# Patient Record
Sex: Female | Born: 1935 | Race: White | Hispanic: No | Smoking: Current every day smoker
Health system: Southern US, Community
[De-identification: ages and names within clinical notes are randomized; demographics above are authoritative.]

## PROBLEM LIST (undated history)

## (undated) DIAGNOSIS — C50919 Malignant neoplasm of unspecified site of unspecified female breast: Secondary | ICD-10-CM

## (undated) DIAGNOSIS — I714 Abdominal aortic aneurysm, without rupture, unspecified: Secondary | ICD-10-CM

## (undated) DIAGNOSIS — G2581 Restless legs syndrome: Secondary | ICD-10-CM

## (undated) DIAGNOSIS — I509 Heart failure, unspecified: Secondary | ICD-10-CM

## (undated) DIAGNOSIS — E119 Type 2 diabetes mellitus without complications: Secondary | ICD-10-CM

## (undated) DIAGNOSIS — G8929 Other chronic pain: Secondary | ICD-10-CM

## (undated) DIAGNOSIS — R7303 Prediabetes: Secondary | ICD-10-CM

## (undated) DIAGNOSIS — I739 Peripheral vascular disease, unspecified: Secondary | ICD-10-CM

## (undated) DIAGNOSIS — D649 Anemia, unspecified: Secondary | ICD-10-CM

## (undated) DIAGNOSIS — J449 Chronic obstructive pulmonary disease, unspecified: Secondary | ICD-10-CM

## (undated) DIAGNOSIS — M549 Dorsalgia, unspecified: Secondary | ICD-10-CM

## (undated) DIAGNOSIS — M1990 Unspecified osteoarthritis, unspecified site: Secondary | ICD-10-CM

## (undated) DIAGNOSIS — I10 Essential (primary) hypertension: Secondary | ICD-10-CM

## (undated) HISTORY — DX: Peripheral vascular disease, unspecified: I73.9

## (undated) HISTORY — DX: Prediabetes: R73.03

## (undated) HISTORY — DX: Other chronic pain: G89.29

## (undated) HISTORY — DX: Restless legs syndrome: G25.81

## (undated) HISTORY — DX: Anemia, unspecified: D64.9

## (undated) HISTORY — PX: TONSILLECTOMY: SUR1361

## (undated) HISTORY — DX: Abdominal aortic aneurysm, without rupture, unspecified: I71.40

## (undated) HISTORY — PX: COLONOSCOPY: SHX174

## (undated) HISTORY — DX: Chronic obstructive pulmonary disease, unspecified: J44.9

## (undated) HISTORY — PX: BREAST SURGERY: SHX581

## (undated) HISTORY — DX: Dorsalgia, unspecified: M54.9

## (undated) HISTORY — PX: MASTECTOMY: SHX3

## (undated) HISTORY — DX: Malignant neoplasm of unspecified site of unspecified female breast: C50.919

## (undated) HISTORY — DX: Unspecified osteoarthritis, unspecified site: M19.90

---
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
## 1998-01-25 ENCOUNTER — Ambulatory Visit (HOSPITAL_COMMUNITY): Admission: RE | Admit: 1998-01-25 | Discharge: 1998-01-25 | Payer: Self-pay | Admitting: Gastroenterology

## 1999-07-10 ENCOUNTER — Encounter: Payer: Self-pay | Admitting: Family Medicine

## 1999-07-10 ENCOUNTER — Encounter: Admission: RE | Admit: 1999-07-10 | Discharge: 1999-07-10 | Payer: Self-pay | Admitting: Family Medicine

## 2000-10-08 ENCOUNTER — Encounter: Admission: RE | Admit: 2000-10-08 | Discharge: 2000-10-08 | Payer: Self-pay | Admitting: Family Medicine

## 2000-10-08 ENCOUNTER — Encounter: Payer: Self-pay | Admitting: Family Medicine

## 2001-10-22 ENCOUNTER — Encounter: Admission: RE | Admit: 2001-10-22 | Discharge: 2001-10-22 | Payer: Self-pay | Admitting: Family Medicine

## 2001-10-22 ENCOUNTER — Encounter: Payer: Self-pay | Admitting: Family Medicine

## 2001-10-29 ENCOUNTER — Encounter: Payer: Self-pay | Admitting: Family Medicine

## 2001-10-29 ENCOUNTER — Encounter: Admission: RE | Admit: 2001-10-29 | Discharge: 2001-10-29 | Payer: Self-pay | Admitting: Family Medicine

## 2002-05-26 ENCOUNTER — Encounter: Admission: RE | Admit: 2002-05-26 | Discharge: 2002-05-26 | Payer: Self-pay | Admitting: Family Medicine

## 2002-05-26 ENCOUNTER — Encounter: Payer: Self-pay | Admitting: Family Medicine

## 2003-01-25 ENCOUNTER — Encounter: Admission: RE | Admit: 2003-01-25 | Discharge: 2003-01-25 | Payer: Self-pay | Admitting: Family Medicine

## 2004-06-19 ENCOUNTER — Encounter: Admission: RE | Admit: 2004-06-19 | Discharge: 2004-06-19 | Payer: Self-pay | Admitting: Family Medicine

## 2005-02-26 HISTORY — PX: MASTECTOMY: SHX3

## 2005-09-04 ENCOUNTER — Encounter: Admission: RE | Admit: 2005-09-04 | Discharge: 2005-09-04 | Payer: Self-pay | Admitting: Family Medicine

## 2005-09-13 ENCOUNTER — Encounter: Admission: RE | Admit: 2005-09-13 | Discharge: 2005-09-13 | Payer: Self-pay | Admitting: Family Medicine

## 2005-09-24 ENCOUNTER — Encounter (INDEPENDENT_AMBULATORY_CARE_PROVIDER_SITE_OTHER): Payer: Self-pay | Admitting: *Deleted

## 2005-09-24 ENCOUNTER — Encounter: Admission: RE | Admit: 2005-09-24 | Discharge: 2005-09-24 | Payer: Self-pay | Admitting: Family Medicine

## 2005-09-24 ENCOUNTER — Encounter (INDEPENDENT_AMBULATORY_CARE_PROVIDER_SITE_OTHER): Payer: Self-pay | Admitting: Diagnostic Radiology

## 2005-10-02 ENCOUNTER — Encounter: Admission: RE | Admit: 2005-10-02 | Discharge: 2005-10-02 | Payer: Self-pay | Admitting: Family Medicine

## 2005-10-30 ENCOUNTER — Encounter (INDEPENDENT_AMBULATORY_CARE_PROVIDER_SITE_OTHER): Payer: Self-pay | Admitting: Specialist

## 2005-10-30 ENCOUNTER — Ambulatory Visit (HOSPITAL_COMMUNITY): Admission: RE | Admit: 2005-10-30 | Discharge: 2005-10-31 | Payer: Self-pay | Admitting: General Surgery

## 2005-11-07 ENCOUNTER — Ambulatory Visit: Payer: Self-pay | Admitting: Hematology & Oncology

## 2006-01-13 ENCOUNTER — Ambulatory Visit: Payer: Self-pay | Admitting: Hematology & Oncology

## 2006-01-16 LAB — CBC WITH DIFFERENTIAL/PLATELET
BASO%: 0.4 % (ref 0.0–2.0)
EOS%: 0.9 % (ref 0.0–7.0)
HCT: 41.9 % (ref 34.8–46.6)
LYMPH%: 30.8 % (ref 14.0–48.0)
MCH: 32.7 pg (ref 26.0–34.0)
MCHC: 33.1 g/dL (ref 32.0–36.0)
NEUT%: 59.1 % (ref 39.6–76.8)
Platelets: 329 10*3/uL (ref 145–400)

## 2006-01-16 LAB — COMPREHENSIVE METABOLIC PANEL
ALT: 14 U/L (ref 0–35)
CO2: 32 mEq/L (ref 19–32)
Creatinine, Ser: 1.61 mg/dL — ABNORMAL HIGH (ref 0.40–1.20)
Total Bilirubin: 0.3 mg/dL (ref 0.3–1.2)

## 2006-01-23 ENCOUNTER — Encounter: Admission: RE | Admit: 2006-01-23 | Discharge: 2006-01-23 | Payer: Self-pay | Admitting: Family Medicine

## 2006-06-14 ENCOUNTER — Ambulatory Visit: Payer: Self-pay | Admitting: Hematology & Oncology

## 2006-06-19 LAB — CBC WITH DIFFERENTIAL/PLATELET
BASO%: 0.4 % (ref 0.0–2.0)
Basophils Absolute: 0 10*3/uL (ref 0.0–0.1)
Eosinophils Absolute: 0.1 10*3/uL (ref 0.0–0.5)
HCT: 41.2 % (ref 34.8–46.6)
HGB: 14.7 g/dL (ref 11.6–15.9)
LYMPH%: 25.7 % (ref 14.0–48.0)
MCHC: 35.6 g/dL (ref 32.0–36.0)
MONO#: 0.6 10*3/uL (ref 0.1–0.9)
NEUT%: 64.8 % (ref 39.6–76.8)
Platelets: 301 10*3/uL (ref 145–400)
WBC: 8.3 10*3/uL (ref 3.9–10.0)

## 2006-06-19 LAB — COMPREHENSIVE METABOLIC PANEL
BUN: 20 mg/dL (ref 6–23)
CO2: 27 mEq/L (ref 19–32)
Calcium: 9.9 mg/dL (ref 8.4–10.5)
Creatinine, Ser: 0.89 mg/dL (ref 0.40–1.20)
Glucose, Bld: 110 mg/dL — ABNORMAL HIGH (ref 70–99)
Total Bilirubin: 0.4 mg/dL (ref 0.3–1.2)

## 2006-08-13 ENCOUNTER — Ambulatory Visit: Payer: Self-pay | Admitting: Hematology & Oncology

## 2006-09-23 ENCOUNTER — Encounter: Admission: RE | Admit: 2006-09-23 | Discharge: 2006-09-23 | Payer: Self-pay | Admitting: Family Medicine

## 2006-11-13 ENCOUNTER — Ambulatory Visit: Payer: Self-pay | Admitting: Hematology & Oncology

## 2006-11-15 LAB — CBC WITH DIFFERENTIAL/PLATELET
Eosinophils Absolute: 0.1 10*3/uL (ref 0.0–0.5)
HCT: 41.2 % (ref 34.8–46.6)
HGB: 14.7 g/dL (ref 11.6–15.9)
LYMPH%: 26.8 % (ref 14.0–48.0)
MONO#: 0.6 10*3/uL (ref 0.1–0.9)
NEUT#: 5.5 10*3/uL (ref 1.5–6.5)
NEUT%: 65.1 % (ref 39.6–76.8)
Platelets: 233 10*3/uL (ref 145–400)
WBC: 8.5 10*3/uL (ref 3.9–10.0)
lymph#: 2.3 10*3/uL (ref 0.9–3.3)

## 2006-11-15 LAB — COMPREHENSIVE METABOLIC PANEL
CO2: 28 mEq/L (ref 19–32)
Calcium: 9.8 mg/dL (ref 8.4–10.5)
Chloride: 105 mEq/L (ref 96–112)
Creatinine, Ser: 0.99 mg/dL (ref 0.40–1.20)
Glucose, Bld: 169 mg/dL — ABNORMAL HIGH (ref 70–99)
Total Bilirubin: 0.5 mg/dL (ref 0.3–1.2)
Total Protein: 6.7 g/dL (ref 6.0–8.3)

## 2007-02-12 ENCOUNTER — Ambulatory Visit: Payer: Self-pay | Admitting: Hematology & Oncology

## 2007-02-14 LAB — CBC WITH DIFFERENTIAL/PLATELET
BASO%: 0.3 % (ref 0.0–2.0)
Eosinophils Absolute: 0.1 10*3/uL (ref 0.0–0.5)
LYMPH%: 26 % (ref 14.0–48.0)
MCHC: 35.5 g/dL (ref 32.0–36.0)
MONO#: 0.7 10*3/uL (ref 0.1–0.9)
NEUT#: 4.7 10*3/uL (ref 1.5–6.5)
Platelets: 271 10*3/uL (ref 145–400)
RBC: 4.15 10*6/uL (ref 3.70–5.32)
WBC: 7.5 10*3/uL (ref 3.9–10.0)
lymph#: 1.9 10*3/uL (ref 0.9–3.3)

## 2007-02-14 LAB — COMPREHENSIVE METABOLIC PANEL
ALT: 13 U/L (ref 0–35)
Albumin: 4.3 g/dL (ref 3.5–5.2)
CO2: 24 mEq/L (ref 19–32)
Calcium: 9.8 mg/dL (ref 8.4–10.5)
Chloride: 106 mEq/L (ref 96–112)
Glucose, Bld: 112 mg/dL — ABNORMAL HIGH (ref 70–99)
Potassium: 4.9 mEq/L (ref 3.5–5.3)
Sodium: 141 mEq/L (ref 135–145)
Total Bilirubin: 0.6 mg/dL (ref 0.3–1.2)
Total Protein: 6.8 g/dL (ref 6.0–8.3)

## 2007-02-27 HISTORY — PX: BACK SURGERY: SHX140

## 2007-06-12 ENCOUNTER — Encounter: Admission: RE | Admit: 2007-06-12 | Discharge: 2007-06-12 | Payer: Self-pay | Admitting: Family Medicine

## 2007-06-18 ENCOUNTER — Ambulatory Visit: Payer: Self-pay | Admitting: Hematology & Oncology

## 2007-06-20 LAB — CBC WITH DIFFERENTIAL/PLATELET
BASO%: 0.6 % (ref 0.0–2.0)
EOS%: 1 % (ref 0.0–7.0)
HCT: 41.5 % (ref 34.8–46.6)
LYMPH%: 27.6 % (ref 14.0–48.0)
MCH: 34.6 pg — ABNORMAL HIGH (ref 26.0–34.0)
MCHC: 35.7 g/dL (ref 32.0–36.0)
NEUT%: 63.7 % (ref 39.6–76.8)
Platelets: 284 10*3/uL (ref 145–400)
lymph#: 2.2 10*3/uL (ref 0.9–3.3)

## 2007-06-20 LAB — COMPREHENSIVE METABOLIC PANEL
ALT: 13 U/L (ref 0–35)
AST: 13 U/L (ref 0–37)
Creatinine, Ser: 0.87 mg/dL (ref 0.40–1.20)
Total Bilirubin: 0.5 mg/dL (ref 0.3–1.2)

## 2007-07-10 ENCOUNTER — Encounter: Admission: RE | Admit: 2007-07-10 | Discharge: 2007-07-10 | Payer: Self-pay | Admitting: Family Medicine

## 2007-08-15 ENCOUNTER — Encounter (INDEPENDENT_AMBULATORY_CARE_PROVIDER_SITE_OTHER): Payer: Self-pay | Admitting: Neurosurgery

## 2007-08-15 ENCOUNTER — Ambulatory Visit (HOSPITAL_COMMUNITY): Admission: RE | Admit: 2007-08-15 | Discharge: 2007-08-16 | Payer: Self-pay | Admitting: Neurosurgery

## 2007-09-25 ENCOUNTER — Encounter: Admission: RE | Admit: 2007-09-25 | Discharge: 2007-09-25 | Payer: Self-pay | Admitting: Surgery

## 2007-10-03 ENCOUNTER — Encounter: Admission: RE | Admit: 2007-10-03 | Discharge: 2007-10-03 | Payer: Self-pay | Admitting: Surgery

## 2007-10-16 ENCOUNTER — Ambulatory Visit: Payer: Self-pay | Admitting: Hematology & Oncology

## 2007-10-20 LAB — CBC WITH DIFFERENTIAL (CANCER CENTER ONLY)
BASO#: 0.1 10*3/uL (ref 0.0–0.2)
BASO%: 1 % (ref 0.0–2.0)
EOS%: 1.1 % (ref 0.0–7.0)
Eosinophils Absolute: 0.1 10*3/uL (ref 0.0–0.5)
HCT: 42 % (ref 34.8–46.6)
HGB: 14.4 g/dL (ref 11.6–15.9)
LYMPH#: 3.1 10*3/uL (ref 0.9–3.3)
LYMPH%: 31.8 % (ref 14.0–48.0)
MCH: 34.8 pg — ABNORMAL HIGH (ref 26.0–34.0)
MCHC: 34.3 g/dL (ref 32.0–36.0)
MCV: 101 fL (ref 81–101)
MONO#: 0.9 10*3/uL (ref 0.1–0.9)
MONO%: 8.7 % (ref 0.0–13.0)
NEUT#: 5.6 10*3/uL (ref 1.5–6.5)
NEUT%: 57.4 % (ref 39.6–80.0)
Platelets: 304 10*3/uL (ref 145–400)
RBC: 4.14 10*6/uL (ref 3.70–5.32)
RDW: 10.5 % (ref 10.5–14.6)
WBC: 9.8 10*3/uL (ref 3.9–10.0)

## 2007-10-20 LAB — COMPREHENSIVE METABOLIC PANEL
ALT: 12 U/L (ref 0–35)
AST: 10 U/L (ref 0–37)
Calcium: 9.9 mg/dL (ref 8.4–10.5)
Chloride: 104 mEq/L (ref 96–112)
Creatinine, Ser: 0.82 mg/dL (ref 0.40–1.20)
Sodium: 140 mEq/L (ref 135–145)
Total Protein: 6.5 g/dL (ref 6.0–8.3)

## 2008-01-20 ENCOUNTER — Encounter: Admission: RE | Admit: 2008-01-20 | Discharge: 2008-01-20 | Payer: Self-pay | Admitting: Family Medicine

## 2008-02-06 ENCOUNTER — Ambulatory Visit: Payer: Self-pay | Admitting: Hematology & Oncology

## 2008-02-09 LAB — CBC WITH DIFFERENTIAL (CANCER CENTER ONLY)
BASO#: 0.1 10*3/uL (ref 0.0–0.2)
HCT: 43.9 % (ref 34.8–46.6)
HGB: 14.9 g/dL (ref 11.6–15.9)
LYMPH#: 2.1 10*3/uL (ref 0.9–3.3)
LYMPH%: 23.9 % (ref 14.0–48.0)
MCHC: 33.9 g/dL (ref 32.0–36.0)
MCV: 101 fL (ref 81–101)
MONO#: 0.6 10*3/uL (ref 0.1–0.9)
NEUT%: 66.7 % (ref 39.6–80.0)
WBC: 8.8 10*3/uL (ref 3.9–10.0)

## 2008-02-09 LAB — COMPREHENSIVE METABOLIC PANEL
AST: 12 U/L (ref 0–37)
Albumin: 4.2 g/dL (ref 3.5–5.2)
BUN: 16 mg/dL (ref 6–23)
CO2: 25 mEq/L (ref 19–32)
Calcium: 9.7 mg/dL (ref 8.4–10.5)
Chloride: 105 mEq/L (ref 96–112)
Glucose, Bld: 123 mg/dL — ABNORMAL HIGH (ref 70–99)
Potassium: 4.9 mEq/L (ref 3.5–5.3)

## 2008-07-30 ENCOUNTER — Ambulatory Visit: Payer: Self-pay | Admitting: Hematology & Oncology

## 2008-08-02 LAB — COMPREHENSIVE METABOLIC PANEL
AST: 16 U/L (ref 0–37)
Albumin: 4.5 g/dL (ref 3.5–5.2)
Alkaline Phosphatase: 49 U/L (ref 39–117)
BUN: 17 mg/dL (ref 6–23)
Creatinine, Ser: 0.95 mg/dL (ref 0.40–1.20)
Glucose, Bld: 221 mg/dL — ABNORMAL HIGH (ref 70–99)
Total Bilirubin: 0.4 mg/dL (ref 0.3–1.2)

## 2008-08-02 LAB — CBC WITH DIFFERENTIAL (CANCER CENTER ONLY)
BASO%: 0.5 % (ref 0.0–2.0)
Eosinophils Absolute: 0.1 10*3/uL (ref 0.0–0.5)
HCT: 44.4 % (ref 34.8–46.6)
LYMPH#: 2.3 10*3/uL (ref 0.9–3.3)
MCV: 102 fL — ABNORMAL HIGH (ref 81–101)
MONO#: 0.5 10*3/uL (ref 0.1–0.9)
NEUT%: 57.7 % (ref 39.6–80.0)
RBC: 4.35 10*6/uL (ref 3.70–5.32)
RDW: 10.4 % — ABNORMAL LOW (ref 10.5–14.6)
WBC: 6.9 10*3/uL (ref 3.9–10.0)

## 2008-10-19 ENCOUNTER — Encounter: Admission: RE | Admit: 2008-10-19 | Discharge: 2008-10-19 | Payer: Self-pay | Admitting: Surgery

## 2008-12-02 ENCOUNTER — Encounter: Admission: RE | Admit: 2008-12-02 | Discharge: 2008-12-02 | Payer: Self-pay | Admitting: Family Medicine

## 2008-12-07 ENCOUNTER — Encounter: Admission: RE | Admit: 2008-12-07 | Discharge: 2008-12-07 | Payer: Self-pay | Admitting: Family Medicine

## 2009-02-04 ENCOUNTER — Ambulatory Visit: Payer: Self-pay | Admitting: Hematology & Oncology

## 2009-02-07 LAB — CBC WITH DIFFERENTIAL (CANCER CENTER ONLY)
BASO%: 0.6 % (ref 0.0–2.0)
Eosinophils Absolute: 0.1 10*3/uL (ref 0.0–0.5)
LYMPH#: 2 10*3/uL (ref 0.9–3.3)
LYMPH%: 23.3 % (ref 14.0–48.0)
MCV: 100 fL (ref 81–101)
MONO#: 0.5 10*3/uL (ref 0.1–0.9)
NEUT#: 5.8 10*3/uL (ref 1.5–6.5)
Platelets: 287 10*3/uL (ref 145–400)
RBC: 4.28 10*6/uL (ref 3.70–5.32)
RDW: 10.3 % — ABNORMAL LOW (ref 10.5–14.6)
WBC: 8.4 10*3/uL (ref 3.9–10.0)

## 2009-02-08 ENCOUNTER — Inpatient Hospital Stay (HOSPITAL_BASED_OUTPATIENT_CLINIC_OR_DEPARTMENT_OTHER): Admission: RE | Admit: 2009-02-08 | Discharge: 2009-02-08 | Payer: Self-pay | Admitting: Interventional Cardiology

## 2009-02-08 LAB — COMPREHENSIVE METABOLIC PANEL
AST: 12 U/L (ref 0–37)
Albumin: 4.5 g/dL (ref 3.5–5.2)
Alkaline Phosphatase: 53 U/L (ref 39–117)
Calcium: 10 mg/dL (ref 8.4–10.5)
Chloride: 105 mEq/L (ref 96–112)
Potassium: 4.5 mEq/L (ref 3.5–5.3)
Sodium: 140 mEq/L (ref 135–145)
Total Protein: 6.9 g/dL (ref 6.0–8.3)

## 2009-08-10 ENCOUNTER — Ambulatory Visit: Payer: Self-pay | Admitting: Hematology & Oncology

## 2009-08-11 LAB — CBC WITH DIFFERENTIAL (CANCER CENTER ONLY)
EOS%: 2.1 % (ref 0.0–7.0)
HGB: 14.5 g/dL (ref 11.6–15.9)
LYMPH#: 2.8 10*3/uL (ref 0.9–3.3)
MCH: 34.7 pg — ABNORMAL HIGH (ref 26.0–34.0)
MCHC: 34.5 g/dL (ref 32.0–36.0)
MCV: 101 fL (ref 81–101)
WBC: 7.7 10*3/uL (ref 3.9–10.0)

## 2009-08-12 LAB — VITAMIN D 25 HYDROXY (VIT D DEFICIENCY, FRACTURES): Vit D, 25-Hydroxy: 30 ng/mL (ref 30–89)

## 2009-08-12 LAB — COMPREHENSIVE METABOLIC PANEL
AST: 16 U/L (ref 0–37)
CO2: 27 mEq/L (ref 19–32)
Chloride: 107 mEq/L (ref 96–112)
Sodium: 143 mEq/L (ref 135–145)
Total Protein: 6.5 g/dL (ref 6.0–8.3)

## 2009-10-21 ENCOUNTER — Encounter: Admission: RE | Admit: 2009-10-21 | Discharge: 2009-10-21 | Payer: Self-pay | Admitting: Surgery

## 2009-11-07 ENCOUNTER — Encounter: Admission: RE | Admit: 2009-11-07 | Discharge: 2009-11-07 | Payer: Self-pay | Admitting: Surgery

## 2009-12-29 ENCOUNTER — Ambulatory Visit (HOSPITAL_COMMUNITY): Admission: RE | Admit: 2009-12-29 | Discharge: 2009-12-29 | Payer: Self-pay | Admitting: Gastroenterology

## 2010-01-25 ENCOUNTER — Ambulatory Visit: Payer: Self-pay | Admitting: Hematology & Oncology

## 2010-01-26 ENCOUNTER — Ambulatory Visit (HOSPITAL_BASED_OUTPATIENT_CLINIC_OR_DEPARTMENT_OTHER)
Admission: RE | Admit: 2010-01-26 | Discharge: 2010-01-26 | Payer: Self-pay | Source: Home / Self Care | Admitting: Hematology & Oncology

## 2010-03-19 ENCOUNTER — Encounter: Payer: Self-pay | Admitting: Family Medicine

## 2010-03-20 ENCOUNTER — Encounter: Payer: Self-pay | Admitting: Surgery

## 2010-05-09 LAB — GLUCOSE, CAPILLARY: Glucose-Capillary: 110 mg/dL — ABNORMAL HIGH (ref 70–99)

## 2010-06-27 ENCOUNTER — Encounter (INDEPENDENT_AMBULATORY_CARE_PROVIDER_SITE_OTHER): Payer: Self-pay | Admitting: Surgery

## 2010-07-11 NOTE — Op Note (Signed)
Linda Ray, Linda Ray                  ACCOUNT NO.:  [id number]   MEDICAL RECORD NO.:  [id number]          PATIENT TYPE:  INP   LOCATION:  3537                         FACILITY:  [facility]   PHYSICIAN:  Danae Orleans. Venetia Maxon, M.D.  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   POSTOPERATIVE DIAGNOSES:  Left L4-5 synovial cyst with spondylolisthesis  with stenosis and radiculopathy.   PROCEDURE:  Left L4-5 laminotomy with resection of synovial cyst and  microdissection.   SURGEON:  Danae Orleans. Venetia Maxon, MD   ASSISTANT:  Georgiann Cocker, RN   ANESTHESIA:  General endotracheal anesthesia.   ESTIMATED BLOOD LOSS:  Minimal.   COMPLICATIONS:  None.   DISPOSITION:  Recovery.   INDICATIONS:  Linda Ray is a 76 year old woman with left L4-5 synovial  cyst with significant radiculopathy.  It was elected to take her to  surgery for resection of this synovial cyst.   PROCEDURE IN DETAIL:  Ms. Qazi was brought to the operating room.  Following satisfactory and uncomplicated induction of general  endotracheal anesthesia and placement of intravenous lines, the patient  was placed in a prone position on the Wilson frame.  Her low back was  prepped and draped in usual sterile fashion.  Area of planned incision  was infiltrated with 0.25% Marcaine and 0.5% lidocaine with 1:200,000  epinephrine.  Incision was made in the midline, carried through the  lumbodorsal fascia and was incised sharply on the left side of midline.  Subperiosteal dissection was performed.  Marker probes were placed over  what were felt to be L3-4 and L4-5 levels, and this was confirmed on  intraoperative x-ray.  Subsequently, a hemilaminectomy of L4 was  performed with high-speed drill and completed with Kerrison rongeurs,  and there was what  appeared to be sebaceous-like material which was  expressed from the cystic degeneration of the ligamentum flavum and also  synovial cyst fluid.  The ligamentum flavum was detached and removed in  piecemeal fashion.  A generous laminotomy was performed and foraminotomy  was performed overlying the left L5 nerve root and lateral thecal sac.  Microscope was brought into field using microdissection technique.  The  lateral recess was decompressed.  Thecal sac was decompressed as well as  the lateral aspect of the nerve root.  There was a fair amount of  densely adherent cyst lining which was along the L5 nerve root, but it  was felt that the nerve root was well decompressed.  It was not  necessary to remove this in its entirety and that the nerve root was  adequately decompressed.  There was a variety of up-angled curettes were  used to remove additional degenerated ligamentous material.  The wound  was then irrigated and bathed in Depo-Medrol and fentanyl.  The  lumbodorsal fascia was closed with 0 Vicryl sutures, subcutaneous  tissues were approximated with 2-0 Vicryl interrupted inverted sutures,  and skin edges  were approximated  interrupted 3-0 Vicryl subcuticular stitch.  The wound was dressed with  Dermabond.  The patient was extubated in the operating room and taken to  recovery room in stable satisfactory condition having tolerated the  operation well.  Counts were correct at the end of the case.      Danae Orleans. Venetia Maxon, M.D.  Electronically Signed     JDS/MEDQ  D:  08/15/2007  T:  08/16/2007  Job:  [id number]

## 2010-07-14 NOTE — Op Note (Signed)
NAMEMARKISHA, MEDING                  ACCOUNT NO.:  [id number]   MEDICAL RECORD NO.:  [id number]          PATIENT TYPE:  OIB   LOCATION:  2550                         FACILITY:  [facility]   PHYSICIAN:  Rose Phi. Maple Hudson, M.D.   DATE OF BIRTH:  1934/11/26   DATE OF PROCEDURE:  10/30/2005  DATE OF DISCHARGE:                                 OPERATIVE REPORT   PREOPERATIVE DIAGNOSIS:  Stage-I carcinoma of the left breast.   POSTOPERATIVE DIAGNOSIS:  Stage-I carcinoma of the left breast.   OPERATION:  1. Blue dye injection.  2. Left axillary sentinel lymph node biopsy.  3. Left total mastectomy.   SURGEON:  Rose Phi. Young, MD.   ANESTHESIA:  General.   OPERATIVE PROCEDURE:  Prior to coming to the operating room, 1 mCi of  technetium sulfur colloid was injected intradermally.   After suitable general anesthesia was induced, the patient was placed in the  supine position with arms extended on the arm board.  Then 5 cc of a mixture  of 2 cc of methylene blue and 3 cc of injectable saline were injected in the  subareolar tissue of the left breast and the breast gently massaged for 3  minutes.  We then prepped and draped the breast and axilla.   A short transverse axillary incision was made with dissection through the  subcutaneous tissue to the clavipectoral fascia.  Deep to the fascia was 1  blue and hot lymph node.  That was submitted as a sentinel node for the  evaluation plus the OSNA study.  There were no other blue, hot or palpable  nodes.   While that was being evaluated, transverse elliptical incisions were then  outlined on the __________  breast, incorporating the nipple-areolar  complex.  Incisions were made, and with the cautery, the flaps were  dissected in the standard fashion going superiorly to near the clavicle and  medially to the medial border of the sternum and inferiorly to the  inframammary fold at the level of the rectus fascia and laterally to the  latissimus dorsi  muscle.   We then removed the breast by dissecting it from off of the pectoralis major  muscle, incorporating the pectoralis fascia.  As we got to the lateral  margin of the breast, the report from the pathologist confirmed that the  sentinel node was negative for metastatic disease.  We then terminated the  mastectomy at that level.  No axillary dissection was done.   We obtained good hemostasis using the cautery.  We thoroughly irrigated the  field with saline.  One 19-French Harrison Mons drain was inserted and brought out  through a separate stab wound.   Both incisions were then closed with staples and dressings applied, and the  patient was transferred to the recovery room in satisfactory condition  having tolerated the procedure well.      Rose Phi. Maple Hudson, M.D.  Electronically Signed     PRY/MEDQ  D:  10/30/2005  T:  10/30/2005  Job:  [id number]

## 2010-08-07 ENCOUNTER — Encounter (HOSPITAL_BASED_OUTPATIENT_CLINIC_OR_DEPARTMENT_OTHER): Payer: Medicare Other | Admitting: Hematology & Oncology

## 2010-08-07 ENCOUNTER — Other Ambulatory Visit: Payer: Self-pay | Admitting: Hematology & Oncology

## 2010-08-07 DIAGNOSIS — J410 Simple chronic bronchitis: Secondary | ICD-10-CM

## 2010-08-07 DIAGNOSIS — Z170 Estrogen receptor positive status [ER+]: Secondary | ICD-10-CM

## 2010-08-07 DIAGNOSIS — C50419 Malignant neoplasm of upper-outer quadrant of unspecified female breast: Secondary | ICD-10-CM

## 2010-08-07 DIAGNOSIS — F17200 Nicotine dependence, unspecified, uncomplicated: Secondary | ICD-10-CM

## 2010-08-07 LAB — CBC WITH DIFFERENTIAL (CANCER CENTER ONLY)
BASO#: 0 10*3/uL (ref 0.0–0.2)
Eosinophils Absolute: 0.2 10*3/uL (ref 0.0–0.5)
HGB: 14.5 g/dL (ref 11.6–15.9)
LYMPH%: 24.7 % (ref 14.0–48.0)
MCH: 33.5 pg (ref 26.0–34.0)
MCV: 97 fL (ref 81–101)
MONO#: 0.7 10*3/uL (ref 0.1–0.9)
MONO%: 9.6 % (ref 0.0–13.0)
Platelets: 251 10*3/uL (ref 145–400)
RBC: 4.33 10*6/uL (ref 3.70–5.32)
WBC: 7 10*3/uL (ref 3.9–10.0)

## 2010-08-07 LAB — COMPREHENSIVE METABOLIC PANEL
Albumin: 4.5 g/dL (ref 3.5–5.2)
Alkaline Phosphatase: 47 U/L (ref 39–117)
BUN: 19 mg/dL (ref 6–23)
CO2: 26 mEq/L (ref 19–32)
Glucose, Bld: 121 mg/dL — ABNORMAL HIGH (ref 70–99)
Potassium: 4.7 mEq/L (ref 3.5–5.3)
Sodium: 140 mEq/L (ref 135–145)
Total Bilirubin: 0.4 mg/dL (ref 0.3–1.2)
Total Protein: 6.6 g/dL (ref 6.0–8.3)

## 2010-10-02 ENCOUNTER — Other Ambulatory Visit (INDEPENDENT_AMBULATORY_CARE_PROVIDER_SITE_OTHER): Payer: Self-pay | Admitting: Surgery

## 2010-10-02 DIAGNOSIS — Z1231 Encounter for screening mammogram for malignant neoplasm of breast: Secondary | ICD-10-CM

## 2010-10-24 ENCOUNTER — Ambulatory Visit
Admission: RE | Admit: 2010-10-24 | Discharge: 2010-10-24 | Disposition: A | Discharge status: Home / Self Care | Payer: Medicare Other | Source: Ambulatory Visit | Attending: Surgery | Admitting: Surgery

## 2010-10-24 DIAGNOSIS — Z1231 Encounter for screening mammogram for malignant neoplasm of breast: Secondary | ICD-10-CM

## 2010-11-08 ENCOUNTER — Encounter (INDEPENDENT_AMBULATORY_CARE_PROVIDER_SITE_OTHER): Payer: Self-pay | Admitting: Surgery

## 2010-11-23 LAB — BASIC METABOLIC PANEL
Calcium: 10
Creatinine, Ser: 0.81
GFR calc Af Amer: >60

## 2010-11-23 LAB — CBC
RBC: 4.3
WBC: 7.3

## 2010-11-29 ENCOUNTER — Ambulatory Visit (INDEPENDENT_AMBULATORY_CARE_PROVIDER_SITE_OTHER): Payer: Self-pay | Admitting: Surgery

## 2010-12-01 ENCOUNTER — Encounter (INDEPENDENT_AMBULATORY_CARE_PROVIDER_SITE_OTHER): Payer: Self-pay | Admitting: Surgery

## 2010-12-05 ENCOUNTER — Ambulatory Visit (INDEPENDENT_AMBULATORY_CARE_PROVIDER_SITE_OTHER): Payer: Medicare Other | Admitting: Surgery

## 2010-12-05 ENCOUNTER — Encounter (INDEPENDENT_AMBULATORY_CARE_PROVIDER_SITE_OTHER): Payer: Self-pay | Admitting: Surgery

## 2010-12-05 VITALS — BP 138/80 | HR 76 | Temp 98.2°F | Resp 12 | Ht 62.0 in | Wt 132.6 lb

## 2010-12-05 DIAGNOSIS — C50919 Malignant neoplasm of unspecified site of unspecified female breast: Secondary | ICD-10-CM

## 2010-12-05 DIAGNOSIS — C50912 Malignant neoplasm of unspecified site of left female breast: Secondary | ICD-10-CM | POA: Insufficient documentation

## 2010-12-05 NOTE — Progress Notes (Signed)
Long-term followup of a T2 N0 lobular carcinoma of the left breast status post left mastectomy. She had a mammogram performed 10/26/10 which was normal. Last year she had a MRI of this breast due to some possible nipple retraction which was unremarkable. Her physical examination today shows no nodules along the left mastectomy site. The right breast shows no palpable masses. No axillary lymphadenopathy. No further nipple retraction. No palpable masses or nipple discharge.  Patient plans to followup annually with Dr. Myna Hidalgo. We will be glad to see her back on a p.r.n. basis.

## 2011-04-25 ENCOUNTER — Other Ambulatory Visit: Payer: Self-pay | Admitting: Endocrinology

## 2011-04-25 DIAGNOSIS — E041 Nontoxic single thyroid nodule: Secondary | ICD-10-CM

## 2011-05-03 ENCOUNTER — Other Ambulatory Visit: Payer: Medicare Other

## 2011-05-08 ENCOUNTER — Inpatient Hospital Stay
Admission: RE | Admit: 2011-05-08 | Discharge: 2011-05-08 | Payer: Medicare Other | Source: Ambulatory Visit | Attending: Endocrinology | Admitting: Endocrinology

## 2011-05-16 ENCOUNTER — Other Ambulatory Visit (HOSPITAL_COMMUNITY)
Admission: RE | Admit: 2011-05-16 | Discharge: 2011-05-16 | Disposition: A | Discharge status: Home / Self Care | Payer: Medicare Other | Source: Ambulatory Visit | Attending: Interventional Radiology | Admitting: Interventional Radiology

## 2011-05-16 ENCOUNTER — Ambulatory Visit
Admission: RE | Admit: 2011-05-16 | Discharge: 2011-05-16 | Disposition: A | Discharge status: Home / Self Care | Payer: Medicare Other | Source: Ambulatory Visit | Attending: Endocrinology | Admitting: Endocrinology

## 2011-05-16 DIAGNOSIS — E049 Nontoxic goiter, unspecified: Secondary | ICD-10-CM | POA: Insufficient documentation

## 2011-05-16 DIAGNOSIS — E041 Nontoxic single thyroid nodule: Secondary | ICD-10-CM

## 2011-08-07 ENCOUNTER — Telehealth: Payer: Self-pay | Admitting: Hematology & Oncology

## 2011-08-07 NOTE — Telephone Encounter (Signed)
Pt called and cx 08/08/11 apt and stated she will call back to resch

## 2011-08-08 ENCOUNTER — Ambulatory Visit: Payer: Medicare Other | Admitting: Hematology & Oncology

## 2011-12-27 ENCOUNTER — Other Ambulatory Visit (INDEPENDENT_AMBULATORY_CARE_PROVIDER_SITE_OTHER): Payer: Self-pay | Admitting: Surgery

## 2011-12-27 DIAGNOSIS — Z1231 Encounter for screening mammogram for malignant neoplasm of breast: Secondary | ICD-10-CM

## 2011-12-27 DIAGNOSIS — Z9012 Acquired absence of left breast and nipple: Secondary | ICD-10-CM

## 2011-12-27 DIAGNOSIS — Z853 Personal history of malignant neoplasm of breast: Secondary | ICD-10-CM

## 2012-02-04 ENCOUNTER — Ambulatory Visit
Admission: RE | Admit: 2012-02-04 | Discharge: 2012-02-04 | Disposition: A | Discharge status: Home / Self Care | Payer: Medicare Other | Source: Ambulatory Visit | Attending: Surgery | Admitting: Surgery

## 2012-02-04 DIAGNOSIS — Z853 Personal history of malignant neoplasm of breast: Secondary | ICD-10-CM

## 2012-02-04 DIAGNOSIS — Z9012 Acquired absence of left breast and nipple: Secondary | ICD-10-CM

## 2012-02-04 DIAGNOSIS — Z1231 Encounter for screening mammogram for malignant neoplasm of breast: Secondary | ICD-10-CM

## 2012-04-23 ENCOUNTER — Other Ambulatory Visit: Payer: Self-pay | Admitting: Endocrinology

## 2012-04-23 DIAGNOSIS — E049 Nontoxic goiter, unspecified: Secondary | ICD-10-CM

## 2012-04-28 ENCOUNTER — Other Ambulatory Visit: Payer: Medicare Other

## 2012-04-30 ENCOUNTER — Ambulatory Visit
Admission: RE | Admit: 2012-04-30 | Discharge: 2012-04-30 | Disposition: A | Discharge status: Home / Self Care | Payer: Medicare Other | Source: Ambulatory Visit | Attending: Endocrinology | Admitting: Endocrinology

## 2012-04-30 DIAGNOSIS — E049 Nontoxic goiter, unspecified: Secondary | ICD-10-CM

## 2013-01-08 ENCOUNTER — Other Ambulatory Visit: Payer: Self-pay

## 2013-01-08 DIAGNOSIS — Z1231 Encounter for screening mammogram for malignant neoplasm of breast: Secondary | ICD-10-CM

## 2013-01-08 DIAGNOSIS — Z9012 Acquired absence of left breast and nipple: Secondary | ICD-10-CM

## 2013-01-08 DIAGNOSIS — Z853 Personal history of malignant neoplasm of breast: Secondary | ICD-10-CM

## 2013-02-12 ENCOUNTER — Ambulatory Visit
Admission: RE | Admit: 2013-02-12 | Discharge: 2013-02-12 | Disposition: A | Discharge status: Home / Self Care | Payer: Medicare Other | Source: Ambulatory Visit

## 2013-02-12 DIAGNOSIS — Z853 Personal history of malignant neoplasm of breast: Secondary | ICD-10-CM

## 2013-02-12 DIAGNOSIS — Z1231 Encounter for screening mammogram for malignant neoplasm of breast: Secondary | ICD-10-CM

## 2013-02-12 DIAGNOSIS — Z9012 Acquired absence of left breast and nipple: Secondary | ICD-10-CM

## 2013-02-20 ENCOUNTER — Other Ambulatory Visit: Payer: Self-pay | Admitting: Nurse Practitioner

## 2013-02-20 DIAGNOSIS — R928 Other abnormal and inconclusive findings on diagnostic imaging of breast: Secondary | ICD-10-CM

## 2013-02-24 ENCOUNTER — Ambulatory Visit
Admission: RE | Admit: 2013-02-24 | Discharge: 2013-02-24 | Disposition: A | Discharge status: Home / Self Care | Payer: Medicare Other | Source: Ambulatory Visit | Attending: Nurse Practitioner | Admitting: Nurse Practitioner

## 2013-02-24 DIAGNOSIS — R928 Other abnormal and inconclusive findings on diagnostic imaging of breast: Secondary | ICD-10-CM

## 2013-04-28 ENCOUNTER — Other Ambulatory Visit: Payer: Self-pay | Admitting: Endocrinology

## 2013-04-28 DIAGNOSIS — E049 Nontoxic goiter, unspecified: Secondary | ICD-10-CM

## 2013-05-01 ENCOUNTER — Ambulatory Visit
Admission: RE | Admit: 2013-05-01 | Discharge: 2013-05-01 | Disposition: A | Discharge status: Home / Self Care | Payer: Medicare Other | Source: Ambulatory Visit | Attending: Endocrinology | Admitting: Endocrinology

## 2013-05-01 DIAGNOSIS — E049 Nontoxic goiter, unspecified: Secondary | ICD-10-CM

## 2013-05-04 ENCOUNTER — Other Ambulatory Visit: Payer: Self-pay | Admitting: Endocrinology

## 2013-05-04 DIAGNOSIS — E049 Nontoxic goiter, unspecified: Secondary | ICD-10-CM

## 2013-07-01 ENCOUNTER — Other Ambulatory Visit: Payer: Self-pay | Admitting: Endocrinology

## 2013-07-01 DIAGNOSIS — E041 Nontoxic single thyroid nodule: Secondary | ICD-10-CM

## 2013-07-08 ENCOUNTER — Other Ambulatory Visit (HOSPITAL_COMMUNITY)
Admission: RE | Admit: 2013-07-08 | Discharge: 2013-07-08 | Disposition: A | Discharge status: Home / Self Care | Payer: Medicare Other | Source: Ambulatory Visit | Attending: Interventional Radiology | Admitting: Interventional Radiology

## 2013-07-08 ENCOUNTER — Ambulatory Visit
Admission: RE | Admit: 2013-07-08 | Discharge: 2013-07-08 | Disposition: A | Discharge status: Home / Self Care | Payer: Medicare Other | Source: Ambulatory Visit | Attending: Endocrinology | Admitting: Endocrinology

## 2013-07-08 DIAGNOSIS — E041 Nontoxic single thyroid nodule: Secondary | ICD-10-CM

## 2013-07-25 LAB — PULMONARY FUNCTION TEST
DL/VA % pred: 114 %
DL/VA: 5.2 ml/min/mmHg/L
DLCO COR: 14.67 ml/min/mmHg
DLCO UNC % PRED: 66 %
DLCO UNC: 14.35 ml/min/mmHg
DLCO cor % pred: 68 %
FEF 25-75 Post: 1.05 L/sec
FEF 25-75 Pre: 0.87 L/sec
FEF2575-%Change-Post: 20 %
FEF2575-%PRED-POST: 78 %
FEF2575-%PRED-PRE: 65 %
FEV1-%CHANGE-POST: 4 %
FEV1-%PRED-PRE: 67 %
FEV1-%Pred-Post: 69 %
FEV1-POST: 1.24 L
FEV1-PRE: 1.19 L
FEV1FVC-%CHANGE-POST: 3 %
FEV1FVC-%PRED-PRE: 100 %
FEV6-%Change-Post: 1 %
FEV6-%PRED-PRE: 70 %
FEV6-%Pred-Post: 71 %
FEV6-POST: 1.62 L
FEV6-Pre: 1.59 L
FEV6FVC-%Change-Post: 1 %
FEV6FVC-%PRED-POST: 106 %
FEV6FVC-%Pred-Pre: 105 %
FVC-%Change-Post: 0 %
FVC-%PRED-PRE: 67 %
FVC-%Pred-Post: 67 %
FVC-PRE: 1.61 L
FVC-Post: 1.62 L
POST FEV6/FVC RATIO: 100 %
PRE FEV6/FVC RATIO: 99 %
Post FEV1/FVC ratio: 77 %
Pre FEV1/FVC ratio: 74 %
RV % PRED: 86 %
RV: 1.98 L
TLC % pred: 81 %
TLC: 3.86 L

## 2013-09-26 ENCOUNTER — Encounter: Payer: Self-pay | Admitting: *Deleted

## 2013-11-03 ENCOUNTER — Other Ambulatory Visit: Payer: Medicare Other

## 2014-02-15 ENCOUNTER — Other Ambulatory Visit: Payer: Self-pay

## 2014-02-15 DIAGNOSIS — Z1231 Encounter for screening mammogram for malignant neoplasm of breast: Secondary | ICD-10-CM

## 2014-02-15 DIAGNOSIS — Z9012 Acquired absence of left breast and nipple: Secondary | ICD-10-CM

## 2014-03-10 ENCOUNTER — Ambulatory Visit
Admission: RE | Admit: 2014-03-10 | Discharge: 2014-03-10 | Disposition: A | Discharge status: Home / Self Care | Payer: Medicare Other | Source: Ambulatory Visit

## 2014-03-10 DIAGNOSIS — Z1231 Encounter for screening mammogram for malignant neoplasm of breast: Secondary | ICD-10-CM

## 2014-03-10 DIAGNOSIS — Z9012 Acquired absence of left breast and nipple: Secondary | ICD-10-CM

## 2014-04-29 ENCOUNTER — Other Ambulatory Visit: Payer: Self-pay | Admitting: Endocrinology

## 2014-04-29 DIAGNOSIS — E049 Nontoxic goiter, unspecified: Secondary | ICD-10-CM

## 2014-07-12 ENCOUNTER — Ambulatory Visit
Admission: RE | Admit: 2014-07-12 | Discharge: 2014-07-12 | Disposition: A | Discharge status: Home / Self Care | Payer: Medicare Other | Source: Ambulatory Visit | Attending: Endocrinology | Admitting: Endocrinology

## 2014-07-12 DIAGNOSIS — E049 Nontoxic goiter, unspecified: Secondary | ICD-10-CM

## 2015-03-01 ENCOUNTER — Other Ambulatory Visit: Payer: Self-pay

## 2015-03-01 DIAGNOSIS — Z1231 Encounter for screening mammogram for malignant neoplasm of breast: Secondary | ICD-10-CM

## 2015-03-22 ENCOUNTER — Ambulatory Visit
Admission: RE | Admit: 2015-03-22 | Discharge: 2015-03-22 | Disposition: A | Discharge status: Home / Self Care | Payer: Medicare Other | Source: Ambulatory Visit

## 2015-03-22 DIAGNOSIS — Z1231 Encounter for screening mammogram for malignant neoplasm of breast: Secondary | ICD-10-CM

## 2015-03-23 ENCOUNTER — Other Ambulatory Visit: Payer: Self-pay | Admitting: Nurse Practitioner

## 2015-03-23 DIAGNOSIS — Z9012 Acquired absence of left breast and nipple: Secondary | ICD-10-CM

## 2015-03-23 DIAGNOSIS — N6459 Other signs and symptoms in breast: Secondary | ICD-10-CM

## 2015-03-29 ENCOUNTER — Ambulatory Visit
Admission: RE | Admit: 2015-03-29 | Discharge: 2015-03-29 | Disposition: A | Discharge status: Home / Self Care | Payer: Medicare Other | Source: Ambulatory Visit | Attending: Nurse Practitioner | Admitting: Nurse Practitioner

## 2015-03-29 DIAGNOSIS — N6459 Other signs and symptoms in breast: Secondary | ICD-10-CM

## 2015-03-29 DIAGNOSIS — Z9012 Acquired absence of left breast and nipple: Secondary | ICD-10-CM

## 2015-05-25 ENCOUNTER — Institutional Professional Consult (permissible substitution): Payer: Medicare Other | Admitting: Internal Medicine

## 2015-05-31 ENCOUNTER — Encounter: Payer: Self-pay | Admitting: Internal Medicine

## 2015-05-31 ENCOUNTER — Ambulatory Visit (INDEPENDENT_AMBULATORY_CARE_PROVIDER_SITE_OTHER): Payer: Medicare Other | Admitting: Internal Medicine

## 2015-05-31 VITALS — BP 130/72 | HR 94 | Ht 61.0 in | Wt 127.2 lb

## 2015-05-31 DIAGNOSIS — R911 Solitary pulmonary nodule: Secondary | ICD-10-CM | POA: Diagnosis not present

## 2015-05-31 DIAGNOSIS — Z720 Tobacco use: Secondary | ICD-10-CM | POA: Diagnosis not present

## 2015-05-31 DIAGNOSIS — Z87891 Personal history of nicotine dependence: Secondary | ICD-10-CM

## 2015-05-31 DIAGNOSIS — R5383 Other fatigue: Secondary | ICD-10-CM | POA: Diagnosis not present

## 2015-05-31 NOTE — Progress Notes (Signed)
Subjective:    Patient ID: Linda Ray, female    DOB: 1934/07/20, 81 y.o.   MRN: [id number] PCP Vicenta Aly, FNP  HPI IOV 05/31/2015  Chief Complaint  Patient presents with  . Pulmonary Consult    Pt referred by Dr. Melford Aase for COPD. Pt states she has intermittent DOE, prod cough with clear mucus. Pt denies CP/tightness.   hx from patient and outside chart  81 year old female lives in [place]. Takes care of her 20 year old mother. She visits with Vicenta Aly of the primary care clinic there. She generally tries to avoid scheduled maintenance visits with primary care. She only sees them acutely. As best as I can gather patient has some nonspecific fatigue. Unclear if it is even worse with exertion or relieved by rest. She does not admit any shortness of breath or cough. She is a heavy smoker. However she does admit to symptoms are consistent with a COPD exacerbation/acute bronchitis. She takes antibodies and prednisone for this at least once a year. This is being her life so for the last few years. She is able to do all her activities of daily living. Somewhere along the way she had a CT scan of the chest. I was able to visualize the CT chest from 04/01/2015 done at [facility]. The official report is that is a 6 mm right lower lobe nodule that has enlarged but there are other smaller nodules are stable. Patient has been referred hereto address his smoking, lung nodule and a fatigue. She says that she is to be taken care of her 8 year old mom. She does not have a tire make frequent trips here. She likes to keep her visits here at a minimum. She is willing to have pulmonary function test and follow-up but only when clubbed with her CT chest in 3 months  Though she denies symptoms when I asked her to do cAT score it looks like she has cough, and fatigue   CAT COPD Symptom & Quality of Life Score ([place] trademark) 0 is no burden. 5 is highest burden 05/31/2015   Never Cough ->  Cough all the time 4  No phlegm in chest -> Chest is full of phlegm 2  No chest tightness -> Chest feels very tight 3  No dyspnea for 1 flight stairs/hill -> Very dyspneic for 1 flight of stairs 3  No limitations for ADL at home -> Very limited with ADL at home 1  Confident leaving home -> Not at all confident leaving home 0  Sleep soundly -> Do not sleep soundly because of lung condition 1  Lots of Energy -> No energy at all 4  TOTAL Score (max 40)  18       has a past medical history of Arthritis; Chronic back pain; RLS (restless legs syndrome); Breast cancer ([place]); and Borderline diabetes.   reports that she has been smoking Cigarettes.  She has a 81.25 pack-year smoking history. She has never used smokeless tobacco.  Past Surgical History  Procedure Laterality Date  . Tonsillectomy    . Abdominal surgery  1954  . Mastectomy  2007  . Back surgery  2009    Allergies  Allergen Reactions  . Codeine   . Diphth-Acell Pertussis-Tetanus Other (See Comments)    Significant local reaction  . Sulfa Drugs Cross Reactors     Immunization History  Administered Date(s) Administered  . Influenza,inj,Quad PF,36+ Mos 11/27/2014  . Pneumococcal Polysaccharide-23 02/26/1998    Family History  Problem Relation  Age of Onset  . Cancer Mother     colon  . Stroke Father   . Cancer Sister     breast, bladder, kidney     Current outpatient prescriptions:  .  albuterol (PROVENTIL HFA;VENTOLIN HFA) 108 (90 Base) MCG/ACT inhaler, Inhale 1-2 puffs into the lungs every 6 (six) hours as needed for wheezing or shortness of breath., Disp: , Rfl:  .  aspirin 81 MG tablet, Take 81 mg by mouth daily.  , Disp: , Rfl:  .  B Complex-C (B-COMPLEX WITH VITAMIN C) tablet, Take 1 tablet by mouth daily., Disp: , Rfl:  .  Cholecalciferol (VITAMIN D) 2000 UNITS tablet, Take 2,000 Units by mouth daily.  , Disp: , Rfl:      Review of Systems  Constitutional: Negative for fever and unexpected weight  change.  HENT: Negative for congestion, dental problem, ear pain, nosebleeds, postnasal drip, rhinorrhea, sinus pressure, sneezing, sore throat and trouble swallowing.   Eyes: Negative for redness and itching.  Respiratory: Positive for cough and shortness of breath. Negative for chest tightness and wheezing.   Cardiovascular: Negative for palpitations and leg swelling.  Gastrointestinal: Negative for nausea and vomiting.  Genitourinary: Negative for dysuria.  Musculoskeletal: Negative for joint swelling.  Skin: Negative for rash.  Neurological: Negative for headaches.  Hematological: Does not bruise/bleed easily.  Psychiatric/Behavioral: Negative for dysphoric mood. The patient is not nervous/anxious.        Objective:   Physical Exam  Constitutional: She is oriented to person, place, and time. She appears well-developed and well-nourished. No distress.  HENT:  Head: Normocephalic and atraumatic.  Right Ear: External ear normal.  Left Ear: External ear normal.  Mouth/Throat: Oropharynx is clear and moist. No oropharyngeal exudate.  Eyes: Conjunctivae and EOM are normal. Pupils are equal, round, and reactive to light. Right eye exhibits no discharge. Left eye exhibits no discharge. No scleral icterus.  Neck: Normal range of motion. Neck supple. No JVD present. No tracheal deviation present. No thyromegaly present.  Cardiovascular: Normal rate, regular rhythm, normal heart sounds and intact distal pulses.  Exam reveals no gallop and no friction rub.   No murmur heard. Pulmonary/Chest: Effort normal and breath sounds normal. No respiratory distress. She has no wheezes. She has no rales. She exhibits no tenderness.  Abdominal: Soft. Bowel sounds are normal. She exhibits no distension and no mass. There is no tenderness. There is no rebound and no guarding.  Musculoskeletal: Normal range of motion. She exhibits no edema or tenderness.  Lymphadenopathy:    She has no cervical adenopathy.    Neurological: She is alert and oriented to person, place, and time. She has normal reflexes. No cranial nerve deficit. She exhibits normal muscle tone. Coordination normal.  Skin: Skin is warm and dry. No rash noted. She is not diaphoretic. No erythema. No pallor.  Psychiatric: She has a normal mood and affect. Her behavior is normal. Judgment and thought content normal.  Vitals reviewed.   Filed Vitals:   05/31/15 1438  BP: 130/72  Pulse: 94  Height: 5\' 1"  (1.549 m)  Weight: 127 lb 3.2 oz (57.698 kg)  SpO2: 97%         Assessment & Plan:     ICD-9-CM ICD-10-CM   1. Smoking history V15.82 Z72.0   2. Incidental lung nodule, > 75mm and < 18mm 793.11 R91.1   3. Other fatigue 780.79 R53.83      #smoking  -  we will work on quitting smoking -  do full PFT may 2017  #lung nodule - 69mm Feb 2017 right lower lobe and grown from prior  - do ct chest - early to mid may 2017  #fatigue - could be related to copd - do  PFT May 2017 - do CAT score 05/31/2015  #Followup  - after PFT and CT May 2017   Dr. Brand Males, M.D., F.C.C.P Pulmonary and Critical Care Medicine Staff Physician [facility] Pager: [phone], If no answer or between  15:00h - 7:00h: call [phone]  05/31/2015 3:05 PM

## 2015-05-31 NOTE — Patient Instructions (Signed)
ICD-9-CM ICD-10-CM   1. Smoking history V15.82 Z72.0   2. Incidental lung nodule, > 57mm and < 70mm 793.11 R91.1   3. Other fatigue 780.79 R53.83    #smoking  = we will work on quitting smoking - do full PFT may 2017  #lung nodule - 44mm Feb 2017 right lower lobe and grown from prior  - do ct chest - early to mid may 2017  #fatigue - could be related to copd - do  PFT May 2017 - do CAT score 05/31/2015  #Followup  - after PFT and CT May 2017

## 2015-07-13 ENCOUNTER — Ambulatory Visit (INDEPENDENT_AMBULATORY_CARE_PROVIDER_SITE_OTHER)
Admission: RE | Admit: 2015-07-13 | Discharge: 2015-07-13 | Disposition: A | Discharge status: Home / Self Care | Payer: Medicare Other | Source: Ambulatory Visit | Attending: Internal Medicine | Admitting: Internal Medicine

## 2015-07-13 DIAGNOSIS — R911 Solitary pulmonary nodule: Secondary | ICD-10-CM

## 2015-07-13 DIAGNOSIS — Z87891 Personal history of nicotine dependence: Secondary | ICD-10-CM

## 2015-07-22 ENCOUNTER — Telehealth: Payer: Self-pay | Admitting: Internal Medicine

## 2015-07-22 NOTE — Telephone Encounter (Signed)
Seeing her 07/27/15 - nodule stable. Will discuss 53/1/17 visit. You can call iher if you have time interim  IMPRESSION: 1. Stable 6 by 4 mm pleural-based nodule in the right lower lobe, image 75/3. We have now documented 3 months of stability. No follow-up needed if patient is low-risk. Non-contrast chest CT can be considered in 9 months if patient is high-risk. This recommendation follows the consensus statement: Guidelines for Management of Incidental Pulmonary Nodules Detected on CT Images:From the Fleischner Society 2017; published online before print (10.1148/radiol.IJ:2314499). 2. Paraseptal emphysema. Faint chronic ground-glass opacities in the right lower lobe. 3. Thoracic spondylosis. 4. Possible mild osseous foraminal stenosis on the left at T1-2 due to spurring. 5. Coronary, aortic arch, and branch vessel atherosclerotic vascular disease. 6. There is some mild soft tissue density surrounding the trachea, possibly from remote inflammation, chronic. 7. Left mastectomy   Electronically Signed By: Van Clines M.D. On: 07/13/2015 13:39

## 2015-07-26 ENCOUNTER — Ambulatory Visit (INDEPENDENT_AMBULATORY_CARE_PROVIDER_SITE_OTHER): Payer: Medicare Other | Admitting: Internal Medicine

## 2015-07-26 DIAGNOSIS — Z87891 Personal history of nicotine dependence: Secondary | ICD-10-CM

## 2015-07-26 DIAGNOSIS — Z720 Tobacco use: Secondary | ICD-10-CM

## 2015-07-26 DIAGNOSIS — R911 Solitary pulmonary nodule: Secondary | ICD-10-CM

## 2015-07-26 NOTE — Progress Notes (Signed)
PFT done today. 

## 2015-07-27 ENCOUNTER — Encounter: Payer: Self-pay | Admitting: Internal Medicine

## 2015-07-27 ENCOUNTER — Ambulatory Visit (INDEPENDENT_AMBULATORY_CARE_PROVIDER_SITE_OTHER): Payer: Medicare Other | Admitting: Internal Medicine

## 2015-07-27 VITALS — BP 124/62 | HR 103 | Ht 61.0 in | Wt 126.0 lb

## 2015-07-27 DIAGNOSIS — Z87891 Personal history of nicotine dependence: Secondary | ICD-10-CM

## 2015-07-27 DIAGNOSIS — R911 Solitary pulmonary nodule: Secondary | ICD-10-CM

## 2015-07-27 DIAGNOSIS — J449 Chronic obstructive pulmonary disease, unspecified: Secondary | ICD-10-CM | POA: Insufficient documentation

## 2015-07-27 DIAGNOSIS — Z720 Tobacco use: Secondary | ICD-10-CM

## 2015-07-27 MED ORDER — TIOTROPIUM BROMIDE MONOHYDRATE 1.25 MCG/ACT IN AERS: 1.0000 | INHALATION_SPRAY | Freq: Every day | RESPIRATORY_TRACT | Status: DC

## 2015-07-27 NOTE — Progress Notes (Signed)
Subjective:     Patient ID: Linda Ray, female   DOB: 1934-10-30, 81 y.o.   MRN: [id number]  HPI IOV 05/31/2015  Chief Complaint  Patient presents with  . Pulmonary Consult    Pt referred by Dr. Melford Aase for COPD. Pt states she has intermittent DOE, prod cough with clear mucus. Pt denies CP/tightness.   hx from patient and outside chart  81 year old female lives in [place]. Takes care of her 5 year old mother. She visits with Vicenta Aly of the primary care clinic there. She generally tries to avoid scheduled maintenance visits with primary care. She only sees them acutely. As best as I can gather patient has some nonspecific fatigue. Unclear if it is even worse with exertion or relieved by rest. She does not admit any shortness of breath or cough. She is a heavy smoker. However she does admit to symptoms are consistent with a COPD exacerbation/acute bronchitis. She takes antibioptics and prednisone for this at least once a year. This is being her life so for the last few years. She is able to do all her activities of daily living. Somewhere along the way she had a CT scan of the chest. I was able to visualize the CT chest from 04/01/2015 done at [facility]. The official report is that is a 6 mm right lower lobe nodule that has enlarged but there are other smaller nodules are stable. Patient has been referred hereto address his smoking, lung nodule and a fatigue. She says that she is to be taken care of her 58 year old mom. She does not have a time make frequent trips here. She likes to keep her visits here at a minimum. She is willing to have pulmonary function test and follow-up but only when clubbed with her CT chest in 3 months  Though she denies symptoms when I asked her to do cAT score it looks like she has cough, and fatigue   OV 07/27/2015  Chief Complaint  Patient presents with  . Follow-up    PFT done. Pt c/o cough with clear mucus, some wheeze and SOB with exertion. Pt  denies CP/tightness.    Here to erviewe rresults. No new issues. Wants to limit visits here due to her need to take care of her 23 year old mom  Pulmonary function test 07/26/2015 shows FVC 1.6 L/67%, FEV1 1.2 L/67% and a ratio of 74. Postbronchodilator FEV1 is 1.24 L/64%. Total lung capacity is 3.86/81%. DLCO is 14.67/68%. Although the spirometry suggests restriction with a low diffusion capacity the clinical finding on the CT scan is paraseptal emphysema   Still smokes  CAT COPD Symptom & Quality of Life Score ([place]) 0 is no burden. 5 is highest burden 05/31/2015   Never Cough -> Cough all the time 4  No phlegm in chest -> Chest is full of phlegm 2  No chest tightness -> Chest feels very tight 3  No dyspnea for 1 flight stairs/hill -> Very dyspneic for 1 flight of stairs 3  No limitations for ADL at home -> Very limited with ADL at home 1  Confident leaving home -> Not at all confident leaving home 0  Sleep soundly -> Do not sleep soundly because of lung condition 1  Lots of Energy -> No energy at all 4  TOTAL Score (max 40)  18     IMPRESSION: 1. Stable 6 by 4 mm pleural-based nodule in the right lower lobe, image 75/3. We have now documented 3 months of  stability. No follow-up needed if patient is low-risk. Non-contrast chest CT can be considered in 9 months if patient is high-risk. This recommendation follows the consensus statement: Guidelines for Management of Incidental Pulmonary Nodules Detected on CT Images:From the Fleischner Society 2017; published online before print (10.1148/radiol.IJ:2314499). 2. Paraseptal emphysema. Faint chronic ground-glass opacities in the right lower lobe. 3. Thoracic spondylosis. 4. Possible mild osseous foraminal stenosis on the left at T1-2 due to spurring. 5. Coronary, aortic arch, and branch vessel atherosclerotic vascular disease. 6. There is some mild soft tissue density surrounding the trachea, possibly from remote  inflammation, chronic. 7. Left mastectomy   Electronically Signed By: Van Clines M.D. On: 07/13/2015 13:39                   has a past medical history of Arthritis; Chronic back pain; RLS (restless legs syndrome); Breast cancer ([place]); and Borderline diabetes.   reports that she has been smoking Cigarettes.  She has a 81.25 pack-year smoking history. She has never used smokeless tobacco.  Past Surgical History  Procedure Laterality Date  . Tonsillectomy    . Abdominal surgery  1954  . Mastectomy  2007  . Back surgery  2009    Allergies  Allergen Reactions  . Codeine   . Diphth-Acell Pertussis-Tetanus Other (See Comments)    Significant local reaction  . Sulfa Drugs Cross Reactors     Immunization History  Administered Date(s) Administered  . Influenza,inj,Quad PF,36+ Mos 11/27/2014  . Pneumococcal Polysaccharide-23 02/26/1998    Family History  Problem Relation Age of Onset  . Cancer Mother     colon  . Stroke Father   . Cancer Sister     breast, bladder, kidney     Current outpatient prescriptions:  .  albuterol (PROVENTIL HFA;VENTOLIN HFA) 108 (90 Base) MCG/ACT inhaler, Inhale 1-2 puffs into the lungs every 6 (six) hours as needed for wheezing or shortness of breath., Disp: , Rfl:  .  aspirin 81 MG tablet, Take 81 mg by mouth daily.  , Disp: , Rfl:  .  B Complex-C (B-COMPLEX WITH VITAMIN C) tablet, Take 1 tablet by mouth daily., Disp: , Rfl:  .  Cholecalciferol (VITAMIN D) 2000 UNITS tablet, Take 2,000 Units by mouth daily.  , Disp: , Rfl:     Review of Systems     Objective:   Physical Exam  Constitutional: She is oriented to person, place, and time. She appears well-developed and well-nourished. No distress.  HENT:  Head: Normocephalic and atraumatic.  Right Ear: External ear normal.  Left Ear: External ear normal.  Mouth/Throat: Oropharynx is clear and moist. No oropharyngeal exudate.  Eyes: Conjunctivae and EOM are normal.  Pupils are equal, round, and reactive to light. Right eye exhibits no discharge. Left eye exhibits no discharge. No scleral icterus.  Neck: Normal range of motion. Neck supple. No JVD present. No tracheal deviation present. No thyromegaly present.  Cardiovascular: Normal rate, regular rhythm, normal heart sounds and intact distal pulses.  Exam reveals no gallop and no friction rub.   No murmur heard. Pulmonary/Chest: Effort normal and breath sounds normal. No respiratory distress. She has no wheezes. She has no rales. She exhibits no tenderness.  Abdominal: Soft. Bowel sounds are normal. She exhibits no distension and no mass. There is no tenderness. There is no rebound and no guarding.  Musculoskeletal: Normal range of motion. She exhibits no edema or tenderness.  Lymphadenopathy:    She has no cervical adenopathy.  Neurological: She  is alert and oriented to person, place, and time. She has normal reflexes. No cranial nerve deficit. She exhibits normal muscle tone. Coordination normal.  Skin: Skin is warm and dry. No rash noted. She is not diaphoretic. No erythema. No pallor.  Psychiatric: She has a normal mood and affect. Her behavior is normal. Judgment and thought content normal.  Vitals reviewed.   Filed Vitals:   07/27/15 1434  BP: 124/62  Pulse: 103  Height: 5\' 1"  (1.549 m)  Weight: 126 lb (57.153 kg)  SpO2: 94%        Assessment:       ICD-9-CM ICD-10-CM   1. Incidental lung nodule, > 85mm and < 40mm 793.11 R91.1 CT Chest Wo Contrast  2. Smoking history V15.82 Z72.0   3. COPD, moderate (HCC) 496 J44.9 Pulmonary Function Test       Plan:      #lung nodule - stable feb to may 2017  - rep[eat ct chest wo contrast in 1 year  @smoking   - please quit  #moderate copd  = start spiriva respimat daily  - use albuterol as needed - flu shot in fall   #Followup 12 months with repeat PFT Call sooner if any side effects with spiriva   > 50% of this > 25 min visit spent  in face to face counseling or coordination of care    Dr. Brand Males, M.D., [facility].C.P Pulmonary and Critical Care Medicine Staff Physician [facility] Pager: [phone], If no answer or between  15:00h - 7:00h: call [phone]  07/27/2015 3:11 PM

## 2015-07-27 NOTE — Patient Instructions (Signed)
ICD-9-CM ICD-10-CM   1. Incidental lung nodule, > 65mm and < 51mm 793.11 R91.1   2. Smoking history V15.82 Z72.0   3. COPD, moderate (HCC) 496 J44.9     #lung nodule - stable feb to may 2017  - rep[eat ct chest wo contrast in 1 year  @smoking   - please quit  #moderate copd  = start spiriva respimat daily  - use albuterol as needed - flu shot in fall   #Followup 12 months with repeat PFT Call sooner if any side effects with spiriva

## 2015-07-27 NOTE — Progress Notes (Signed)
Patient ID: Linda Ray, female   DOB: [birth year], 81 y.o.   MRN: [id number] Patient seen in the office today and instructed on use of Spiriva Respimat.  Patient expressed understanding and demonstrated technique.

## 2015-07-27 NOTE — Telephone Encounter (Signed)
This was discussed in office visit.  Nothing further needed.

## 2016-02-23 ENCOUNTER — Other Ambulatory Visit: Payer: Self-pay | Admitting: Endocrinology

## 2016-02-23 DIAGNOSIS — E049 Nontoxic goiter, unspecified: Secondary | ICD-10-CM

## 2016-03-21 ENCOUNTER — Other Ambulatory Visit: Payer: Self-pay | Admitting: Nurse Practitioner

## 2016-03-21 DIAGNOSIS — Z1231 Encounter for screening mammogram for malignant neoplasm of breast: Secondary | ICD-10-CM

## 2016-04-16 ENCOUNTER — Ambulatory Visit
Admission: RE | Admit: 2016-04-16 | Discharge: 2016-04-16 | Disposition: A | Discharge status: Home / Self Care | Payer: Medicare Other | Source: Ambulatory Visit | Attending: Nurse Practitioner | Admitting: Nurse Practitioner

## 2016-04-16 DIAGNOSIS — Z1231 Encounter for screening mammogram for malignant neoplasm of breast: Secondary | ICD-10-CM

## 2016-04-19 ENCOUNTER — Ambulatory Visit
Admission: RE | Admit: 2016-04-19 | Discharge: 2016-04-19 | Disposition: A | Discharge status: Home / Self Care | Payer: Medicare Other | Source: Ambulatory Visit | Attending: Endocrinology | Admitting: Endocrinology

## 2016-04-19 DIAGNOSIS — E049 Nontoxic goiter, unspecified: Secondary | ICD-10-CM

## 2016-07-26 ENCOUNTER — Ambulatory Visit (INDEPENDENT_AMBULATORY_CARE_PROVIDER_SITE_OTHER)
Admission: RE | Admit: 2016-07-26 | Discharge: 2016-07-26 | Disposition: A | Discharge status: Home / Self Care | Payer: Medicare Other | Source: Ambulatory Visit | Attending: Internal Medicine | Admitting: Internal Medicine

## 2016-07-26 DIAGNOSIS — R911 Solitary pulmonary nodule: Secondary | ICD-10-CM

## 2016-07-30 ENCOUNTER — Ambulatory Visit (INDEPENDENT_AMBULATORY_CARE_PROVIDER_SITE_OTHER): Payer: Medicare Other | Admitting: Internal Medicine

## 2016-07-30 ENCOUNTER — Encounter: Payer: Self-pay | Admitting: Internal Medicine

## 2016-07-30 DIAGNOSIS — J449 Chronic obstructive pulmonary disease, unspecified: Secondary | ICD-10-CM | POA: Diagnosis not present

## 2016-07-30 DIAGNOSIS — R911 Solitary pulmonary nodule: Secondary | ICD-10-CM | POA: Diagnosis not present

## 2016-07-30 DIAGNOSIS — Z87891 Personal history of nicotine dependence: Secondary | ICD-10-CM

## 2016-07-30 NOTE — Assessment & Plan Note (Signed)
Stable without flare up but higher level of symptoms due to not doing spiriva on regular basisi   Plan Reconsider taking spiriva daily with albuterol as needed Please talk to PCP Linda Ray, Linda Ray -  and ensure you get  shingarix vaccine Flu shot in fall AT followup will discuss alpha 1 check  Followup 1 year

## 2016-07-30 NOTE — Assessment & Plan Note (Signed)
Try to quit but if you are enjoying life and like smoking and do not care of consequences ok to smoke

## 2016-07-30 NOTE — Assessment & Plan Note (Signed)
Stable nodule Right lower lobe feb 2017 -> may 2018  Plan No more ct Follow clinically

## 2016-07-30 NOTE — Patient Instructions (Signed)
Incidental lung nodule, > 91mm and < 31mm Stable nodule Right lower lobe feb 2017 -> may 2018  Plan No more ct Follow clinically  Smoking history Try to quit but if you are enjoying life and like smoking and do not care of consequences ok to smoke  COPD, moderate ([place]) Stable without flare up but higher level of symptoms due to not doing spiriva on regular basisi   Plan Reconsider taking spiriva daily with albuterol as needed Please talk to PCP Linda Ray, [place] -  and ensure you get  shingarix vaccine Flu shot in fall AT followup will discuss alpha 1 check  Followup 1 year

## 2016-07-30 NOTE — Progress Notes (Signed)
Subjective:     Patient ID: Linda Ray, female   DOB: [birth year], 82 y.o.   MRN: [id number]  PCP Vicenta Aly, FNP  HPI  IOV 05/31/2015  Chief Complaint  Patient presents with  . Pulmonary Consult    Pt referred by Dr. Melford Aase for COPD. Pt states she has intermittent DOE, prod cough with clear mucus. Pt denies CP/tightness.   hx from patient and outside chart  82 year old female lives in [place]. Takes care of her 56 year old mother. She visits with Vicenta Aly of the primary care clinic there. She generally tries to avoid scheduled maintenance visits with primary care. She only sees them acutely. As best as I can gather patient has some nonspecific fatigue. Unclear if it is even worse with exertion or relieved by rest. She does not admit any shortness of breath or cough. She is a heavy smoker. However she does admit to symptoms are consistent with a COPD exacerbation/acute bronchitis. She takes antibioptics and prednisone for this at least once a year. This is being her life so for the last few years. She is able to do all her activities of daily living. Somewhere along the way she had a CT scan of the chest. I was able to visualize the CT chest from 04/01/2015 done at [facility]. The official report is that is a 6 mm right lower lobe nodule that has enlarged but there are other smaller nodules are stable. Patient has been referred hereto address his smoking, lung nodule and a fatigue. She says that she is to be taken care of her 45 year old mom. She does not have a time make frequent trips here. She likes to keep her visits here at a minimum. She is willing to have pulmonary function test and follow-up but only when clubbed with her CT chest in 3 months  Though she denies symptoms when I asked her to do cAT score it looks like she has cough, and fatigue   OV 07/27/2015  Chief Complaint  Patient presents with  . Follow-up    PFT done. Pt c/o cough with clear mucus, some  wheeze and SOB with exertion. Pt denies CP/tightness.    Here to erviewe rresults. No new issues. Wants to limit visits here due to her need to take care of her 96 year old mom  Pulmonary function test 07/26/2015 shows FVC 1.6 L/67%, FEV1 1.2 L/67% and a ratio of 74. Postbronchodilator FEV1 is 1.24 L/64%. Total lung capacity is 3.86/81%. DLCO is 14.67/68%. Although the spirometry suggests restriction with a low diffusion capacity the clinical finding on the CT scan is paraseptal emphysema   Still smokes   OV 07/30/2016  Chief Complaint  Patient presents with  . Follow-up    Pt here for 12 month f/u, pt refused PFT. Pt states her breathing has slightly worsened since last OV, pt c/o increase in prod cough with clear to brown mucus. Pt states only when she coughs her mucus is brown.   Follow-up  Smoking: She still continues to smoke. She refuses to quit. She feels she's lived her life well and there is no need for her to quit smoking because she enjoys it.  Follow right lower lobe lung nodule: The suspicion for this subpleural lymph node. She has a previous history of breast cancer. She had CT chest May 2018 and the nodule is stable compared to February 2017 and radiology feels this is benign without need for further follow-up. Patient does not want repeat CT  scan that she is against testing in general and wasting resources  Follow-up COPD: She has moderate COPD. She says that overall she's stable but she is having good days and bad days. Some days her dyspnea is worse. Last visit I gave her Spiriva a year ago but she is only using it regularly and this is definitely contributing to her not better. The suspicion of possible ILD on CT chest but she does not want repeat CT chest.  Social history: Her mother died in Dec 08, 2015 11 days short of 89 birthday    CAT COPD Symptom & Quality of Life Score ([employer]) 0 is no burden. 5 is highest burden 05/31/2015   Never Cough -> Cough all  the time 4  No phlegm in chest -> Chest is full of phlegm 2  No chest tightness -> Chest feels very tight 3  No dyspnea for 1 flight stairs/hill -> Very dyspneic for 1 flight of stairs 3  No limitations for ADL at home -> Very limited with ADL at home 1  Confident leaving home -> Not at all confident leaving home 0  Sleep soundly -> Do not sleep soundly because of lung condition 1  Lots of Energy -> No energy at all 4  TOTAL Score (max 40)  18     IMPRESSION: 1. Tiny subpleural nodule in the periphery of the right lower lobe is stable dating back to 04/01/2015, and at this time is considered definitively benign (presumably a subpleural lymph node) requiring no future imaging followup. 2. Mild diffuse bronchial wall thickening with mild centrilobular and paraseptal emphysema; imaging findings compatible with reported clinical history of COPD. 3. In addition, there is very subtle ground-glass attenuation and septal thickening in the lung bases, most evident in the right lower lobe. This has been stable compared to the prior examinations, and is favored to reflect mild nonspecific interstitial pneumonia (NSIP). 4. Aortic atherosclerosis, in addition to left main and 3 vessel coronary artery disease. Assessment for potential risk factor modification, dietary therapy or pharmacologic therapy may be warranted, if clinically indicated. 5. Additional incidental findings, as above.   Electronically Signed   By: Vinnie Langton M.D.   On: 07/26/2016 15:17    has a past medical history of Arthritis; Borderline diabetes; Breast cancer ([place]); Chronic back pain; and RLS (restless legs syndrome).   reports that she has been smoking Cigarettes.  She has a 81.25 pack-year smoking history. She has never used smokeless tobacco.  Past Surgical History:  Procedure Laterality Date  . ABDOMINAL SURGERY  1954  . BACK SURGERY  2009  . MASTECTOMY Left 2007  . TONSILLECTOMY      Allergies   Allergen Reactions  . Codeine   . Diphth-Acell Pertussis-Tetanus Other (See Comments)    Significant local reaction  . Sulfa Drugs Cross Reactors     Immunization History  Administered Date(s) Administered  . Influenza, High Dose Seasonal PF 11/27/2015  . Influenza,inj,Quad PF,36+ Mos 11/27/2014  . Pneumococcal Polysaccharide-23 02/26/1998, 11/26/2004  . Zoster 08/27/2013    Family History  Problem Relation Age of Onset  . Cancer Mother        colon  . Stroke Father   . Cancer Sister        breast, bladder, kidney     Current Outpatient Prescriptions:  .  albuterol (PROVENTIL HFA;VENTOLIN HFA) 108 (90 Base) MCG/ACT inhaler, Inhale 1-2 puffs into the lungs every 6 (six) hours as needed for wheezing or shortness of breath.,  Disp: , Rfl:  .  aspirin 81 MG tablet, Take 81 mg by mouth daily.  , Disp: , Rfl:  .  B Complex-C (B-COMPLEX WITH VITAMIN C) tablet, Take 1 tablet by mouth daily., Disp: , Rfl:  .  Cholecalciferol (VITAMIN D) 2000 UNITS tablet, Take 2,000 Units by mouth daily.  , Disp: , Rfl:  .  Tiotropium Bromide Monohydrate (SPIRIVA RESPIMAT) 1.25 MCG/ACT AERS, Inhale 1 puff into the lungs daily., Disp: 1 Inhaler, Rfl: 5   Review of Systems     Objective:   Physical Exam  Constitutional: She is oriented to person, place, and time. She appears well-developed and well-nourished. No distress.  HENT:  Head: Normocephalic and atraumatic.  Right Ear: External ear normal.  Left Ear: External ear normal.  Mouth/Throat: Oropharynx is clear and moist. No oropharyngeal exudate.  Eyes: Conjunctivae and EOM are normal. Pupils are equal, round, and reactive to light. Right eye exhibits no discharge. Left eye exhibits no discharge. No scleral icterus.  Neck: Normal range of motion. Neck supple. No JVD present. No tracheal deviation present. No thyromegaly present.  Cardiovascular: Normal rate, regular rhythm, normal heart sounds and intact distal pulses.  Exam reveals no gallop  and no friction rub.   No murmur heard. Pulmonary/Chest: Effort normal and breath sounds normal. No respiratory distress. She has no wheezes. She has no rales. She exhibits no tenderness.  Abdominal: Soft. Bowel sounds are normal. She exhibits no distension and no mass. There is no tenderness. There is no rebound and no guarding.  Musculoskeletal: Normal range of motion. She exhibits no edema or tenderness.  Lymphadenopathy:    She has no cervical adenopathy.  Neurological: She is alert and oriented to person, place, and time. She has normal reflexes. No cranial nerve deficit. She exhibits normal muscle tone. Coordination normal.  Skin: Skin is warm and dry. No rash noted. She is not diaphoretic. No erythema. No pallor.  Psychiatric: She has a normal mood and affect. Her behavior is normal. Judgment and thought content normal.  Vitals reviewed.   Vitals:   07/30/16 1027  BP: 120/80  Pulse: 88  SpO2: 96%  Weight: 133 lb 9.6 oz (60.6 kg)  Height: 5\' 1"  (1.549 m)   Body mass index is 25.24 kg/m.      Assessment:       ICD-9-CM ICD-10-CM   1. Incidental lung nodule, > 86mm and < 72mm 793.11 R91.1   2. Smoking history V15.82 Z87.891   3. COPD, moderate ([place]) 496 J44.9         Plan:     Incidental lung nodule, > 62mm and < 69mm Stable nodule Right lower lobe feb 2017 -> may 2018  Plan No more ct Follow clinically  Smoking history Try to quit but if you are enjoying life and like smoking and do not care of consequences ok to smoke  COPD, moderate (HCC) Stable without flare up but higher level of symptoms due to not doing spiriva on regular basisi   Plan Reconsider taking spiriva daily with albuterol as needed Please talk to PCP Vicenta Aly, [place] -  and ensure you get  shingarix vaccine Flu shot in fall AT followup will discuss alpha 1 check  Followup 1 year    Dr. Brand Males, M.D., [facility].C.P Pulmonary and Critical Care Medicine Staff Physician [place] [facility] Pager: [phone], If no answer or between  15:00h - 7:00h: call [phone]  [phone]  07/30/2016 10:48 AM

## 2016-08-06 ENCOUNTER — Telehealth: Payer: Self-pay | Admitting: Internal Medicine

## 2016-08-06 NOTE — Progress Notes (Signed)
LMTCB

## 2016-08-06 NOTE — Telephone Encounter (Signed)
    Stable CT sinc may 2017 though in report now they are questioning ILD. Will discuss June 2018 visit. If she missses appt we have to call her with results and make new appt    Called and spoke with pt and she stated that she was seen with MR in early June 2018---she will not come back until 2019 for an appt.  MR pt stated that you have already gone over the results of the CT with her so she is confused as to why we are calling her with the same results. Please advise. Thanks

## 2016-08-08 NOTE — Telephone Encounter (Signed)
Pt states that she does not need anything further at this time.  Apologized for the duplicate calls. Nothing further needed.  MR disregard this message. Thanks.

## 2016-08-08 NOTE — Telephone Encounter (Signed)
Pt returned phone call..ert ° ° °

## 2016-08-08 NOTE — Progress Notes (Signed)
Results were delivered to patient at 07/29/2016 appointment. Will close note.

## 2016-09-17 ENCOUNTER — Ambulatory Visit: Payer: Medicare Other | Admitting: Emergency Medicine

## 2017-03-11 ENCOUNTER — Other Ambulatory Visit: Payer: Self-pay | Admitting: Nurse Practitioner

## 2017-03-11 DIAGNOSIS — Z1231 Encounter for screening mammogram for malignant neoplasm of breast: Secondary | ICD-10-CM

## 2017-04-17 ENCOUNTER — Ambulatory Visit: Payer: Medicare Other

## 2017-05-02 ENCOUNTER — Other Ambulatory Visit: Payer: Self-pay

## 2017-05-15 ENCOUNTER — Ambulatory Visit
Admission: RE | Admit: 2017-05-15 | Discharge: 2017-05-15 | Disposition: A | Discharge status: Home / Self Care | Payer: Medicare Other | Source: Ambulatory Visit | Attending: Nurse Practitioner | Admitting: Nurse Practitioner

## 2017-05-15 DIAGNOSIS — Z1231 Encounter for screening mammogram for malignant neoplasm of breast: Secondary | ICD-10-CM

## 2018-04-10 ENCOUNTER — Other Ambulatory Visit: Payer: Self-pay | Admitting: Nurse Practitioner

## 2018-04-10 DIAGNOSIS — Z1231 Encounter for screening mammogram for malignant neoplasm of breast: Secondary | ICD-10-CM

## 2018-05-07 ENCOUNTER — Other Ambulatory Visit: Payer: Self-pay | Admitting: Endocrinology

## 2018-05-07 DIAGNOSIS — E049 Nontoxic goiter, unspecified: Secondary | ICD-10-CM

## 2018-05-12 ENCOUNTER — Other Ambulatory Visit: Payer: Medicare Other

## 2018-05-19 ENCOUNTER — Ambulatory Visit: Payer: Medicare Other

## 2018-06-10 ENCOUNTER — Encounter (HOSPITAL_BASED_OUTPATIENT_CLINIC_OR_DEPARTMENT_OTHER): Payer: Self-pay | Admitting: *Deleted

## 2018-06-10 ENCOUNTER — Other Ambulatory Visit: Payer: Self-pay

## 2018-06-10 ENCOUNTER — Inpatient Hospital Stay (HOSPITAL_BASED_OUTPATIENT_CLINIC_OR_DEPARTMENT_OTHER)
Admission: EM | Admit: 2018-06-10 | Discharge: 2018-06-12 | DRG: 640 | Disposition: A | Discharge status: Home / Self Care | Payer: Medicare Other | Attending: Internal Medicine | Admitting: Internal Medicine

## 2018-06-10 ENCOUNTER — Telehealth (HOSPITAL_BASED_OUTPATIENT_CLINIC_OR_DEPARTMENT_OTHER): Payer: Self-pay | Admitting: Emergency Medicine

## 2018-06-10 ENCOUNTER — Emergency Department (HOSPITAL_BASED_OUTPATIENT_CLINIC_OR_DEPARTMENT_OTHER): Payer: Medicare Other

## 2018-06-10 DIAGNOSIS — G8929 Other chronic pain: Secondary | ICD-10-CM | POA: Diagnosis not present

## 2018-06-10 DIAGNOSIS — M549 Dorsalgia, unspecified: Secondary | ICD-10-CM | POA: Diagnosis present

## 2018-06-10 DIAGNOSIS — Z7982 Long term (current) use of aspirin: Secondary | ICD-10-CM | POA: Diagnosis not present

## 2018-06-10 DIAGNOSIS — M1990 Unspecified osteoarthritis, unspecified site: Secondary | ICD-10-CM | POA: Diagnosis present

## 2018-06-10 DIAGNOSIS — Z79899 Other long term (current) drug therapy: Secondary | ICD-10-CM

## 2018-06-10 DIAGNOSIS — Z853 Personal history of malignant neoplasm of breast: Secondary | ICD-10-CM | POA: Diagnosis not present

## 2018-06-10 DIAGNOSIS — D638 Anemia in other chronic diseases classified elsewhere: Secondary | ICD-10-CM | POA: Diagnosis present

## 2018-06-10 DIAGNOSIS — N3000 Acute cystitis without hematuria: Secondary | ICD-10-CM | POA: Diagnosis present

## 2018-06-10 DIAGNOSIS — Z8051 Family history of malignant neoplasm of kidney: Secondary | ICD-10-CM

## 2018-06-10 DIAGNOSIS — M26603 Bilateral temporomandibular joint disorder, unspecified: Secondary | ICD-10-CM | POA: Diagnosis present

## 2018-06-10 DIAGNOSIS — R3915 Urgency of urination: Secondary | ICD-10-CM | POA: Diagnosis not present

## 2018-06-10 DIAGNOSIS — J440 Chronic obstructive pulmonary disease with acute lower respiratory infection: Secondary | ICD-10-CM | POA: Diagnosis present

## 2018-06-10 DIAGNOSIS — R7989 Other specified abnormal findings of blood chemistry: Secondary | ICD-10-CM | POA: Diagnosis not present

## 2018-06-10 DIAGNOSIS — G2581 Restless legs syndrome: Secondary | ICD-10-CM | POA: Diagnosis present

## 2018-06-10 DIAGNOSIS — E871 Hypo-osmolality and hyponatremia: Secondary | ICD-10-CM | POA: Diagnosis not present

## 2018-06-10 DIAGNOSIS — Z885 Allergy status to narcotic agent status: Secondary | ICD-10-CM | POA: Diagnosis not present

## 2018-06-10 DIAGNOSIS — Z887 Allergy status to serum and vaccine status: Secondary | ICD-10-CM | POA: Diagnosis not present

## 2018-06-10 DIAGNOSIS — Z803 Family history of malignant neoplasm of breast: Secondary | ICD-10-CM

## 2018-06-10 DIAGNOSIS — R21 Rash and other nonspecific skin eruption: Secondary | ICD-10-CM | POA: Diagnosis not present

## 2018-06-10 DIAGNOSIS — F17210 Nicotine dependence, cigarettes, uncomplicated: Secondary | ICD-10-CM | POA: Diagnosis present

## 2018-06-10 DIAGNOSIS — M791 Myalgia, unspecified site: Secondary | ICD-10-CM | POA: Diagnosis present

## 2018-06-10 DIAGNOSIS — Z9012 Acquired absence of left breast and nipple: Secondary | ICD-10-CM | POA: Diagnosis not present

## 2018-06-10 DIAGNOSIS — Z882 Allergy status to sulfonamides status: Secondary | ICD-10-CM | POA: Diagnosis not present

## 2018-06-10 DIAGNOSIS — Z66 Do not resuscitate: Secondary | ICD-10-CM | POA: Diagnosis present

## 2018-06-10 DIAGNOSIS — E119 Type 2 diabetes mellitus without complications: Secondary | ICD-10-CM | POA: Diagnosis not present

## 2018-06-10 DIAGNOSIS — N390 Urinary tract infection, site not specified: Secondary | ICD-10-CM | POA: Diagnosis present

## 2018-06-10 DIAGNOSIS — J189 Pneumonia, unspecified organism: Secondary | ICD-10-CM | POA: Diagnosis present

## 2018-06-10 DIAGNOSIS — Z8052 Family history of malignant neoplasm of bladder: Secondary | ICD-10-CM

## 2018-06-10 DIAGNOSIS — R000 Tachycardia, unspecified: Secondary | ICD-10-CM | POA: Diagnosis present

## 2018-06-10 DIAGNOSIS — R0989 Other specified symptoms and signs involving the circulatory and respiratory systems: Secondary | ICD-10-CM

## 2018-06-10 DIAGNOSIS — Z800 Family history of malignant neoplasm of digestive organs: Secondary | ICD-10-CM

## 2018-06-10 LAB — CBC WITH DIFFERENTIAL/PLATELET
Abs Immature Granulocytes: 0.13 10*3/uL — ABNORMAL HIGH (ref 0.00–0.07)
Basophils Absolute: 0.1 10*3/uL (ref 0.0–0.1)
Basophils Relative: 0 %
Eosinophils Absolute: 0.2 10*3/uL (ref 0.0–0.5)
Eosinophils Relative: 1 %
HCT: 34.2 % — ABNORMAL LOW (ref 36.0–46.0)
Hemoglobin: 11.2 g/dL — ABNORMAL LOW (ref 12.0–15.0)
Immature Granulocytes: 1 %
Lymphocytes Relative: 9 %
Lymphs Abs: 1.6 10*3/uL (ref 0.7–4.0)
MCH: 30.3 pg (ref 26.0–34.0)
MCHC: 32.7 g/dL (ref 30.0–36.0)
MCV: 92.4 fL (ref 80.0–100.0)
Monocytes Absolute: 1.6 10*3/uL — ABNORMAL HIGH (ref 0.1–1.0)
Monocytes Relative: 9 %
Neutro Abs: 14.1 10*3/uL — ABNORMAL HIGH (ref 1.7–7.7)
Neutrophils Relative %: 80 %
Platelets: 584 10*3/uL — ABNORMAL HIGH (ref 150–400)
RBC: 3.7 MIL/uL — ABNORMAL LOW (ref 3.87–5.11)
RDW: 13.2 % (ref 11.5–15.5)
WBC: 17.5 10*3/uL — ABNORMAL HIGH (ref 4.0–10.5)
nRBC: 0 % (ref 0.0–0.2)

## 2018-06-10 LAB — COMPREHENSIVE METABOLIC PANEL
ALT: 33 U/L (ref 0–44)
AST: 33 U/L (ref 15–41)
Albumin: 2.7 g/dL — ABNORMAL LOW (ref 3.5–5.0)
Alkaline Phosphatase: 72 U/L (ref 38–126)
Anion gap: 10 (ref 5–15)
BUN: 15 mg/dL (ref 8–23)
CO2: 25 mmol/L (ref 22–32)
Calcium: 8.5 mg/dL — ABNORMAL LOW (ref 8.9–10.3)
Chloride: 91 mmol/L — ABNORMAL LOW (ref 98–111)
Creatinine, Ser: 0.76 mg/dL (ref 0.44–1.00)
GFR calc Af Amer: >60 mL/min (ref >60)
GFR calc non Af Amer: >60 mL/min (ref >60)
Glucose, Bld: 159 mg/dL — ABNORMAL HIGH (ref 70–99)
Potassium: 4.6 mmol/L (ref 3.5–5.1)
Sodium: 126 mmol/L — ABNORMAL LOW (ref 135–145)
Total Bilirubin: 0.3 mg/dL (ref 0.3–1.2)
Total Protein: 6.6 g/dL (ref 6.5–8.1)

## 2018-06-10 LAB — BASIC METABOLIC PANEL
Anion gap: 10 (ref 5–15)
Anion gap: 9 (ref 5–15)
BUN: 14 mg/dL (ref 8–23)
BUN: 14 mg/dL (ref 8–23)
CO2: 24 mmol/L (ref 22–32)
CO2: 24 mmol/L (ref 22–32)
Calcium: 8.3 mg/dL — ABNORMAL LOW (ref 8.9–10.3)
Calcium: 8.1 mg/dL — ABNORMAL LOW (ref 8.9–10.3)
Chloride: 93 mmol/L — ABNORMAL LOW (ref 98–111)
Chloride: 94 mmol/L — ABNORMAL LOW (ref 98–111)
Creatinine, Ser: 0.68 mg/dL (ref 0.44–1.00)
Creatinine, Ser: 0.74 mg/dL (ref 0.44–1.00)
GFR calc Af Amer: >60 mL/min (ref >60)
GFR calc Af Amer: >60 mL/min (ref >60)
GFR calc non Af Amer: >60 mL/min (ref >60)
GFR calc non Af Amer: >60 mL/min (ref >60)
Glucose, Bld: 170 mg/dL — ABNORMAL HIGH (ref 70–99)
Glucose, Bld: 159 mg/dL — ABNORMAL HIGH (ref 70–99)
Potassium: 4.8 mmol/L (ref 3.5–5.1)
Potassium: 4.5 mmol/L (ref 3.5–5.1)
Sodium: 127 mmol/L — ABNORMAL LOW (ref 135–145)
Sodium: 127 mmol/L — ABNORMAL LOW (ref 135–145)

## 2018-06-10 LAB — URINALYSIS, ROUTINE W REFLEX MICROSCOPIC
Bilirubin Urine: NEGATIVE
Glucose, UA: NEGATIVE mg/dL
Hgb urine dipstick: NEGATIVE
Ketones, ur: NEGATIVE mg/dL
Nitrite: NEGATIVE
Protein, ur: NEGATIVE mg/dL
Specific Gravity, Urine: 1.01 (ref 1.005–1.030)
pH: 6 (ref 5.0–8.0)

## 2018-06-10 LAB — CK: Total CK: 52 U/L (ref 38–234)

## 2018-06-10 LAB — URINALYSIS, MICROSCOPIC (REFLEX): RBC / HPF: NONE SEEN RBC/hpf (ref 0–5)

## 2018-06-10 LAB — LACTIC ACID, PLASMA
Lactic Acid, Venous: 1.5 mmol/L (ref 0.5–1.9)
Lactic Acid, Venous: 1.4 mmol/L (ref 0.5–1.9)

## 2018-06-10 LAB — GLUCOSE, CAPILLARY: Glucose-Capillary: 179 mg/dL — ABNORMAL HIGH (ref 70–99)

## 2018-06-10 LAB — SODIUM, URINE, RANDOM: Sodium, Ur: 55 mmol/L

## 2018-06-10 MED ORDER — SODIUM CHLORIDE 0.9 % IV SOLN
1.0000 g | INTRAVENOUS | Status: DC
  Administered 2018-06-11: 1 g via INTRAVENOUS
  Filled 2018-06-10: qty 1
  Filled 2018-06-10: qty 10

## 2018-06-10 MED ORDER — THIAMINE HCL 100 MG/ML IJ SOLN
100.0000 mg | Freq: Every day | INTRAMUSCULAR | Status: DC
  Administered 2018-06-10: 100 mg via INTRAVENOUS
  Filled 2018-06-10: qty 2

## 2018-06-10 MED ORDER — NICOTINE 21 MG/24HR TD PT24
21.0000 mg | MEDICATED_PATCH | Freq: Every day | TRANSDERMAL | Status: DC
  Administered 2018-06-10 – 2018-06-11 (×2): 21 mg via TRANSDERMAL
  Filled 2018-06-10 (×2): qty 1

## 2018-06-10 MED ORDER — TIOTROPIUM BROMIDE MONOHYDRATE 1.25 MCG/ACT IN AERS: 1.0000 | INHALATION_SPRAY | Freq: Every day | RESPIRATORY_TRACT | Status: DC

## 2018-06-10 MED ORDER — LORAZEPAM 2 MG/ML IJ SOLN: 1.0000 mg | Freq: Four times a day (QID) | INTRAMUSCULAR | Status: DC | PRN

## 2018-06-10 MED ORDER — SODIUM CHLORIDE 0.9 % IV SOLN: INTRAVENOUS | Status: DC | PRN

## 2018-06-10 MED ORDER — ENOXAPARIN SODIUM 40 MG/0.4ML ~~LOC~~ SOLN
40.0000 mg | SUBCUTANEOUS | Status: DC
  Administered 2018-06-10 – 2018-06-11 (×2): 40 mg via SUBCUTANEOUS
  Filled 2018-06-10 (×2): qty 0.4

## 2018-06-10 MED ORDER — ALBUTEROL SULFATE (2.5 MG/3ML) 0.083% IN NEBU
2.5000 mg | INHALATION_SOLUTION | Freq: Four times a day (QID) | RESPIRATORY_TRACT | Status: DC | PRN
  Administered 2018-06-12: 2.5 mg via RESPIRATORY_TRACT
  Filled 2018-06-10 (×3): qty 3

## 2018-06-10 MED ORDER — ASPIRIN 81 MG PO CHEW
81.0000 mg | CHEWABLE_TABLET | Freq: Every day | ORAL | Status: DC
  Administered 2018-06-11 – 2018-06-12 (×2): 81 mg via ORAL
  Filled 2018-06-10 (×2): qty 1

## 2018-06-10 MED ORDER — UMECLIDINIUM BROMIDE 62.5 MCG/INH IN AEPB
1.0000 | INHALATION_SPRAY | Freq: Every day | RESPIRATORY_TRACT | Status: DC
  Filled 2018-06-10: qty 7

## 2018-06-10 MED ORDER — VITAMIN B-1 100 MG PO TABS
100.0000 mg | ORAL_TABLET | Freq: Every day | ORAL | Status: DC
  Administered 2018-06-11 – 2018-06-12 (×2): 100 mg via ORAL
  Filled 2018-06-10 (×3): qty 1

## 2018-06-10 MED ORDER — B COMPLEX-C PO TABS: 1.0000 | ORAL_TABLET | Freq: Every day | ORAL | Status: DC

## 2018-06-10 MED ORDER — ADULT MULTIVITAMIN W/MINERALS CH
1.0000 | ORAL_TABLET | Freq: Every day | ORAL | Status: DC
  Administered 2018-06-10 – 2018-06-12 (×3): 1 via ORAL
  Filled 2018-06-10 (×3): qty 1

## 2018-06-10 MED ORDER — ACETAMINOPHEN 650 MG RE SUPP: 650.0000 mg | Freq: Four times a day (QID) | RECTAL | Status: DC | PRN

## 2018-06-10 MED ORDER — ACETAMINOPHEN 325 MG PO TABS: 650.0000 mg | ORAL_TABLET | Freq: Four times a day (QID) | ORAL | Status: DC | PRN

## 2018-06-10 MED ORDER — SODIUM CHLORIDE 0.9 % IV SOLN
1.0000 g | Freq: Once | INTRAVENOUS | Status: AC
  Administered 2018-06-10: 1 g via INTRAVENOUS
  Filled 2018-06-10: qty 10

## 2018-06-10 MED ORDER — LORAZEPAM 1 MG PO TABS: 1.0000 mg | ORAL_TABLET | Freq: Four times a day (QID) | ORAL | Status: DC | PRN

## 2018-06-10 MED ORDER — SODIUM CHLORIDE 0.9 % IV SOLN
INTRAVENOUS | Status: AC
  Administered 2018-06-10: 21:00:00 via INTRAVENOUS

## 2018-06-10 MED ORDER — FOLIC ACID 1 MG PO TABS
1.0000 mg | ORAL_TABLET | Freq: Every day | ORAL | Status: DC
  Administered 2018-06-10 – 2018-06-12 (×3): 1 mg via ORAL
  Filled 2018-06-10 (×3): qty 1

## 2018-06-10 MED ORDER — INSULIN ASPART 100 UNIT/ML ~~LOC~~ SOLN: 0.0000 [IU] | Freq: Three times a day (TID) | SUBCUTANEOUS | Status: DC

## 2018-06-10 MED ORDER — LACTATED RINGERS IV BOLUS
1000.0000 mL | Freq: Once | INTRAVENOUS | Status: AC
  Administered 2018-06-10: 1000 mL via INTRAVENOUS

## 2018-06-10 NOTE — Telephone Encounter (Signed)
Encounter opened in error

## 2018-06-10 NOTE — ED Provider Notes (Signed)
[facility] EMERGENCY DEPARTMENT Provider Note   CSN: [id number] Arrival date & time: 06/10/18  1312    History   Chief Complaint Chief Complaint  Patient presents with  . Multiple  complaints    HPI Linda Ray is a 84 y.o. female.     HPI  84 year old female presents with a chief complaint of rash but also complaining of 1 month of leg pain.  She states the rash was first noticed on her legs 3 or 4 days ago.  It is not itchy or painful.  This morning she noticed it in her arms.  Has not seemed to notice it on her trunk or face.  Again there is no discomfort associated with these so she is not entirely sure how long they have been there.  She also endorses "TMJ" for at least a month and was recently put on some sort of anti-inflammatory and muscle relaxer 1 week ago.  The TMJ is bilateral and occasionally gives her a headache but no specific headache now.  In fact the jaws not hurting at all right now.  She also has had bilateral leg pain in her calves and in her right thigh for about a month.  It is worse when she gets up to walk or stand.  Right now she feels fine.  This is slowly getting worse.  No fever, headache currently, chest pain, shortness of breath, cough, vomiting or diarrhea.  Past Medical History:  Diagnosis Date  . Arthritis   . Borderline diabetes   . Breast cancer ([place])   . Chronic back pain   . RLS (restless legs syndrome)     Patient Active Problem List   Diagnosis Date Noted  . COPD, moderate ([place]) 07/27/2015  . Smoking history 05/31/2015  . Incidental lung nodule, > 57mm and < 48mm 05/31/2015  . Other fatigue 05/31/2015  . Breast cancer, left breast - lobular T2N0 12/05/2010    Past Surgical History:  Procedure Laterality Date  . ABDOMINAL SURGERY  1954  . BACK SURGERY  2009  . BREAST SURGERY    . MASTECTOMY Left 2007  . MASTECTOMY    . TONSILLECTOMY       OB History   No obstetric history on file.      Home Medications     Prior to Admission medications   Medication Sig Start Date End Date Taking? Authorizing Provider  albuterol (PROVENTIL HFA;VENTOLIN HFA) 108 (90 Base) MCG/ACT inhaler Inhale 1-2 puffs into the lungs every 6 (six) hours as needed for wheezing or shortness of breath.    [provider]  aspirin 81 MG tablet Take 81 mg by mouth daily.      [provider]  B Complex-C (B-COMPLEX WITH VITAMIN C) tablet Take 1 tablet by mouth daily.    [provider]  Cholecalciferol (VITAMIN D) 2000 UNITS tablet Take 2,000 Units by mouth daily.      [provider]  Tiotropium Bromide Monohydrate (SPIRIVA RESPIMAT) 1.25 MCG/ACT AERS Inhale 1 puff into the lungs daily. 07/27/15   Brand Males, MD    Family History Family History  Problem Relation Age of Onset  . Cancer Mother        colon  . Stroke Father   . Cancer Sister        breast, bladder, kidney  . Breast cancer Sister     Social History Social History   Tobacco Use  . Smoking status: Current Every Day Smoker  Packs/day: 1.25    Years: 65.00    Pack years: 81.25    Types: Cigarettes  . Smokeless tobacco: Never Used  . Tobacco comment: currently smoking 1ppd 07/30/2016  Substance Use Topics  . Alcohol use: Yes    Alcohol/week: 2.0 standard drinks    Types: 2 Standard drinks or equivalent per week    Comment: 2 coctails per day  . Drug use: No     Allergies   Codeine; Sulfa drugs cross reactors; and Tetanus-diphth-acell pertussis   Review of Systems Review of Systems  Constitutional: Negative for fever.  HENT:       Jaw pain  Respiratory: Negative for shortness of breath.   Cardiovascular: Negative for chest pain.  Gastrointestinal: Negative for abdominal pain, diarrhea and vomiting.  Musculoskeletal: Positive for myalgias.  Skin: Positive for rash.  Neurological: Negative for weakness and numbness.  All other systems reviewed and are negative.    Physical Exam Updated Vital Signs  BP (!) 138/57   Pulse 100   Temp 98.3 F (36.8 C) (Oral)   Resp (!) 28   Ht 5\' 1"  (1.549 m)   Wt 62.1 kg   SpO2 100%   BMI 25.89 kg/m   Physical Exam Vitals signs and nursing note reviewed.  Constitutional:      Appearance: She is well-developed.  HENT:     Head: Normocephalic and atraumatic.     Right Ear: External ear normal.     Left Ear: External ear normal.     Nose: Nose normal.  Eyes:     General:        Right eye: No discharge.        Left eye: No discharge.  Cardiovascular:     Rate and Rhythm: Regular rhythm. Tachycardia present.     Heart sounds: Normal heart sounds.     Comments: HR low 100s on exam Pulmonary:     Effort: Pulmonary effort is normal.     Breath sounds: Normal breath sounds.  Abdominal:     General: There is no distension.     Palpations: Abdomen is soft.     Tenderness: There is no abdominal tenderness.  Skin:    General: Skin is warm and dry.     Findings: Rash present.     Comments: See picture. There is a faint pink rash to proximal thighs and volar forearms/proximal arms. No tenderness. They are flat.   Neurological:     Mental Status: She is alert.  Psychiatric:        Mood and Affect: Mood is not anxious.          ED Treatments / Results  Labs (all labs ordered are listed, but only abnormal results are displayed) Labs Reviewed  COMPREHENSIVE METABOLIC PANEL - Abnormal; Notable for the following components:      Result Value   Sodium 126 (*)    Chloride 91 (*)    Glucose, Bld 159 (*)    Calcium 8.5 (*)    Albumin 2.7 (*)    All other components within normal limits  CBC WITH DIFFERENTIAL/PLATELET - Abnormal; Notable for the following components:   WBC 17.5 (*)    RBC 3.70 (*)    Hemoglobin 11.2 (*)    HCT 34.2 (*)    Platelets 584 (*)    Neutro Abs 14.1 (*)    Monocytes Absolute 1.6 (*)    Abs Immature Granulocytes 0.13 (*)    All other components within normal limits  URINALYSIS, ROUTINE W REFLEX MICROSCOPIC -  Abnormal; Notable for the following components:   Leukocytes,Ua SMALL (*)    All other components within normal limits  URINALYSIS, MICROSCOPIC (REFLEX) - Abnormal; Notable for the following components:   Bacteria, UA RARE (*)    All other components within normal limits  CK  SODIUM, URINE, RANDOM  LACTIC ACID, PLASMA  LACTIC ACID, PLASMA    EKG EKG Interpretation  Date/Time:  Tuesday June 10 2018 13:50:29 EDT Ventricular Rate:  103 PR Interval:    QRS Duration: 84 QT Interval:  330 QTC Calculation: 432 R Axis:   73 Text Interpretation:  Sinus tachycardia Anterior infarct, old Baseline wander in lead(s) V6 rate is faster, otherwise similar to 2009 Confirmed by Sherwood Gambler [phone]) on 06/10/2018 2:09:47 PM   Radiology Dg Chest 2 View  Result Date: 06/10/2018 CLINICAL DATA:  Bilateral leg pain.  Cough. EXAM: CHEST - 2 VIEW COMPARISON:  January 26, 2010 FINDINGS: The cardiomediastinal silhouette is stable with mild stable cardiomegaly. The hila and mediastinum are normal. No nodules or masses. No focal infiltrates. No acute abnormalities. IMPRESSION: No active cardiopulmonary disease. Electronically Signed   By: Dorise Bullion III M.D   On: 06/10/2018 15:19    Procedures Procedures (including critical care time)  Medications Ordered in ED Medications  lactated ringers bolus 1,000 mL (0 mLs Intravenous Stopped 06/10/18 1523)     Initial Impression / Assessment and Plan / ED Course  I have reviewed the triage vital signs and the nursing notes.  Pertinent labs & imaging results that were available during my care of the patient were reviewed by me and considered in my medical decision making (see chart for details).        Unclear source of her rash.  However it does not appear emergent based on no fever, pain, etc.  Perhaps did signals underlying vasculitis given the acute inflammation noted with elevated WBC.  She is also noted to have moderate hyponatremia.  She has  some longstanding generalized weakness.  No focal weakness, altered mental status or seizures.  However with her age and this finding with no clear source, I think she should be admitted.  She is also noted to be tachycardic and was given a 1 L IV fluid bolus.  However despite this she remains tachycardic in the low 100s.  Urinalysis with 6-10 WBCs.  No urinary tract infection symptoms. However, given all the abnormalities with elevated WBC, will give Rocephin for possible UTI. Lactate added on given the persistent tachycardia. Discussed with Dr. Karleen Hampshire for admission.  Final Clinical Impressions(s) / ED Diagnoses   Final diagnoses:  Hyponatremia    ED Discharge Orders    None       Sherwood Gambler, MD 06/10/18 1605

## 2018-06-10 NOTE — ED Notes (Signed)
Report given to floor, pt transferred via [facility] to [id number]

## 2018-06-10 NOTE — ED Notes (Signed)
Per Lab, Urine sample provided insufficient for a urine sodium level. Will recollect a new sample  when pt is able to void again

## 2018-06-10 NOTE — H&P (Addendum)
History and Physical    Linda Ray VPX:[id number] DOB: 19-Dec-1934 DOA: 06/10/2018  PCP: Vicenta Aly, [place] Patient coming from: [place] [facility]  Chief Complaint: Bilateral leg soreness and rash  HPI: Linda Ray is a 84 y.o. female with medical history significant of arthritis, type 2 diabetes, breast cancer, chronic back pain, RLS presenting as a transfer from [place] ED for evaluation of bilateral leg soreness and rash.  Patient states the muscles in both of her legs (upper and lower) have been sore for the past 1 month which is making it difficult for her to get up and walk.  Denies any focal weakness.  States she has TMJ for which she was recently treated with an anti-inflammatory medication and a muscle relaxer by her dentist.  She finished her medication last week and a day later noticed a new rash on both of her legs.  This morning she noticed that the rash has now spread to both of her arms.  She has not noticed any rash on her chest, abdomen, or face.  The rash is nonpruritic.  Denies any fevers, chills, or new joint pains.  States she has chronic intermittent cough and shortness of breath secondary to COPD; no recent change and at baseline per patient.  Denies any chest pain, nausea, vomiting, or diarrhea.  Denies any dysuria.  Reports chronic urinary urgency.  Denies any history of blood clots.  Review of Systems: As per HPI otherwise 10 point review of systems negative.  Past Medical History:  Diagnosis Date  . Arthritis   . Borderline diabetes   . Breast cancer ([place])   . Chronic back pain   . RLS (restless legs syndrome)     Past Surgical History:  Procedure Laterality Date  . ABDOMINAL SURGERY  1954  . BACK SURGERY  2009  . BREAST SURGERY    . MASTECTOMY Left 2007  . MASTECTOMY    . TONSILLECTOMY       reports that she has been smoking cigarettes. She has a 81.25 pack-year smoking history. She has never used smokeless tobacco. She reports  current alcohol use of about 2.0 standard drinks of alcohol per week. She reports that she does not use drugs.  Allergies  Allergen Reactions  . Codeine   . Sulfa Drugs Cross Reactors   . Tetanus-Diphth-Acell Pertussis Other (See Comments)    Significant local reaction    Family History  Problem Relation Age of Onset  . Cancer Mother        colon  . Stroke Father   . Cancer Sister        breast, bladder, kidney  . Breast cancer Sister     Prior to Admission medications   Medication Sig Start Date End Date Taking? Authorizing Provider  aspirin 81 MG tablet Take 81 mg by mouth daily.     Yes [provider]  B Complex-C (B-COMPLEX WITH VITAMIN C) tablet Take 1 tablet by mouth daily.   Yes [provider]  albuterol (PROVENTIL HFA;VENTOLIN HFA) 108 (90 Base) MCG/ACT inhaler Inhale 1-2 puffs into the lungs every 6 (six) hours as needed for wheezing or shortness of breath.    [provider]  Tiotropium Bromide Monohydrate (SPIRIVA RESPIMAT) 1.25 MCG/ACT AERS Inhale 1 puff into the lungs daily. Patient not taking: Reported on 06/10/2018 07/27/15   Brand Males, MD    Physical Exam: Vitals:   06/10/18 1515 06/10/18 1600 06/10/18 1700 06/10/18 1811  BP: (!) 138/57 (!) 151/81 132/60 (!) 146/67  Pulse:  (!) 105 (!) 109 (!) 109  Resp: (!) 28 (!) _0 Temp:  98.4 F (36.9 C)  98.3 F (36.8 C)  TempSrc:  Oral  Oral  SpO2:  99% 98% 97%  Weight:      Height:        Physical Exam  Constitutional: She is oriented to person, place, and time. She appears well-developed and well-nourished. No distress.  HENT:  Head: Normocephalic.  Eyes: Right eye exhibits no discharge. Left eye exhibits no discharge.  Neck: Neck supple.  Cardiovascular: Regular rhythm and intact distal pulses.  Slightly tachycardic  Pulmonary/Chest: Effort normal and breath sounds normal. No respiratory distress. She has no wheezes. She has no rales.  Abdominal: Soft. Bowel sounds  are normal. She exhibits no distension. There is no abdominal tenderness. There is no guarding.  Musculoskeletal:        General: No edema.     Comments: Bilateral lower extremities symmetric in size.  No erythema, increased warmth, or edema. Muscles in bilateral lower extremities are nontender to palpation.  Neurological: She is alert and oriented to person, place, and time.  Skin: Skin is warm and dry. She is not diaphoretic.  Rash noted on bilateral lower and upper extremities.  Appears somewhat reticulated.  No rash noted on trunk or face.         Labs on Admission: I have personally reviewed following labs and imaging studies  CBC: Recent Labs  Lab 06/10/18 1348  WBC 17.5*  NEUTROABS 14.1*  HGB 11.2*  HCT 34.2*  MCV 92.4  PLT 297*   Basic Metabolic Panel: Recent Labs  Lab 06/10/18 1348 06/10/18 1922  NA 126* 127*  K 4.6 4.8  CL 91* 93*  CO2 25 24  GLUCOSE 159* 170*  BUN 15 14  CREATININE 0.76 0.68  CALCIUM 8.5* 8.3*   GFR: Estimated Creatinine Clearance: 45.8 mL/min (by C-G formula based on SCr of 0.68 mg/dL). Liver Function Tests: Recent Labs  Lab 06/10/18 1348  AST 33  ALT 33  ALKPHOS 72  BILITOT 0.3  PROT 6.6  ALBUMIN 2.7*   No results for input(s): LIPASE, AMYLASE in the last 168 hours. No results for input(s): AMMONIA in the last 168 hours. Coagulation Profile: No results for input(s): INR, PROTIME in the last 168 hours. Cardiac Enzymes: Recent Labs  Lab 06/10/18 1348  CKTOTAL 52   BNP (last 3 results) No results for input(s): PROBNP in the last 8760 hours. HbA1C: No results for input(s): HGBA1C in the last 72 hours. CBG: No results for input(s): GLUCAP in the last 168 hours. Lipid Profile: No results for input(s): CHOL, HDL, LDLCALC, TRIG, CHOLHDL, LDLDIRECT in the last 72 hours. Thyroid Function Tests: No results for input(s): TSH, T4TOTAL, FREET4, T3FREE, THYROIDAB in the last 72 hours. Anemia Panel: No results for input(s):  VITAMINB12, FOLATE, FERRITIN, TIBC, IRON, RETICCTPCT in the last 72 hours. Urine analysis:    Component Value Date/Time   COLORURINE YELLOW 06/10/2018 1450   APPEARANCEUR CLEAR 06/10/2018 1450   LABSPEC 1.010 06/10/2018 1450   PHURINE 6.0 06/10/2018 1450   GLUCOSEU NEGATIVE 06/10/2018 1450   HGBUR NEGATIVE 06/10/2018 1450   BILIRUBINUR NEGATIVE 06/10/2018 1450   KETONESUR NEGATIVE 06/10/2018 1450   PROTEINUR NEGATIVE 06/10/2018 1450   NITRITE NEGATIVE 06/10/2018 1450   LEUKOCYTESUR SMALL (A) 06/10/2018 1450    Radiological Exams on Admission: Dg Chest 2 View  Result Date: 06/10/2018  CLINICAL DATA:  Bilateral leg pain.  Cough. EXAM: CHEST - 2 VIEW COMPARISON:  January 26, 2010 FINDINGS: The cardiomediastinal silhouette is stable with mild stable cardiomegaly. The hila and mediastinum are normal. No nodules or masses. No focal infiltrates. No acute abnormalities. IMPRESSION: No active cardiopulmonary disease. Electronically Signed   By: Dorise Bullion III M.D   On: 06/10/2018 15:19    EKG: Independently reviewed.  Sinus tachycardia (heart rate 103).  Assessment/Plan Principal Problem:   Rash Active Problems:   Hyponatremia   Myalgia   Sinus tachycardia   UTI (urinary tract infection)   Rash Noted on bilateral lower and upper extremities.  Appears somewhat reticulated.  Nonpruritic.  Question whether related to recent medication use.  Patient was recently treated with an anti-inflammatory drug and a muscle relaxer for TMJ and her rash started after finishing the medication course. There is also suspicion for possible underlying vasculitis. -Check ESR and CRP levels -Patient will need a skin biopsy if no improvement.  Myalgia Patient reports soreness of all the muscle groups of her legs for the past 1 month.  No focal weakness.  Muscles nontender to palpation on exam.  Not on a statin.  CK normal.  No clinical signs of DVT on exam.  Corrected calcium within normal range. -Check  ESR, CRP, TSH, free T4, phosphorus, vitamin D, and a.m. cortisol level -PT evaluation -Tylenol PRN  Hyponatremia Corrected sodium 127.  Urine sodium 55.  Possibly due to decreased p.o. intake from recent TMJ pain.  Not on a thiazide diuretic. -IV fluid hydration -BMP every 4 hours.  Avoid rapid correction.  Goal correction 4-6 mEq in 24 hours. -Check serum osmolarity -Check urine osmolarity  Mild tachycardia Patient continues to be mildly tachycardic with heart rate in the low 100s despite receiving 1 L fluid bolus in the ED. EKG with sinus rhythm. -Cardiac monitoring -Check TSH and free T4 levels -Continue IV fluid hydration  Possible UTI -Afebrile.  White count 17.5 with left shift.  Lactic acid normal.  UA with small amount of leukocytes, 6-10 WBCs, rare bacteria, and negative nitrite.  Patient denies dysuria.  Reports chronic urinary urgency. -Received ceftriaxone in the ED.  Continue at this time. -Urine culture  Normocytic anemia Hemoglobin 11.2 and MCV 92, no recent baseline. -Anemia panel  Thrombocytosis Platelet count 584.  Possibly related to cigarette smoking. -Continue to monitor  Tobacco use -NicoDerm patch  Alcohol use Slightly tachycardic but not displaying any other signs of withdrawal at this time. -CIWA monitoring; Ativan PRN -Thiamine, folate, multivitamin  Diet controlled type 2 diabetes vs prediabetes  Chart mentions "borderline diabetes."  Random blood glucose 159.  Check A1c.  CBG checks.  COPD -Stable.  No wheezing or shortness of breath.  Continue home inhalers.  DVT prophylaxis: Lovenox Code Status: Patient wishes to be DNR. Family Communication: No family available. Disposition Plan: Anticipate discharge after clinical improvement. Consults called: None Admission status: Observation, telemetry  This chart was dictated using voice recognition software.  Despite best efforts to proofread, errors can occur which can change the documentation  meaning.  Shela Leff MD [facility] Pager [phone]  If 7PM-7AM, please contact night-coverage [email or web address] Password [facility]  06/10/2018, 7:47 PM

## 2018-06-10 NOTE — ED Notes (Signed)
ED TO INPATIENT HANDOFF REPORT  ED Nurse Name and Phone #: Judson Roch [phone]  S Name/Age/Gender Linda Ray 84 y.o. female Room/Bed: MH06/MH06  Code Status   Code Status: Not on file  Home/SNF/Other Home Patient oriented to: self, place, time and situation Is this baseline? Yes   Triage Complete: Triage complete  Chief Complaint legs cramping rash  Triage Note Pt c/o bil leg and thigh pain also c/o right "TMJ" pain and c/o leg and arm discoloration   Allergies Allergies  Allergen Reactions  . Codeine   . Sulfa Drugs Cross Reactors   . Tetanus-Diphth-Acell Pertussis Other (See Comments)    Significant local reaction    Level of Care/Admitting Diagnosis ED Disposition    ED Disposition Condition Comment   Admit  Hospital Area: [facility] [[id number]]  Level of Care: Med-Surg [16]  Diagnosis: Hyponatremia [536644]  Admitting Physician: Hosie Poisson [[id number]]  Attending Physician: Hosie Poisson [[id number]]  Possible Covid Disease Patient Isolation: N/A  PT Class (Do Not Modify): Observation [104]  PT Acc Code (Do Not Modify): Observation [10022]       B Medical/Surgery History Past Medical History:  Diagnosis Date  . Arthritis   . Borderline diabetes   . Breast cancer ([place])   . Chronic back pain   . RLS (restless legs syndrome)    Past Surgical History:  Procedure Laterality Date  . ABDOMINAL SURGERY  1954  . BACK SURGERY  2009  . BREAST SURGERY    . MASTECTOMY Left 2007  . MASTECTOMY    . TONSILLECTOMY       A IV Location/Drains/Wounds Patient Lines/Drains/Airways Status   Active Line/Drains/Airways    Name:   Placement date:   Placement time:   Site:   Days:   Peripheral IV 06/10/18 Right Antecubital   06/10/18    1351    Antecubital   less than 1          Intake/Output Last 24 hours  Intake/Output Summary (Last 24 hours) at 06/10/2018 1636 Last data filed at 06/10/2018 1523 Gross per 24 hour  Intake 1000 ml  Output -  Net  1000 ml    Labs/Imaging Results for orders placed or performed during the hospital encounter of 06/10/18 (from the past 48 hour(s))  Comprehensive metabolic panel     Status: Abnormal   Collection Time: 06/10/18  1:48 PM  Result Value Ref Range   Sodium 126 (L) 135 - 145 mmol/L   Potassium 4.6 3.5 - 5.1 mmol/L   Chloride 91 (L) 98 - 111 mmol/L   CO2 25 22 - 32 mmol/L   Glucose, Bld 159 (H) 70 - 99 mg/dL   BUN 15 8 - 23 mg/dL   Creatinine, Ser 0.76 0.44 - 1.00 mg/dL   Calcium 8.5 (L) 8.9 - 10.3 mg/dL   Total Protein 6.6 6.5 - 8.1 g/dL   Albumin 2.7 (L) 3.5 - 5.0 g/dL   AST 33 15 - 41 U/L   ALT 33 0 - 44 U/L   Alkaline Phosphatase 72 38 - 126 U/L   Total Bilirubin 0.3 0.3 - 1.2 mg/dL   GFR calc non Af Amer >60 >60 mL/min   GFR calc Af Amer >60 >60 mL/min   Anion gap 10 5 - 15    Comment: Performed at [facility], [address] [place], [place] [address]  CBC with Differential     Status: Abnormal   Collection Time: 06/10/18  1:48 PM  Result Value Ref Range   WBC 17.5 (H) 4.0 - 10.5 K/uL   RBC 3.70 (L) 3.87 - 5.11 MIL/uL   Hemoglobin 11.2 (L) 12.0 - 15.0 g/dL   HCT 34.2 (L) 36.0 - 46.0 %   MCV 92.4 80.0 - 100.0 fL   MCH 30.3 26.0 - 34.0 pg   MCHC 32.7 30.0 - 36.0 g/dL   RDW 13.2 11.5 - 15.5 %   Platelets 584 (H) 150 - 400 K/uL   nRBC 0.0 0.0 - 0.2 %   Neutrophils Relative % 80 %   Neutro Abs 14.1 (H) 1.7 - 7.7 K/uL   Lymphocytes Relative 9 %   Lymphs Abs 1.6 0.7 - 4.0 K/uL   Monocytes Relative 9 %   Monocytes Absolute 1.6 (H) 0.1 - 1.0 K/uL   Eosinophils Relative 1 %   Eosinophils Absolute 0.2 0.0 - 0.5 K/uL   Basophils Relative 0 %   Basophils Absolute 0.1 0.0 - 0.1 K/uL   Immature Granulocytes 1 %   Abs Immature Granulocytes 0.13 (H) 0.00 - 0.07 K/uL    Comment: Performed at [facility], [address] [place], [place] [address]  CK     Status: None   Collection Time: 06/10/18  1:48 PM  Result Value Ref Range   Total CK 52 38 - 234 U/L     Comment: Performed at [facility], [address] [place], [place] [address]  Urinalysis, Routine w reflex microscopic     Status: Abnormal   Collection Time: 06/10/18  2:50 PM  Result Value Ref Range   Color, Urine YELLOW YELLOW   APPearance CLEAR CLEAR   Specific Gravity, Urine 1.010 1.005 - 1.030   pH 6.0 5.0 - 8.0   Glucose, UA NEGATIVE NEGATIVE mg/dL   Hgb urine dipstick NEGATIVE NEGATIVE   Bilirubin Urine NEGATIVE NEGATIVE   Ketones, ur NEGATIVE NEGATIVE mg/dL   Protein, ur NEGATIVE NEGATIVE mg/dL   Nitrite NEGATIVE NEGATIVE   Leukocytes,Ua SMALL (A) NEGATIVE    Comment: Performed at [facility], [address] [place], [place] [address]  Urinalysis, Microscopic (reflex)     Status: Abnormal   Collection Time: 06/10/18  2:50 PM  Result Value Ref Range   RBC / HPF NONE SEEN 0 - 5 RBC/hpf   WBC, UA 6-10 0 - 5 WBC/hpf   Bacteria, UA RARE (A) NONE SEEN   Squamous Epithelial / LPF 0-5 0 - 5    Comment: Performed at [facility], [address] [place], [place] [address]  Lactic acid, plasma     Status: None   Collection Time: 06/10/18  3:46 PM  Result Value Ref Range   Lactic Acid, Venous 1.5 0.5 - 1.9 mmol/L    Comment: Performed at [facility], [address] [place], [place] [address]   Dg Chest 2 View  Result Date: 06/10/2018 CLINICAL DATA:  Bilateral leg pain.  Cough. EXAM: CHEST - 2 VIEW COMPARISON:  January 26, 2010 FINDINGS: The cardiomediastinal silhouette is stable with mild stable cardiomegaly. The hila and mediastinum are normal. No nodules or masses. No focal infiltrates. No acute abnormalities. IMPRESSION: No active cardiopulmonary disease. Electronically Signed   By: Dorise Bullion III M.D   On: 06/10/2018 15:19    Pending Labs Unresulted Labs (From admission, onward)    Start     Ordered   06/10/18 1605  Urine culture  ONCE - STAT,  STAT     06/10/18 1604   06/10/18 1528  Lactic acid, plasma  Now then  every 2 hours,   STAT     06/10/18 1527   06/10/18 1502  Sodium, urine, random  Once,   STAT     06/10/18 1501          Vitals/Pain Today's Vitals   06/10/18 1400 06/10/18 1430 06/10/18 1515 06/10/18 1600  BP: (!) 112/55 131/63 (!) 138/57 (!) 151/81  Pulse: 100 100  (!) 105  Resp: 15 (!) 27 (!) 28 (!) 22  Temp:    98.4 F (36.9 C)  TempSrc:    Oral  SpO2: 99% 100%  99%  Weight:      Height:      PainSc:        Isolation Precautions No active isolations  Medications Medications  cefTRIAXone (ROCEPHIN) 1 g in sodium chloride 0.9 % 100 mL IVPB (1 g Intravenous New Bag/Given 06/10/18 1608)  lactated ringers bolus 1,000 mL (0 mLs Intravenous Stopped 06/10/18 1523)    Mobility walks with device Low fall risk   Focused Assessments Cardiac Assessment Handoff:  Cardiac Rhythm: Normal sinus rhythm Lab Results  Component Value Date   CKTOTAL 52 06/10/2018   No results found for: DDIMER Does the Patient currently have chest pain? No     R Recommendations: See Admitting Provider Note  Report given to:   Additional Notes: None

## 2018-06-10 NOTE — ED Triage Notes (Signed)
Pt c/o bil leg and thigh pain also c/o right "TMJ" pain and c/o leg and arm discoloration

## 2018-06-10 NOTE — Progress Notes (Signed)
Request admission from [facility] for Ms Linda Ray for rash on the lower extremities, generalized weakness,  Bilateral leg pain, hyponatremia, tachycardia and mild UTI and possibly dehydration.    Accepted to telemetry at [facility].   Hosie Poisson, MD [phone]

## 2018-06-11 DIAGNOSIS — G2581 Restless legs syndrome: Secondary | ICD-10-CM | POA: Diagnosis present

## 2018-06-11 DIAGNOSIS — Z79899 Other long term (current) drug therapy: Secondary | ICD-10-CM | POA: Diagnosis not present

## 2018-06-11 DIAGNOSIS — G8929 Other chronic pain: Secondary | ICD-10-CM | POA: Diagnosis present

## 2018-06-11 DIAGNOSIS — Z9012 Acquired absence of left breast and nipple: Secondary | ICD-10-CM | POA: Diagnosis not present

## 2018-06-11 DIAGNOSIS — Z882 Allergy status to sulfonamides status: Secondary | ICD-10-CM | POA: Diagnosis not present

## 2018-06-11 DIAGNOSIS — R7989 Other specified abnormal findings of blood chemistry: Secondary | ICD-10-CM | POA: Diagnosis present

## 2018-06-11 DIAGNOSIS — Z887 Allergy status to serum and vaccine status: Secondary | ICD-10-CM | POA: Diagnosis not present

## 2018-06-11 DIAGNOSIS — Z7982 Long term (current) use of aspirin: Secondary | ICD-10-CM | POA: Diagnosis not present

## 2018-06-11 DIAGNOSIS — R3915 Urgency of urination: Secondary | ICD-10-CM | POA: Diagnosis present

## 2018-06-11 DIAGNOSIS — Z853 Personal history of malignant neoplasm of breast: Secondary | ICD-10-CM | POA: Diagnosis not present

## 2018-06-11 DIAGNOSIS — N3000 Acute cystitis without hematuria: Secondary | ICD-10-CM | POA: Diagnosis present

## 2018-06-11 DIAGNOSIS — F17210 Nicotine dependence, cigarettes, uncomplicated: Secondary | ICD-10-CM | POA: Diagnosis present

## 2018-06-11 DIAGNOSIS — Z66 Do not resuscitate: Secondary | ICD-10-CM | POA: Diagnosis present

## 2018-06-11 DIAGNOSIS — M549 Dorsalgia, unspecified: Secondary | ICD-10-CM | POA: Diagnosis present

## 2018-06-11 DIAGNOSIS — E871 Hypo-osmolality and hyponatremia: Secondary | ICD-10-CM | POA: Diagnosis present

## 2018-06-11 DIAGNOSIS — Z803 Family history of malignant neoplasm of breast: Secondary | ICD-10-CM | POA: Diagnosis not present

## 2018-06-11 DIAGNOSIS — J440 Chronic obstructive pulmonary disease with acute lower respiratory infection: Secondary | ICD-10-CM | POA: Diagnosis present

## 2018-06-11 DIAGNOSIS — D638 Anemia in other chronic diseases classified elsewhere: Secondary | ICD-10-CM | POA: Diagnosis present

## 2018-06-11 DIAGNOSIS — M26603 Bilateral temporomandibular joint disorder, unspecified: Secondary | ICD-10-CM | POA: Diagnosis present

## 2018-06-11 DIAGNOSIS — J189 Pneumonia, unspecified organism: Secondary | ICD-10-CM | POA: Diagnosis present

## 2018-06-11 DIAGNOSIS — R21 Rash and other nonspecific skin eruption: Secondary | ICD-10-CM | POA: Diagnosis present

## 2018-06-11 DIAGNOSIS — E119 Type 2 diabetes mellitus without complications: Secondary | ICD-10-CM | POA: Diagnosis present

## 2018-06-11 DIAGNOSIS — Z885 Allergy status to narcotic agent status: Secondary | ICD-10-CM | POA: Diagnosis not present

## 2018-06-11 DIAGNOSIS — M1990 Unspecified osteoarthritis, unspecified site: Secondary | ICD-10-CM | POA: Diagnosis present

## 2018-06-11 LAB — BASIC METABOLIC PANEL
Anion gap: 9 (ref 5–15)
Anion gap: 9 (ref 5–15)
Anion gap: 8 (ref 5–15)
BUN: 12 mg/dL (ref 8–23)
BUN: 13 mg/dL (ref 8–23)
BUN: 12 mg/dL (ref 8–23)
CO2: 23 mmol/L (ref 22–32)
CO2: 23 mmol/L (ref 22–32)
CO2: 23 mmol/L (ref 22–32)
Calcium: 8.1 mg/dL — ABNORMAL LOW (ref 8.9–10.3)
Calcium: 7.8 mg/dL — ABNORMAL LOW (ref 8.9–10.3)
Calcium: 8 mg/dL — ABNORMAL LOW (ref 8.9–10.3)
Chloride: 94 mmol/L — ABNORMAL LOW (ref 98–111)
Chloride: 96 mmol/L — ABNORMAL LOW (ref 98–111)
Chloride: 96 mmol/L — ABNORMAL LOW (ref 98–111)
Creatinine, Ser: 0.7 mg/dL (ref 0.44–1.00)
Creatinine, Ser: 0.72 mg/dL (ref 0.44–1.00)
Creatinine, Ser: 0.65 mg/dL (ref 0.44–1.00)
GFR calc Af Amer: >60 mL/min (ref >60)
GFR calc Af Amer: >60 mL/min (ref >60)
GFR calc Af Amer: >60 mL/min (ref >60)
GFR calc non Af Amer: >60 mL/min (ref >60)
GFR calc non Af Amer: >60 mL/min (ref >60)
GFR calc non Af Amer: >60 mL/min (ref >60)
Glucose, Bld: 140 mg/dL — ABNORMAL HIGH (ref 70–99)
Glucose, Bld: 157 mg/dL — ABNORMAL HIGH (ref 70–99)
Glucose, Bld: 223 mg/dL — ABNORMAL HIGH (ref 70–99)
Potassium: 4.5 mmol/L (ref 3.5–5.1)
Potassium: 4.3 mmol/L (ref 3.5–5.1)
Potassium: 4.5 mmol/L (ref 3.5–5.1)
Sodium: 126 mmol/L — ABNORMAL LOW (ref 135–145)
Sodium: 128 mmol/L — ABNORMAL LOW (ref 135–145)
Sodium: 127 mmol/L — ABNORMAL LOW (ref 135–145)

## 2018-06-11 LAB — PHOSPHORUS: Phosphorus: 3 mg/dL (ref 2.5–4.6)

## 2018-06-11 LAB — IRON AND TIBC
Iron: 15 ug/dL — ABNORMAL LOW (ref 28–170)
Saturation Ratios: 7 % — ABNORMAL LOW (ref 10.4–31.8)
TIBC: 201 ug/dL — ABNORMAL LOW (ref 250–450)
UIBC: 186 ug/dL

## 2018-06-11 LAB — GLUCOSE, CAPILLARY
Glucose-Capillary: 151 mg/dL — ABNORMAL HIGH (ref 70–99)
Glucose-Capillary: 170 mg/dL — ABNORMAL HIGH (ref 70–99)

## 2018-06-11 LAB — HEMOGLOBIN A1C
Hgb A1c MFr Bld: 8 % — ABNORMAL HIGH (ref 4.8–5.6)
Mean Plasma Glucose: 182.9 mg/dL

## 2018-06-11 LAB — RETICULOCYTES
Immature Retic Fract: 17.9 % — ABNORMAL HIGH (ref 2.3–15.9)
RBC.: 3.27 MIL/uL — ABNORMAL LOW (ref 3.87–5.11)
Retic Count, Absolute: 36 10*3/uL (ref 19.0–186.0)
Retic Ct Pct: 1.1 % (ref 0.4–3.1)

## 2018-06-11 LAB — URINE CULTURE

## 2018-06-11 LAB — CORTISOL-AM, BLOOD: Cortisol - AM: 15.5 ug/dL (ref 6.7–22.6)

## 2018-06-11 LAB — SEDIMENTATION RATE: Sed Rate: 92 mm/hr — ABNORMAL HIGH (ref 0–22)

## 2018-06-11 LAB — FOLATE: Folate: 19.8 ng/mL (ref >5.9)

## 2018-06-11 LAB — OSMOLALITY: Osmolality: 271 mOsm/kg — ABNORMAL LOW (ref 275–295)

## 2018-06-11 LAB — TSH: TSH: 2.789 u[IU]/mL (ref 0.350–4.500)

## 2018-06-11 LAB — T4, FREE: Free T4: 1.02 ng/dL (ref 0.82–1.77)

## 2018-06-11 LAB — VITAMIN B12: Vitamin B-12: 352 pg/mL (ref 180–914)

## 2018-06-11 LAB — FERRITIN: Ferritin: 383 ng/mL — ABNORMAL HIGH (ref 11–307)

## 2018-06-11 LAB — C-REACTIVE PROTEIN: CRP: 21.5 mg/dL — ABNORMAL HIGH (ref <1.0)

## 2018-06-11 MED ORDER — PREDNISONE 20 MG PO TABS
20.0000 mg | ORAL_TABLET | Freq: Every day | ORAL | Status: DC
  Administered 2018-06-11: 20 mg via ORAL
  Filled 2018-06-11 (×2): qty 1

## 2018-06-11 MED ORDER — ENSURE ENLIVE PO LIQD
237.0000 mL | Freq: Two times a day (BID) | ORAL | Status: DC
  Administered 2018-06-11: 237 mL via ORAL

## 2018-06-11 MED ORDER — INSULIN ASPART 100 UNIT/ML ~~LOC~~ SOLN
0.0000 [IU] | Freq: Three times a day (TID) | SUBCUTANEOUS | Status: DC
  Administered 2018-06-11: 3 [IU] via SUBCUTANEOUS

## 2018-06-11 MED ORDER — SODIUM CHLORIDE 0.9 % IV SOLN
INTRAVENOUS | Status: DC
  Administered 2018-06-12: 01:00:00 via INTRAVENOUS

## 2018-06-11 NOTE — Progress Notes (Addendum)
PROGRESS NOTE    Linda Ray  JGG:[id number] DOB: 1935-02-09 DOA: 06/10/2018 PCP: Vicenta Aly, FNP    Brief Narrative:  Linda Ray is a 84 y.o. female with medical history significant of arthritis, type 2 diabetes, breast cancer, chronic back pain, RLS presenting as a transfer from [place] ED for evaluation of bilateral leg soreness and rash.  Patient states the muscles in both of her legs (upper and lower) have been sore for the past 1 month which is making it difficult for her to get up and walk.  Denies any focal weakness.  States she has TMJ for which she was recently treated with an anti-inflammatory medication and a muscle relaxer by her dentist.  She finished her medication last week and a day later noticed a new rash on both of her legs.  This morning she noticed that the rash has now spread to both of her arms.  She has not noticed any rash on her chest, abdomen, or face.  The rash is nonpruritic.  Denies any fevers, chills, or new joint pains.  States she has chronic intermittent cough and shortness of breath secondary to COPD; no recent change and at baseline per patient.  Denies any chest pain, nausea, vomiting, or diarrhea.  Denies any dysuria.  Reports chronic urinary urgency.  Denies any history of blood clots.   Assessment & Plan:   Principal Problem:   Rash Active Problems:   Hyponatremia   Myalgia   Sinus tachycardia   UTI (urinary tract infection)   Hyponatremia Patient presenting as a transfer from [place] with noted sodium level of 125.  Patient with TMJ syndrome with reported poor oral intake over the last 1-2 weeks.  Not on a thiazide diuretic.  Urine sodium 55.  Serum osmolality 271. --Urine osmolality pending --Na improving 128-->128 --Continue IV fluid hydration with normal saline at 150 mL's per hour --Continue to monitor BMP every 8 hours; not to exceed Na correction of 4-6 meq in 24hrs  Rash Noted on bilateral lower and  upper extremities.  Appears somewhat reticulated.  Nonpruritic. Question whether related to recent medication use.  Patient was recently treated with an anti-inflammatory drug and a muscle relaxer for TMJ and her rash started after finishing the medication course. There is also suspicion for possible underlying vasculitis. --ESR elevated to 92 with elevated CRP of 21.5 --Check rheumatoid factor, ANA, complement C3/C4, ANCA, anti-CCP --Continue to monitor rash, if no improvement off of medication, will consider skin biopsy  Myalgia Patient reports soreness of all the muscle groups of her legs for the past 1 month.  No focal weakness.  Muscles nontender to palpation on exam.  Not on a statin.  CK normal.  No clinical signs of DVT on exam.  Corrected calcium within normal range. --ESR/CRP elevated to 92 and 21.5 respectively --TSH and free T4  normal at 2.79 and 1.02 respectively --A.m. cortisol normal at 15.5 --phosphorus normal at 3.0 --vitamin D level pending --Will start low-dose prednisone 20 mg p.o. daily for concern of possible underlying PMR --PT evaluation pending --Tylenol PRN  Possible UTI Afebrile.  White count 17.5 with left shift.  Lactic acid normal.  UA with small amount of leukocytes, 6-10 WBCs, rare bacteria, and negative nitrite.  Patient denies dysuria.  Reports chronic urinary urgency. --Continue ceftriaxone --Urine culture pending  Normocytic anemia Hemoglobin 11.2 and MCV 92, no recent baseline.  Iron level 15, TIBC 201, ferritin 383.  Labs consistent  with anemia of chronic disease.  Thrombocytosis Platelet count 584.  Possibly related to cigarette smoking. --Continue to monitor --Counseled on tobacco cessation  Tobacco use disorder --Consult on tobacco cessation --NicoDerm patch  Alcohol use Slightly tachycardic on admission but since has resolved; but not displaying any other signs of withdrawal at this time. --CIWA monitoring; Ativan PRN --Thiamine,  folate, multivitamin  Type 2 diabetes mellitus Chart mentions "borderline diabetes."  Diet controlled at home. Random blood glucose 159.  Hemoglobin A1c 8.0. --continue CBG checks --ISS for coverage  COPD --Stable.  No wheezing or shortness of breath.  Continue home inhalers.   DVT prophylaxis: Lovenox Code Status: Full code Family Communication: None Disposition Plan: Continue observation status, IV fluid hydration, plan discharge home in 1-2 days   Consultants:   None  Procedures:   None  Antimicrobials:   Ceftriaxone 4/14>>>   Subjective: Patient seen and examined at bedside, resting comfortably.  Continues with mild rash to arms and legs no significant worsening.  States continues with myalgias that is slightly improved over the past week.  Appetite improving.  No other specific complaints at this moment.  Denies headache, no visual changes, no chest pain, no palpitations, no shortness of breath, no abdominal pain, no weakness, no issues with bowel/bladder function, no paresthesias.  Objective: Vitals:   06/10/18 1811 06/10/18 2335 06/11/18 0006 06/11/18 0457  BP: (!) 146/67  (!) 114/58 132/62  Pulse: (!) 109 (!) 115 (!) 112 (!) 109  Resp: '16  17 20  '$ Temp: 98.3 F (36.8 C)  98.9 F (37.2 C) 98.4 F (36.9 C)  TempSrc: Oral  Oral   SpO2: 97%  98% 95%  Weight:      Height:        Intake/Output Summary (Last 24 hours) at 06/11/2018 1059 Last data filed at 06/10/2018 2036 Gross per 24 hour  Intake 1220 ml  Output -  Net 1220 ml   Filed Weights   06/10/18 1321  Weight: 62.1 kg    Examination:  General exam: Appears calm and comfortable  Respiratory system: Clear to auscultation. Respiratory effort normal. Cardiovascular system: S1 & S2 heard, RRR. No JVD, murmurs, rubs, gallops or clicks. No pedal edema. Gastrointestinal system: Abdomen is nondistended, soft and nontender. No organomegaly or masses felt. Normal bowel sounds heard. Central nervous  system: Alert and oriented. No focal neurological deficits. Extremities: Symmetric 5 x 5 power. Skin: Rash noted on bilateral lower and upper extremities.  Appears somewhat reticulated.  No rash noted on trunk or face.  Psychiatry: Judgement and insight appear normal. Mood & affect appropriate.     Data Reviewed: I have personally reviewed following labs and imaging studies  CBC: Recent Labs  Lab 06/10/18 1348  WBC 17.5*  NEUTROABS 14.1*  HGB 11.2*  HCT 34.2*  MCV 92.4  PLT 170*   Basic Metabolic Panel: Recent Labs  Lab 06/10/18 1348 06/10/18 1922 06/10/18 2224 06/11/18 0117 06/11/18 0908  NA 126* 127* 127* 126* 128*  K 4.6 4.8 4.5 4.5 4.3  CL 91* 93* 94* 94* 96*  CO2 '25 24 24 23 23  '$ GLUCOSE 159* 170* 159* 140* 157*  BUN '15 14 14 12 13  '$ CREATININE 0.76 0.68 0.74 0.70 0.72  CALCIUM 8.5* 8.3* 8.1* 8.1* 7.8*  PHOS  --   --   --  3.0  --    GFR: Estimated Creatinine Clearance: 45.8 mL/min (by C-G formula based on SCr of 0.72 mg/dL). Liver Function Tests: Recent Labs  Lab  06/10/18 1348  AST 33  ALT 33  ALKPHOS 72  BILITOT 0.3  PROT 6.6  ALBUMIN 2.7*   No results for input(s): LIPASE, AMYLASE in the last 168 hours. No results for input(s): AMMONIA in the last 168 hours. Coagulation Profile: No results for input(s): INR, PROTIME in the last 168 hours. Cardiac Enzymes: Recent Labs  Lab 06/10/18 1348  CKTOTAL 52   BNP (last 3 results) No results for input(s): PROBNP in the last 8760 hours. HbA1C: Recent Labs    06/11/18 0610  HGBA1C 8.0*   CBG: Recent Labs  Lab 06/10/18 2044 06/11/18 0805  GLUCAP 179* 151*   Lipid Profile: No results for input(s): CHOL, HDL, LDLCALC, TRIG, CHOLHDL, LDLDIRECT in the last 72 hours. Thyroid Function Tests: Recent Labs    06/11/18 0610  TSH 2.789  FREET4 1.02   Anemia Panel: Recent Labs    06/11/18 0610  VITAMINB12 352  FOLATE 19.8  FERRITIN 383*  TIBC 201*  IRON 15*  RETICCTPCT 1.1   Sepsis Labs:  Recent Labs  Lab 06/10/18 1546 06/10/18 1910  LATICACIDVEN 1.5 1.4    No results found for this or any previous visit (from the past 240 hour(s)).       Radiology Studies: Dg Chest 2 View  Result Date: 06/10/2018 CLINICAL DATA:  Bilateral leg pain.  Cough. EXAM: CHEST - 2 VIEW COMPARISON:  January 26, 2010 FINDINGS: The cardiomediastinal silhouette is stable with mild stable cardiomegaly. The hila and mediastinum are normal. No nodules or masses. No focal infiltrates. No acute abnormalities. IMPRESSION: No active cardiopulmonary disease. Electronically Signed   By: Dorise Bullion III M.D   On: 06/10/2018 15:19        Scheduled Meds: . aspirin  81 mg Oral Daily  . enoxaparin (LOVENOX) injection  40 mg Subcutaneous Q24H  . feeding supplement (ENSURE ENLIVE)  237 mL Oral BID BM  . folic acid  1 mg Oral Daily  . multivitamin with minerals  1 tablet Oral Daily  . nicotine  21 mg Transdermal Daily  . predniSONE  20 mg Oral Q breakfast  . thiamine  100 mg Oral Daily   Or  . thiamine  100 mg Intravenous Daily  . umeclidinium bromide  1 puff Inhalation Daily   Continuous Infusions: . sodium chloride    . sodium chloride    . cefTRIAXone (ROCEPHIN)  IV       LOS: 0 days    Time spent: 28 minutes   Geofrey Silliman J [place] DO [facility] Pager [phone]  If 7PM-7AM, please contact night-coverage [email or web address] Password [facility] 06/11/2018, 10:59 AM

## 2018-06-11 NOTE — Evaluation (Addendum)
Physical Therapy Evaluation Patient Details Name: Linda Ray MRN: [id number] DOB: Sep 28, 1934 Today's Date: 06/11/2018   History of Present Illness  84 y.o. female with medical history significant of arthritis, type 2 diabetes, breast cancer, chronic back pain, RLS presenting as a transfer from [place] ED for evaluation of bilateral leg soreness and rash  Clinical Impression  Pt ambulated 120' with her rollator, with no loss of balance. She is modified independent with mobility, no further PT indicated. She reports no pain at rest, but 8/10 pain in B hips/thighs with sit to stand, no pain with palpation to these areas. Noted workup is pending.  From PT standpoint, she is ready to DC home. She reports her son and daughter are able to assist her with grocery shopping, etc. No further PT indicated, PT signing off.     Follow Up Recommendations No PT follow up    Equipment Recommendations  None recommended by PT    Recommendations for Other Services       Precautions / Restrictions Precautions Precautions: None Precaution Comments: pt denies falls in past 1 year Restrictions Weight Bearing Restrictions: No      Mobility  Bed Mobility Overal bed mobility: Independent                Transfers Overall transfer level: Modified independent Equipment used: 4-wheeled walker             General transfer comment: 8/10 pain B hips/thighs with sit to stand (pt reports no pain at rest)  Ambulation/Gait Ambulation/Gait assistance: Modified independent (Device/Increase time) Gait Distance (Feet): 120 Feet Assistive device: 4-wheeled walker Gait Pattern/deviations: Step-through pattern;Decreased stride length     General Gait Details: steady with rollator, no loss of balance  Stairs            Wheelchair Mobility    Modified Rankin (Stroke Patients Only)       Balance Overall balance assessment: Modified Independent                                            Pertinent Vitals/Pain Pain Assessment: 0-10 Pain Score: 8  Pain Location: B hips/thighs with sit to stand Pain Descriptors / Indicators: Sore Pain Intervention(s): Limited activity within patient's tolerance;Monitored during session(pt declined pain medicine)    Home Living Family/patient expects to be discharged to:: Private residence Living Arrangements: Alone Available Help at Discharge: Family;Available PRN/intermittently(son and daughter can assist as needed, they've been helping to get groceries)   Home Access: Stairs to enter Entrance Stairs-Rails: Right Entrance Stairs-Number of Steps: 4 Home Layout: One level Home Equipment: Walker - 4 wheels;Bedside commode;Shower seat      Prior Function Level of Independence: Independent with assistive device(s)         Comments: has been using rollator for past 1 week     Hand Dominance        Extremity/Trunk Assessment   Upper Extremity Assessment Upper Extremity Assessment: Overall WFL for tasks assessed    Lower Extremity Assessment Lower Extremity Assessment: RLE deficits/detail;LLE deficits/detail RLE Deficits / Details: hip flexion 3/5, knee ext 4/5, both limited by pain in hip/thigh;  LLE Deficits / Details: hip flexion 3/5, knee ext 4/5, both limited by pain in hip/thigh       Communication   Communication: No difficulties  Cognition Arousal/Alertness: Awake/alert Behavior During Therapy: [facility] for tasks  assessed/performed Overall Cognitive Status: Within Functional Limits for tasks assessed                                        General Comments      Exercises     Assessment/Plan    PT Assessment Patent does not need any further PT services  PT Problem List         PT Treatment Interventions      PT Goals (Current goals can be found in the Care Plan section)  Acute Rehab PT Goals PT Goal Formulation: All assessment and education complete, DC  therapy    Frequency     Barriers to discharge        Co-evaluation               AM-PAC PT "6 Clicks" Mobility  Outcome Measure Help needed turning from your back to your side while in a flat bed without using bedrails?: None Help needed moving from lying on your back to sitting on the side of a flat bed without using bedrails?: None Help needed moving to and from a bed to a chair (including a wheelchair)?: None Help needed standing up from a chair using your arms (e.g., wheelchair or bedside chair)?: None Help needed to walk in hospital room?: None Help needed climbing 3-5 steps with a railing? : None 6 Click Score: 24    End of Session Equipment Utilized During Treatment: Gait belt Activity Tolerance: Patient tolerated treatment well Patient left: in bed;with call bell/phone within reach;with nursing/sitter in room Nurse Communication: Mobility status      Time: 1145-1200 PT Time Calculation (min) (ACUTE ONLY): 15 min   Charges:   PT Evaluation $PT Eval Low Complexity: 1 Low          Philomena Doheny PT 06/11/2018  Acute Rehabilitation Services Pager [phone] Office [phone]

## 2018-06-11 NOTE — Care Management Obs Status (Signed)
[facility] NOTIFICATION   Patient Details  Name: Linda Ray MRN: [id number] Date of Birth: 20-Mar-1934   Medicare Observation Status Notification Given:  Yes    Leeroy Cha, RN 06/11/2018, 9:18 AM

## 2018-06-12 ENCOUNTER — Inpatient Hospital Stay (HOSPITAL_COMMUNITY): Payer: Medicare Other

## 2018-06-12 DIAGNOSIS — J189 Pneumonia, unspecified organism: Secondary | ICD-10-CM | POA: Diagnosis present

## 2018-06-12 LAB — BASIC METABOLIC PANEL
Anion gap: 8 (ref 5–15)
BUN: 14 mg/dL (ref 8–23)
CO2: 22 mmol/L (ref 22–32)
Calcium: 8 mg/dL — ABNORMAL LOW (ref 8.9–10.3)
Chloride: 101 mmol/L (ref 98–111)
Creatinine, Ser: 0.58 mg/dL (ref 0.44–1.00)
GFR calc Af Amer: >60 mL/min (ref >60)
GFR calc non Af Amer: >60 mL/min (ref >60)
Glucose, Bld: 156 mg/dL — ABNORMAL HIGH (ref 70–99)
Potassium: 4.3 mmol/L (ref 3.5–5.1)
Sodium: 131 mmol/L — ABNORMAL LOW (ref 135–145)

## 2018-06-12 LAB — C4 COMPLEMENT: Complement C4, Body Fluid: 15 mg/dL (ref 14–44)

## 2018-06-12 LAB — C3 COMPLEMENT: C3 Complement: 110 mg/dL (ref 82–167)

## 2018-06-12 LAB — GLUCOSE, CAPILLARY
Glucose-Capillary: 196 mg/dL — ABNORMAL HIGH (ref 70–99)
Glucose-Capillary: 216 mg/dL — ABNORMAL HIGH (ref 70–99)
Glucose-Capillary: 174 mg/dL — ABNORMAL HIGH (ref 70–99)
Glucose-Capillary: 128 mg/dL — ABNORMAL HIGH (ref 70–99)

## 2018-06-12 LAB — MPO/PR-3 (ANCA) ANTIBODIES
ANCA Proteinase 3: <3.5 U/mL (ref 0.0–3.5)
Myeloperoxidase Abs: 10.9 U/mL — ABNORMAL HIGH (ref 0.0–9.0)

## 2018-06-12 LAB — ANA W/REFLEX IF POSITIVE: Anti Nuclear Antibody (ANA): NEGATIVE

## 2018-06-12 LAB — VITAMIN D 25 HYDROXY (VIT D DEFICIENCY, FRACTURES): Vit D, 25-Hydroxy: 19.2 ng/mL — ABNORMAL LOW (ref 30.0–100.0)

## 2018-06-12 LAB — CBC
HCT: 31.2 % — ABNORMAL LOW (ref 36.0–46.0)
Hemoglobin: 10 g/dL — ABNORMAL LOW (ref 12.0–15.0)
MCH: 30.3 pg (ref 26.0–34.0)
MCHC: 32.1 g/dL (ref 30.0–36.0)
MCV: 94.5 fL (ref 80.0–100.0)
Platelets: 597 10*3/uL — ABNORMAL HIGH (ref 150–400)
RBC: 3.3 MIL/uL — ABNORMAL LOW (ref 3.87–5.11)
RDW: 13.2 % (ref 11.5–15.5)
WBC: 13.9 10*3/uL — ABNORMAL HIGH (ref 4.0–10.5)
nRBC: 0 % (ref 0.0–0.2)

## 2018-06-12 LAB — CYCLIC CITRUL PEPTIDE ANTIBODY, IGG/IGA: CCP Antibodies IgG/IgA: 7 units (ref 0–19)

## 2018-06-12 LAB — RHEUMATOID FACTOR: Rheumatoid fact SerPl-aCnc: 30.2 IU/mL — ABNORMAL HIGH (ref 0.0–13.9)

## 2018-06-12 MED ORDER — AZITHROMYCIN 500 MG PO TABS: 500.0000 mg | ORAL_TABLET | Freq: Every day | ORAL | 0 refills | Status: AC

## 2018-06-12 MED ORDER — CEFDINIR 300 MG PO CAPS: 300.0000 mg | ORAL_CAPSULE | Freq: Two times a day (BID) | ORAL | 0 refills | Status: AC

## 2018-06-12 MED ORDER — UMECLIDINIUM BROMIDE 62.5 MCG/INH IN AEPB: 1.0000 | INHALATION_SPRAY | Freq: Every day | RESPIRATORY_TRACT | 0 refills | Status: DC

## 2018-06-12 MED ORDER — FUROSEMIDE 10 MG/ML IJ SOLN
20.0000 mg | Freq: Once | INTRAMUSCULAR | Status: AC
  Administered 2018-06-12: 20 mg via INTRAVENOUS
  Filled 2018-06-12: qty 2

## 2018-06-12 MED ORDER — ALBUTEROL SULFATE HFA 108 (90 BASE) MCG/ACT IN AERS: 1.0000 | INHALATION_SPRAY | Freq: Four times a day (QID) | RESPIRATORY_TRACT | 0 refills | Status: AC | PRN

## 2018-06-12 MED ORDER — METOPROLOL TARTRATE 5 MG/5ML IV SOLN
5.0000 mg | Freq: Once | INTRAVENOUS | Status: AC
  Administered 2018-06-12: 5 mg via INTRAVENOUS
  Filled 2018-06-12: qty 5

## 2018-06-12 NOTE — Discharge Instructions (Signed)
Hyponatremia Hyponatremia is when the amount of salt (sodium) in your blood is too low. When sodium levels are low, your cells absorb extra water and they swell. The swelling happens throughout the body, but it mostly affects the brain. What are the causes? This condition may be caused by:  Heart, kidney, or liver problems.  Thyroid problems.  Adrenal gland problems.  Metabolic conditions, such as syndrome of inappropriate antidiuretic hormone (SIADH).  Severe vomiting and diarrhea.  Certain medicines or illegal drugs.  Dehydration.  Drinking too much water.  Eating a diet that is low in sodium.  Large burns on your body.  Sweating. What increases the risk? This condition is more likely to develop in people who:  Have long-term (chronic) kidney disease.  Have heart failure.  Have a medical condition that causes frequent or excessive diarrhea.  Have metabolic conditions, such as Addison disease or SIADH.  Take certain medicines that affect the sodium and fluid balance in the blood. Some of these medicine types include: ? Diuretics. ? NSAIDs. ? Some opioid pain medicines. ? Some antidepressants. ? Some seizure prevention medicines. What are the signs or symptoms? Symptoms of this condition include:  Nausea and vomiting.  Confusion.  Lethargy.  Agitation.  Headache.  Seizures.  Unconsciousness.  Appetite loss.  Muscle weakness and cramping.  Feeling weak or light-headed.  Having a rapid heart rate.  Fainting, in severe cases. How is this diagnosed? This condition is diagnosed with a medical history and physical exam. You will also have other tests, including:  Blood tests.  Urine tests. How is this treated? Treatment for this condition depends on the cause. Treatment may include:  Fluids given through an IV tube that is inserted into one of your veins.  Medicines to correct the sodium imbalance. If medicines are causing the condition, the  medicines will need to be adjusted.  Limiting water or fluid intake to get the correct sodium balance. Follow these instructions at home:  Take medicines only as directed by your health care provider. Many medicines can make this condition worse. Talk with your health care provider about any medicines that you are currently taking.  Carefully follow a recommended diet as directed by your health care provider.  Carefully follow instructions from your health care provider about fluid restrictions.  Keep all follow-up visits as directed by your health care provider. This is important.  Do not drink alcohol. Contact a health care provider if:  You develop worsening nausea, fatigue, headache, confusion, or weakness.  Your symptoms go away and then return.  You have problems following the recommended diet. Get help right away if:  You have a seizure.  You faint.  You have ongoing diarrhea or vomiting. This information is not intended to replace advice given to you by your health care provider. Make sure you discuss any questions you have with your health care provider. Document Released: 02/02/2002 Document Revised: 07/21/2015 Document Reviewed: 03/04/2014 Elsevier Interactive Patient Education  2019 [place] Pneumonia, Adult Pneumonia is an infection of the lungs. It causes swelling in the airways of the lungs. Mucus and fluid may also build up inside the airways. One type of pneumonia can happen while a person is in a hospital. A different type can happen when a person is not in a hospital (community-acquired pneumonia).  What are the causes?  This condition is caused by germs (viruses, bacteria, or fungi). Some types of germs can be passed from one person to another. This  can happen when you breathe in droplets from the cough or sneeze of an infected person. What increases the risk? You are more likely to develop this condition if you:  Have a long-term  (chronic) disease, such as: ? Chronic obstructive pulmonary disease (COPD). ? Asthma. ? Cystic fibrosis. ? Congestive heart failure. ? Diabetes. ? Kidney disease.  Have HIV.  Have sickle cell disease.  Have had your spleen removed.  Do not take good care of your teeth and mouth (poor dental hygiene).  Have a medical condition that increases the risk of breathing in droplets from your own mouth and nose.  Have a weakened body defense system (immune system).  Are a smoker.  Travel to areas where the germs that cause this illness are common.  Are around certain animals or the places they live. What are the signs or symptoms?  A dry cough.  A wet (productive) cough.  Fever.  Sweating.  Chest pain. This often happens when breathing deeply or coughing.  Fast breathing or trouble breathing.  Shortness of breath.  Shaking chills.  Feeling tired (fatigue).  Muscle aches. How is this treated? Treatment for this condition depends on many things. Most adults can be treated at home. In some cases, treatment must happen in a hospital. Treatment may include:  Medicines given by mouth or through an IV tube.  Being given extra oxygen.  Respiratory therapy. In rare cases, treatment for very bad pneumonia may include:  Using a machine to help you breathe.  Having a procedure to remove fluid from around your lungs. Follow these instructions at home: Medicines  Take over-the-counter and prescription medicines only as told by your doctor. ? Only take cough medicine if you are losing sleep.  If you were prescribed an antibiotic medicine, take it as told by your doctor. Do not stop taking the antibiotic even if you start to feel better. General instructions   Sleep with your head and neck raised (elevated). You can do this by sleeping in a recliner or by putting a few pillows under your head.  Rest as needed. Get at least 8 hours of sleep each night.  Drink enough  water to keep your pee (urine) pale yellow.  Eat a healthy diet that includes plenty of vegetables, fruits, whole grains, low-fat dairy products, and lean protein.  Do not use any products that contain nicotine or tobacco. These include cigarettes, e-cigarettes, and chewing tobacco. If you need help quitting, ask your doctor.  Keep all follow-up visits as told by your doctor. This is important. How is this prevented? A shot (vaccine) can help prevent pneumonia. Shots are often suggested for:  People older than 82 years of age.  People older than 82 years of age who: ? Are having cancer treatment. ? Have long-term (chronic) lung disease. ? Have problems with their body's defense system. You may also prevent pneumonia if you take these actions:  Get the flu (influenza) shot every year.  Go to the dentist as often as told.  Wash your hands often. If you cannot use soap and water, use hand sanitizer. Contact a doctor if:  You have a fever.  You lose sleep because your cough medicine does not help. Get help right away if:  You are short of breath and it gets worse.  You have more chest pain.  Your sickness gets worse. This is very serious if: ? You are an older adult. ? Your body's defense system is weak.  You cough  up blood. Summary  Pneumonia is an infection of the lungs.  Most adults can be treated at home. Some will need treatment in a hospital.  Drink enough water to keep your pee pale yellow.  Get at least 8 hours of sleep each night. This information is not intended to replace advice given to you by your health care provider. Make sure you discuss any questions you have with your health care provider. Document Released: 08/01/2007 Document Revised: 10/10/2017 Document Reviewed: 10/10/2017 Elsevier Interactive Patient Education  2019 [employer].

## 2018-06-12 NOTE — Discharge Summary (Signed)
Physician Discharge Summary  Linda Ray:[id number] DOB: [birth year] DOA: 06/10/2018  PCP: Vicenta Aly, FNP  Admit date: 06/10/2018 Discharge date: 06/12/2018  Admitted From: Home Disposition: Home  Recommendations for Outpatient Follow-up:  1. Follow up with PCP in 1-2 weeks 2. Please obtain BMP in one week; continue monitoring of sodium level 3. Please follow up on the following pending results: Urine osmolality, ANA, ANCA, anti-CCP 4. Patient with hemoglobin A1c of 8.0, recommend patient start oral hypoglycemic agent; will defer to PCP.  Home Health: None Equipment/Devices: Rollator  Discharge Condition: Stable CODE STATUS: Full code Diet recommendation: Heart healthy diet  History of present illness:  Linda S Loweis a 84 y.o.femalewith medical history significant of arthritis, type 2 diabetes, breast cancer, chronic back pain, RLS presenting as a transfer from [facility] for evaluation of bilateral leg soreness and rash. Patient states the muscles in both of her legs (upper and lower) have been sore for the past 1 month which is making it difficult for her to get up and walk. Denies any focal weakness. States she has TMJ for which she was recently treated with an anti-inflammatory medication and a muscle relaxer by her dentist. She finished her medication last week and a day later noticed a new rash on both of her legs. This morning she noticed that the rash has now spread to both of her arms. She has not noticed any rash on her chest, abdomen, or face. The rash is nonpruritic. Denies any fevers, chills, or new joint pains. States she has chronic intermittent cough and shortness of breath secondary to COPD; no recent change and at baseline per patient. Denies any chest pain, nausea, vomiting, or diarrhea. Denies any dysuria. Reports chronic urinary urgency. Denies any history of blood clots.  Hospital course: Hyponatremia Patient presenting as a  transfer from [facility] with noted sodium level of 125. Patient with TMJ syndrome with reported poor oral intake over the last 1-2 weeks.  Not on a thiazide diuretic.  Urine sodium 55.  Serum osmolality 271.  Patient was started on IV fluid hydration with improvement of her sodium level to 131 at time of discharge.  Urine osmolality was pending at time of discharge.  Recommend repeat BMP in 1-2 weeks at PCP visit.  Rash Noted on bilateral lower and upper extremities.Appears somewhat reticulated. Nonpruritic. Question whether related to recent medication use. Patient was recently treated with an anti-inflammatory drug and a muscle relaxer for TMJ and her rash started after finishing the medication course. There is also suspicion for possible underlying vasculitis. ESR elevated to 92 with elevated CRP of 21.5.  Complement levels C3-C4 within normal limits.  Pending ANA, ANCA and anti-CCP at time of discharge.  Patient instructed to continue to monitor rash; and if no improvement will likely need skin biopsy for further determination.   Myalgia Patient reports soreness of all the muscle groups of her legs for the past 1 month. No focal weakness. Muscles nontender to palpation on exam. Not on a statin.CK normal.No clinical signs of DVT on exam.Corrected calcium within normal range. ESR/CRP elevated to 92 and 21.5 respectively. TSH and free T4 normal at 2.79 and 1.02 respectively. A.m. cortisol normal at 15.5. Phosphorus normal at 3.0.  Concern for possible PMR, started patient on prednisone 20 mg p.o. daily but patient declined given concern about her blood sugars.  Her rheumatoid factor was slightly elevated at 30.2.  Pending ANA, ANCA, anti-CCP at time of discharge.  Consider outpatient rheumatology evaluation following discharge for further evaluation if indicated.  Acute cystitis without hematuria Afebrile. White count 17.5 with left shift. Lactic acid normal. UA with small  amount of leukocytes, 6-10 WBCs, rare bacteria, and negative nitrite. Patient denies dysuria. Reports chronic urinary urgency.  Urine culture notable for multiple species. Started on ceftriaxone and will continue antibiotics with cefdinir following discharge.  Community-acquired pneumonia WBC count elevated to 17.5 on admission.  Patient with some shortness of breath and nonproductive cough.  Chest x-ray notable for bilateral haziness.  Patient was given 1 dose of IV furosemide 20 mg given significant IV fluid hydration due to hyponatremia while inpatient.  We will continue antibiotics with azithromycin and cefdinir following discharge.  Normocytic anemia Hemoglobin 11.2 and MCV 92, no recent baseline.  Iron level 15, TIBC 201, ferritin 383.  Labs consistent with anemia of chronic disease.  Thrombocytosis Platelet count 584. Possibly related to cigarette smoking. Counseled on tobacco cessation  Tobacco use disorder Counseled on need for tobacco cessation.  Type 2 diabetes mellitus Chart mentions"borderline diabetes."Diet controlled at home. Random blood glucose 159.Hemoglobin A1c 8.0.  Recommend follow-up with PCP following discharge, may need start on metformin versus other oral hypoglycemics.  COPD Patient reports only on albuterol as needed at home.  Started on Incruse Ellipta with good effect.  Will discharge home with new prescriptions of Incruse Ellipta and albuterol as needed.   Discharge Diagnoses:  Principal Problem:   Rash Active Problems:   Hyponatremia   Myalgia   Sinus tachycardia   UTI (urinary tract infection)   Pneumonia    Discharge Instructions  Discharge Instructions    Call MD for:  difficulty breathing, headache or visual disturbances   Complete by:  As directed    Call MD for:  persistant dizziness or light-headedness   Complete by:  As directed    Call MD for:  persistant nausea and vomiting   Complete by:  As directed    Call MD for:   temperature >100.4   Complete by:  As directed    Diet - low sodium heart healthy   Complete by:  As directed    Increase activity slowly   Complete by:  As directed      Allergies as of 06/12/2018      Reactions   Codeine    Sulfa Drugs Cross Reactors    Tetanus-diphth-acell Pertussis Other (See Comments)   Significant local reaction      Medication List    STOP taking these medications   Tiotropium Bromide Monohydrate 1.25 MCG/ACT Aers Commonly known as:  Spiriva Respimat     TAKE these medications   albuterol 108 (90 Base) MCG/ACT inhaler Commonly known as:  PROVENTIL HFA;VENTOLIN HFA Inhale 1-2 puffs into the lungs every 6 (six) hours as needed for wheezing or shortness of breath.   aspirin 81 MG tablet Take 81 mg by mouth daily.   azithromycin 500 MG tablet Commonly known as:  Zithromax Take 1 tablet (500 mg total) by mouth daily for 3 days. Take 1 tablet daily for 3 days.   B-complex with vitamin C tablet Take 1 tablet by mouth daily.   cefdinir 300 MG capsule Commonly known as:  OMNICEF Take 1 capsule (300 mg total) by mouth 2 (two) times daily for 3 days.   umeclidinium bromide 62.5 MCG/INH Aepb Commonly known as:  INCRUSE ELLIPTA Inhale 1 puff into the lungs daily. Start taking on:  June 13, 2018  Follow-up Information    Vicenta Aly, [place]. Call in 1 week(s).   Specialty:  Nurse Practitioner Contact information: [place] [address] [phone]          Allergies  Allergen Reactions  . Codeine   . Sulfa Drugs Cross Reactors   . Tetanus-Diphth-Acell Pertussis Other (See Comments)    Significant local reaction    Consultations:  none   Procedures/Studies: Dg Chest 2 View  Result Date: 06/10/2018 CLINICAL DATA:  Bilateral leg pain.  Cough. EXAM: CHEST - 2 VIEW COMPARISON:  January 26, 2010 FINDINGS: The cardiomediastinal silhouette is stable with mild stable cardiomegaly. The hila and  mediastinum are normal. No nodules or masses. No focal infiltrates. No acute abnormalities. IMPRESSION: No active cardiopulmonary disease. Electronically Signed   By: Dorise Bullion III M.D   On: 06/10/2018 15:19   Dg Chest Port 1 View  Result Date: 06/12/2018 CLINICAL DATA:  Rhonchi. EXAM: PORTABLE CHEST 1 VIEW COMPARISON:  Two-view chest x-ray 06/10/2018 FINDINGS: The heart is enlarged. Atherosclerotic calcifications are present at the aortic arch. Chronic interstitial coarsening is again seen. Superimposed bibasilar airspace disease is present. Small effusions are suspected. The visualized soft tissues and bony thorax are unremarkable. IMPRESSION: 1. New bibasilar airspace disease superimposed on chronic interstitial coarsening. While this could represent mild congestive heart failure, infection is not excluded. 2. Probable small bilateral pleural effusions. 3. Stable cardiomegaly and aortic atherosclerosis. Electronically Signed   By: San Morelle M.D.   On: 06/12/2018 04:50      Subjective: Patient seen and examined at bedside, breathing improved.  Sodium up to 131.  Appetite improved.  Ready for discharge home.  No other complaints at this time.  Denies headache, no fever/chills/night sweats, no nausea/vomiting/diarrhea, no chest pain, no palpitations, no shortness of breath, no abdominal pain.  No acute events overnight per nursing staff.   Discharge Exam: Vitals:   06/12/18 0418 06/12/18 0549  BP: (!) 159/74 (!) 150/70  Pulse: (!) 107 100  Resp: 16 16  Temp: 97.7 F (36.5 C) 98.5 F (36.9 C)  SpO2: 100% 99%   Vitals:   06/11/18 1931 06/12/18 0342 06/12/18 0418 06/12/18 0549  BP: 139/82 (!) 190/92 (!) 159/74 (!) 150/70  Pulse: (!) 102 (!) 119 (!) 107 100  Resp: '16 12 16 16  '$ Temp: 98 F (36.7 C) 98.3 F (36.8 C) 97.7 F (36.5 C) 98.5 F (36.9 C)  TempSrc: Oral Oral Oral Oral  SpO2: 99% 98% 100% 99%  Weight:      Height:        General: Pt is alert, awake, not in  acute distress Cardiovascular: RRR, S1/S2 +, no rubs, no gallops Respiratory: CTA bilaterally, no wheezing, no rhonchi Abdominal: Soft, NT, ND, bowel sounds + Extremities: no edema, no cyanosis    The results of significant diagnostics from this hospitalization (including imaging, microbiology, ancillary and laboratory) are listed below for reference.     Microbiology: Recent Results (from the past 240 hour(s))  Urine culture     Status: Abnormal   Collection Time: 06/10/18  2:50 PM  Result Value Ref Range Status   Specimen Description   Final    URINE, RANDOM Performed at [facility], [address] [place], [place] [address]    Special Requests   Final    NONE Performed at [facility], [address] [place], [place] [address]    Culture MULTIPLE SPECIES PRESENT, SUGGEST  RECOLLECTION (A)  Final   Report Status 06/11/2018 FINAL  Final     Labs: BNP (last 3 results) No results for input(s): BNP in the last 8760 hours. Basic Metabolic Panel: Recent Labs  Lab 06/10/18 2224 06/11/18 0117 06/11/18 0908 06/11/18 1736 06/12/18 0019  NA 127* 126* 128* 127* 131*  K 4.5 4.5 4.3 4.5 4.3  CL 94* 94* 96* 96* 101  CO2 '24 23 23 23 22  '$ GLUCOSE 159* 140* 157* 223* 156*  BUN '14 12 13 12 14  '$ CREATININE 0.74 0.70 0.72 0.65 0.58  CALCIUM 8.1* 8.1* 7.8* 8.0* 8.0*  PHOS  --  3.0  --   --   --    Liver Function Tests: Recent Labs  Lab 06/10/18 1348  AST 33  ALT 33  ALKPHOS 72  BILITOT 0.3  PROT 6.6  ALBUMIN 2.7*   No results for input(s): LIPASE, AMYLASE in the last 168 hours. No results for input(s): AMMONIA in the last 168 hours. CBC: Recent Labs  Lab 06/10/18 1348 06/12/18 0019  WBC 17.5* 13.9*  NEUTROABS 14.1*  --   HGB 11.2* 10.0*  HCT 34.2* 31.2*  MCV 92.4 94.5  PLT 584* 597*   Cardiac Enzymes: Recent Labs  Lab 06/10/18 1348  CKTOTAL 52   BNP: Invalid input(s): POCBNP CBG: Recent Labs  Lab 06/10/18 2044 06/11/18 0805  06/11/18 1159  GLUCAP 179* 151* 170*   D-Dimer No results for input(s): DDIMER in the last 72 hours. Hgb A1c Recent Labs    06/11/18 0610  HGBA1C 8.0*   Lipid Profile No results for input(s): CHOL, HDL, LDLCALC, TRIG, CHOLHDL, LDLDIRECT in the last 72 hours. Thyroid function studies Recent Labs    06/11/18 0610  TSH 2.789   Anemia work up Recent Labs    06/11/18 0610  VITAMINB12 352  FOLATE 19.8  FERRITIN 383*  TIBC 201*  IRON 15*  RETICCTPCT 1.1   Urinalysis    Component Value Date/Time   COLORURINE YELLOW 06/10/2018 1450   APPEARANCEUR CLEAR 06/10/2018 1450   LABSPEC 1.010 06/10/2018 1450   PHURINE 6.0 06/10/2018 1450   GLUCOSEU NEGATIVE 06/10/2018 1450   HGBUR NEGATIVE 06/10/2018 1450   BILIRUBINUR NEGATIVE 06/10/2018 1450   KETONESUR NEGATIVE 06/10/2018 1450   PROTEINUR NEGATIVE 06/10/2018 1450   NITRITE NEGATIVE 06/10/2018 1450   LEUKOCYTESUR SMALL (A) 06/10/2018 1450   Sepsis Labs Invalid input(s): PROCALCITONIN,  WBC,  LACTICIDVEN Microbiology Recent Results (from the past 240 hour(s))  Urine culture     Status: Abnormal   Collection Time: 06/10/18  2:50 PM  Result Value Ref Range Status   Specimen Description   Final    URINE, RANDOM Performed at [facility], [address] [place], [place] [address]    Special Requests   Final    NONE Performed at [facility], [address] [place], [place] [address]    Culture MULTIPLE SPECIES PRESENT, SUGGEST RECOLLECTION (A)  Final   Report Status 06/11/2018 FINAL  Final     Time coordinating discharge: Over 30 minutes  SIGNED:    J [place] DO  [facility] 06/12/2018, 11:37 AM

## 2018-06-12 NOTE — Progress Notes (Signed)
At 0415 c/o shortness of breath bilateral lobe congestion with rhonchi  O2 sat 94 on room air pulse 120 b/p 190/94  On call notified,  IVf decreased to 75/h, lopressor 5mg  iv push.  refused breathing treatment placed on 2 liters 02.  Chest xray revealed infiltrates on call notified resting  Quietly at present

## 2018-06-18 ENCOUNTER — Observation Stay (HOSPITAL_COMMUNITY): Payer: Medicare Other

## 2018-06-18 ENCOUNTER — Inpatient Hospital Stay (HOSPITAL_COMMUNITY)
Admission: EM | Admit: 2018-06-18 | Discharge: 2018-06-21 | DRG: 643 | Disposition: A | Discharge status: Home / Self Care | Payer: Medicare Other | Attending: Internal Medicine | Admitting: Internal Medicine

## 2018-06-18 ENCOUNTER — Encounter (HOSPITAL_COMMUNITY): Payer: Self-pay

## 2018-06-18 ENCOUNTER — Other Ambulatory Visit: Payer: Self-pay

## 2018-06-18 ENCOUNTER — Emergency Department (HOSPITAL_COMMUNITY): Payer: Medicare Other

## 2018-06-18 DIAGNOSIS — R600 Localized edema: Secondary | ICD-10-CM | POA: Diagnosis present

## 2018-06-18 DIAGNOSIS — G8929 Other chronic pain: Secondary | ICD-10-CM | POA: Diagnosis present

## 2018-06-18 DIAGNOSIS — M069 Rheumatoid arthritis, unspecified: Secondary | ICD-10-CM | POA: Diagnosis present

## 2018-06-18 DIAGNOSIS — E871 Hypo-osmolality and hyponatremia: Secondary | ICD-10-CM

## 2018-06-18 DIAGNOSIS — Z803 Family history of malignant neoplasm of breast: Secondary | ICD-10-CM

## 2018-06-18 DIAGNOSIS — Z882 Allergy status to sulfonamides status: Secondary | ICD-10-CM

## 2018-06-18 DIAGNOSIS — E222 Syndrome of inappropriate secretion of antidiuretic hormone: Secondary | ICD-10-CM | POA: Diagnosis not present

## 2018-06-18 DIAGNOSIS — C50912 Malignant neoplasm of unspecified site of left female breast: Secondary | ICD-10-CM | POA: Diagnosis present

## 2018-06-18 DIAGNOSIS — Z9012 Acquired absence of left breast and nipple: Secondary | ICD-10-CM

## 2018-06-18 DIAGNOSIS — E8770 Fluid overload, unspecified: Secondary | ICD-10-CM | POA: Diagnosis present

## 2018-06-18 DIAGNOSIS — R21 Rash and other nonspecific skin eruption: Secondary | ICD-10-CM | POA: Diagnosis present

## 2018-06-18 DIAGNOSIS — Z87891 Personal history of nicotine dependence: Secondary | ICD-10-CM

## 2018-06-18 DIAGNOSIS — Z6824 Body mass index (BMI) 24.0-24.9, adult: Secondary | ICD-10-CM

## 2018-06-18 DIAGNOSIS — E43 Unspecified severe protein-calorie malnutrition: Secondary | ICD-10-CM | POA: Diagnosis present

## 2018-06-18 DIAGNOSIS — J449 Chronic obstructive pulmonary disease, unspecified: Secondary | ICD-10-CM | POA: Diagnosis present

## 2018-06-18 DIAGNOSIS — E876 Hypokalemia: Secondary | ICD-10-CM | POA: Diagnosis not present

## 2018-06-18 DIAGNOSIS — F17210 Nicotine dependence, cigarettes, uncomplicated: Secondary | ICD-10-CM | POA: Diagnosis present

## 2018-06-18 DIAGNOSIS — E875 Hyperkalemia: Secondary | ICD-10-CM | POA: Diagnosis present

## 2018-06-18 DIAGNOSIS — Z79899 Other long term (current) drug therapy: Secondary | ICD-10-CM

## 2018-06-18 DIAGNOSIS — Z853 Personal history of malignant neoplasm of breast: Secondary | ICD-10-CM

## 2018-06-18 DIAGNOSIS — T380X5A Adverse effect of glucocorticoids and synthetic analogues, initial encounter: Secondary | ICD-10-CM | POA: Diagnosis present

## 2018-06-18 DIAGNOSIS — D638 Anemia in other chronic diseases classified elsewhere: Secondary | ICD-10-CM | POA: Diagnosis present

## 2018-06-18 DIAGNOSIS — G2581 Restless legs syndrome: Secondary | ICD-10-CM | POA: Diagnosis present

## 2018-06-18 DIAGNOSIS — Z885 Allergy status to narcotic agent status: Secondary | ICD-10-CM

## 2018-06-18 DIAGNOSIS — D72829 Elevated white blood cell count, unspecified: Secondary | ICD-10-CM | POA: Diagnosis present

## 2018-06-18 DIAGNOSIS — Z887 Allergy status to serum and vaccine status: Secondary | ICD-10-CM

## 2018-06-18 DIAGNOSIS — Z7982 Long term (current) use of aspirin: Secondary | ICD-10-CM

## 2018-06-18 LAB — URINALYSIS, ROUTINE W REFLEX MICROSCOPIC
Bacteria, UA: NONE SEEN
Bilirubin Urine: NEGATIVE
Glucose, UA: NEGATIVE mg/dL
Hgb urine dipstick: NEGATIVE
Ketones, ur: NEGATIVE mg/dL
Nitrite: NEGATIVE
Protein, ur: NEGATIVE mg/dL
Specific Gravity, Urine: 1.034 — ABNORMAL HIGH (ref 1.005–1.030)
pH: 6 (ref 5.0–8.0)

## 2018-06-18 LAB — COMPREHENSIVE METABOLIC PANEL WITH GFR
ALT: 49 U/L — ABNORMAL HIGH (ref 0–44)
AST: 34 U/L (ref 15–41)
Albumin: 2.3 g/dL — ABNORMAL LOW (ref 3.5–5.0)
Alkaline Phosphatase: 85 U/L (ref 38–126)
Anion gap: 8 (ref 5–15)
BUN: 14 mg/dL (ref 8–23)
CO2: 27 mmol/L (ref 22–32)
Calcium: 8 mg/dL — ABNORMAL LOW (ref 8.9–10.3)
Chloride: 91 mmol/L — ABNORMAL LOW (ref 98–111)
Creatinine, Ser: 0.65 mg/dL (ref 0.44–1.00)
GFR calc Af Amer: >60 mL/min
GFR calc non Af Amer: >60 mL/min
Glucose, Bld: 165 mg/dL — ABNORMAL HIGH (ref 70–99)
Potassium: 4.4 mmol/L (ref 3.5–5.1)
Sodium: 126 mmol/L — ABNORMAL LOW (ref 135–145)
Total Bilirubin: 0.6 mg/dL (ref 0.3–1.2)
Total Protein: 5.6 g/dL — ABNORMAL LOW (ref 6.5–8.1)

## 2018-06-18 LAB — TROPONIN I: Troponin I: 0.03 ng/mL

## 2018-06-18 LAB — CBC
HCT: 28.2 % — ABNORMAL LOW (ref 36.0–46.0)
Hemoglobin: 9.4 g/dL — ABNORMAL LOW (ref 12.0–15.0)
MCH: 30.9 pg (ref 26.0–34.0)
MCHC: 33.3 g/dL (ref 30.0–36.0)
MCV: 92.8 fL (ref 80.0–100.0)
Platelets: 613 10*3/uL — ABNORMAL HIGH (ref 150–400)
RBC: 3.04 MIL/uL — ABNORMAL LOW (ref 3.87–5.11)
RDW: 13.7 % (ref 11.5–15.5)
WBC: 18.1 10*3/uL — ABNORMAL HIGH (ref 4.0–10.5)
nRBC: 0 % (ref 0.0–0.2)

## 2018-06-18 LAB — HEPATIC FUNCTION PANEL
ALT: 43 U/L (ref 0–44)
AST: 25 U/L (ref 15–41)
Albumin: 2.3 g/dL — ABNORMAL LOW (ref 3.5–5.0)
Alkaline Phosphatase: 78 U/L (ref 38–126)
Bilirubin, Direct: <0.1 mg/dL (ref 0.0–0.2)
Total Bilirubin: 0.6 mg/dL (ref 0.3–1.2)
Total Protein: 5.4 g/dL — ABNORMAL LOW (ref 6.5–8.1)

## 2018-06-18 LAB — SODIUM, URINE, RANDOM: Sodium, Ur: 40 mmol/L

## 2018-06-18 LAB — SEDIMENTATION RATE: Sed Rate: 85 mm/hr — ABNORMAL HIGH (ref 0–22)

## 2018-06-18 LAB — OSMOLALITY, URINE: Osmolality, Ur: 423 mOsm/kg (ref 300–900)

## 2018-06-18 LAB — MAGNESIUM
Magnesium: 2.1 mg/dL (ref 1.7–2.4)
Magnesium: 2 mg/dL (ref 1.7–2.4)

## 2018-06-18 LAB — CREATININE, SERUM
Creatinine, Ser: 0.6 mg/dL (ref 0.44–1.00)
GFR calc Af Amer: >60 mL/min (ref >60)
GFR calc non Af Amer: >60 mL/min (ref >60)

## 2018-06-18 LAB — CORTISOL: Cortisol, Plasma: 20.8 ug/dL

## 2018-06-18 LAB — TSH: TSH: 2.873 u[IU]/mL (ref 0.350–4.500)

## 2018-06-18 LAB — OSMOLALITY: Osmolality: 275 mOsm/kg (ref 275–295)

## 2018-06-18 LAB — PHOSPHORUS: Phosphorus: 3 mg/dL (ref 2.5–4.6)

## 2018-06-18 LAB — C-REACTIVE PROTEIN: CRP: 17.6 mg/dL — ABNORMAL HIGH

## 2018-06-18 LAB — GLUCOSE, CAPILLARY: Glucose-Capillary: 127 mg/dL — ABNORMAL HIGH (ref 70–99)

## 2018-06-18 MED ORDER — ALBUMIN HUMAN 25 % IV SOLN
25.0000 g | Freq: Four times a day (QID) | INTRAVENOUS | Status: AC
  Administered 2018-06-18 – 2018-06-20 (×8): 25 g via INTRAVENOUS
  Filled 2018-06-18 (×10): qty 100

## 2018-06-18 MED ORDER — INSULIN ASPART 100 UNIT/ML ~~LOC~~ SOLN: 0.0000 [IU] | Freq: Every day | SUBCUTANEOUS | Status: DC

## 2018-06-18 MED ORDER — IPRATROPIUM-ALBUTEROL 0.5-2.5 (3) MG/3ML IN SOLN: 3.0000 mL | Freq: Four times a day (QID) | RESPIRATORY_TRACT | Status: DC | PRN

## 2018-06-18 MED ORDER — ASPIRIN EC 81 MG PO TBEC
81.0000 mg | DELAYED_RELEASE_TABLET | Freq: Every day | ORAL | Status: DC
  Administered 2018-06-18 – 2018-06-21 (×4): 81 mg via ORAL
  Filled 2018-06-18 (×4): qty 1

## 2018-06-18 MED ORDER — ENOXAPARIN SODIUM 40 MG/0.4ML ~~LOC~~ SOLN
40.0000 mg | SUBCUTANEOUS | Status: DC
  Filled 2018-06-18 (×2): qty 0.4

## 2018-06-18 MED ORDER — ENSURE ENLIVE PO LIQD
237.0000 mL | Freq: Two times a day (BID) | ORAL | Status: DC
  Administered 2018-06-19: 15:00:00 237 mL via ORAL

## 2018-06-18 MED ORDER — SODIUM CHLORIDE 0.9 % IV SOLN
INTRAVENOUS | Status: DC
  Administered 2018-06-18: 18:00:00 via INTRAVENOUS

## 2018-06-18 MED ORDER — INSULIN ASPART 100 UNIT/ML ~~LOC~~ SOLN
0.0000 [IU] | Freq: Three times a day (TID) | SUBCUTANEOUS | Status: DC
  Administered 2018-06-19: 12:00:00 3 [IU] via SUBCUTANEOUS
  Administered 2018-06-20 (×2): 2 [IU] via SUBCUTANEOUS
  Administered 2018-06-20: 12:00:00 3 [IU] via SUBCUTANEOUS

## 2018-06-18 MED ORDER — B COMPLEX-C PO TABS
1.0000 | ORAL_TABLET | Freq: Every day | ORAL | Status: DC
  Administered 2018-06-19 – 2018-06-21 (×3): 1 via ORAL
  Filled 2018-06-18 (×4): qty 1

## 2018-06-18 MED ORDER — FUROSEMIDE 10 MG/ML IJ SOLN
20.0000 mg | Freq: Two times a day (BID) | INTRAMUSCULAR | Status: DC
  Administered 2018-06-19: 09:00:00 20 mg via INTRAVENOUS
  Filled 2018-06-18: qty 2

## 2018-06-18 MED ORDER — IOHEXOL 300 MG/ML  SOLN
75.0000 mL | Freq: Once | INTRAMUSCULAR | Status: AC | PRN
  Administered 2018-06-18: 75 mL via INTRAVENOUS

## 2018-06-18 MED ORDER — VITAMIN D (ERGOCALCIFEROL) 1.25 MG (50000 UNIT) PO CAPS
50000.0000 [IU] | ORAL_CAPSULE | ORAL | Status: DC
  Filled 2018-06-18: qty 1

## 2018-06-18 NOTE — ED Triage Notes (Signed)
She states she has had recent issues with "TMJ--I'm not eating right". She was phoned by her pcp today and told she had abnormal labs and to come to the hospital. She states she was told her "sodium is low and my potassium is high". She is ambulatory and in no distress. She denies fever cough, nor any other sign of current illness.

## 2018-06-18 NOTE — ED Provider Notes (Signed)
[facility] DEPT Provider Note   CSN: [id number] Arrival date & time: 06/18/18  1522    History   Chief Complaint Chief Complaint  Patient presents with   Abnormal Lab    HPI Linda Ray is a 84 y.o. female.     Patient is an 84 year old female with a history of arthritis, breast cancer, chronic back pain who was recently admitted to the hospital on 06/10/2018 after being found to be hyponatremic, hypocalcemic with generalized rash.  During her hospitalization patient was found to have significantly elevated ESR and CRP and elevated white blood cell count.  She was treated with antibiotics but denied any respiratory symptoms or urinary symptoms.  Patient was initially hydrated and then diuresed because thought to have potentially been over hydrated.  Patient states also within the last month she has been struggling with TMJ where she has pain in bilateral jaws that radiates up into her temples and across the front of her head.  Prior to that she had never had that before.  She is still having pain when she opens her mouth or attempts to chew but denies any visual changes.  She went to her doctor on Monday because she was still not feeling well and started noticing swelling in her legs.  She was given Lasix which she took 1 dose of but return to the doctor today because the swelling had not changed.  Today they checked lab work and noted her potassium to be 6.2, sodium of 125 and leukocytosis of 18,000 and sent her here for further care.  Today patient denies any weight changes, new shortness of breath or change in cough.  She does have chronic cough and shortness of breath from COPD and ongoing tobacco abuse since the age of 65.  She has no abdominal pain, nausea or vomiting but states she has been eating very little.  The history is provided by the patient and medical records.  Abnormal Lab  Time since result:  Sodium and potassium Patient referred by:   PCP Resulting agency:  External Resulting agency details:  [facility] Result type: chemistry   Chemistry:    Sodium:  Low   Potassium:  High   Calcium:  Low   Past Medical History:  Diagnosis Date   Arthritis    Borderline diabetes    Breast cancer ([place])    Chronic back pain    RLS (restless legs syndrome)     Patient Active Problem List   Diagnosis Date Noted   Pneumonia 06/12/2018   Hyponatremia 06/10/2018   Rash 06/10/2018   Myalgia 06/10/2018   Sinus tachycardia 06/10/2018   UTI (urinary tract infection) 06/10/2018   COPD, moderate ([place]) 07/27/2015   Smoking history 05/31/2015   Incidental lung nodule, > 61m and < 873m04/05/2015   Other fatigue 05/31/2015   Breast cancer, left breast - lobular T2N0 12/05/2010    Past Surgical History:  Procedure Laterality Date   ABDOMINAL SURGERY  1954   BACK SURGERY  2009   BREAST SURGERY     MASTECTOMY Left 2007   MASTECTOMY     TONSILLECTOMY       OB History   No obstetric history on file.      Home Medications    Prior to Admission medications   Medication Sig Start Date End Date Taking? Authorizing Provider  albuterol (PROVENTIL HFA;VENTOLIN HFA) 108 (90 Base) MCG/ACT inhaler Inhale 1-2 puffs into the lungs every 6 (six) hours as needed  for wheezing or shortness of breath. 06/12/18   [place] Donnamarie Poag, DO  aspirin 81 MG tablet Take 81 mg by mouth daily.      [provider]  B Complex-C (B-COMPLEX WITH VITAMIN C) tablet Take 1 tablet by mouth daily.    [provider]  umeclidinium bromide (INCRUSE ELLIPTA) 62.5 MCG/INH AEPB Inhale 1 puff into the lungs daily. 06/13/18   [place] Eric J, DO    Family History Family History  Problem Relation Age of Onset   Cancer Mother        colon   Stroke Father    Cancer Sister        breast, bladder, kidney   Breast cancer Sister     Social History Social History   Tobacco Use   Smoking status: Current Every Day Smoker     Packs/day: 1.25    Years: 65.00    Pack years: 81.25    Types: Cigarettes   Smokeless tobacco: Never Used   Tobacco comment: currently smoking 1ppd 07/30/2016  Substance Use Topics   Alcohol use: Yes    Alcohol/week: 2.0 standard drinks    Types: 2 Standard drinks or equivalent per week    Comment: 2 coctails per day   Drug use: No     Allergies   Codeine; Sulfa drugs cross reactors; and Tetanus-diphth-acell pertussis   Review of Systems Review of Systems  All other systems reviewed and are negative.    Physical Exam Updated Vital Signs BP (!) 141/61 (BP Location: Left Arm)    Pulse (!) 109    Temp 98.4 F (36.9 C) (Oral)    Resp 14    Ht '5\' 2"'$  (1.575 m)    Wt 60.8 kg    SpO2 98%    BMI 24.51 kg/m   Physical Exam Vitals signs and nursing note reviewed.  Constitutional:      General: She is not in acute distress.    Appearance: She is well-developed.  HENT:     Head: Normocephalic and atraumatic.     Comments: Tenderness over bilateral temples and proximal mandible Eyes:     Pupils: Pupils are equal, round, and reactive to light.  Cardiovascular:     Rate and Rhythm: Normal rate and regular rhythm.     Heart sounds: Normal heart sounds. No murmur. No friction rub.  Pulmonary:     Effort: Pulmonary effort is normal.     Breath sounds: Normal breath sounds. No wheezing or rales.  Abdominal:     General: Bowel sounds are normal. There is no distension.     Palpations: Abdomen is soft.     Tenderness: There is no abdominal tenderness. There is no guarding or rebound.  Musculoskeletal: Normal range of motion.        General: No tenderness.     Right lower leg: Edema present.     Left lower leg: Edema present.     Comments: No edema  Skin:    General: Skin is warm and dry.     Findings: No rash.  Neurological:     Mental Status: She is alert and oriented to person, place, and time.     Cranial Nerves: No cranial nerve deficit.  Psychiatric:        Behavior:  Behavior normal.      ED Treatments / Results  Labs (all labs ordered are listed, but only abnormal results are displayed) Labs Reviewed  COMPREHENSIVE METABOLIC PANEL - Abnormal; Notable for  the following components:      Result Value   Sodium 126 (*)    Chloride 91 (*)    Glucose, Bld 165 (*)    Calcium 8.0 (*)    Total Protein 5.6 (*)    Albumin 2.3 (*)    ALT 49 (*)    All other components within normal limits  SEDIMENTATION RATE - Abnormal; Notable for the following components:   Sed Rate 85 (*)    All other components within normal limits  C-REACTIVE PROTEIN - Abnormal; Notable for the following components:   CRP 17.6 (*)    All other components within normal limits  TROPONIN I - Abnormal; Notable for the following components:   Troponin I 0.03 (*)    All other components within normal limits  MAGNESIUM    EKG EKG Interpretation  Date/Time:  Wednesday June 18 2018 16:11:09 EDT Ventricular Rate:  96 PR Interval:    QRS Duration: 81 QT Interval:  357 QTC Calculation: 452 R Axis:   6 Text Interpretation:  Sinus tachycardia Multiple ventricular premature complexes Low voltage, extremity leads Probable anteroseptal infarct, old No significant change since last tracing Confirmed by Blanchie Dessert [phone]) on 06/18/2018 4:28:48 PM   Radiology Ct Head Wo Contrast  Result Date: 06/18/2018 CLINICAL DATA:  Headache EXAM: CT HEAD WITHOUT CONTRAST TECHNIQUE: Contiguous axial images were obtained from the base of the skull through the vertex without intravenous contrast. COMPARISON:  None. FINDINGS: Brain: No acute territorial infarction, hemorrhage or intracranial mass. Patchy hypodensity in the bilateral white matter. Mild atrophy. Prominent ventricles, felt secondary to atrophy. Vascular: No hyperdense vessels. Vertebral and carotid vascular calcification Skull: Normal. Negative for fracture or focal lesion. Sinuses/Orbits: No acute finding. Other: None IMPRESSION: 1. No  CT evidence for acute intracranial abnormality. 2. Atrophy and mild small vessel ischemic changes of the white matter. Electronically Signed   By: Donavan Foil M.D.   On: 06/18/2018 17:08    Procedures Procedures (including critical care time)  Medications Ordered in ED Medications - No data to display   Initial Impression / Assessment and Plan / ED Course  I have reviewed the triage vital signs and the nursing notes.  Pertinent labs & imaging results that were available during my care of the patient were reviewed by me and considered in my medical decision making (see chart for details).       Patient presenting today from her PCP office for worsening labs.  Patient was recently hospitalized just 5 to 6 days ago for similar symptoms.  This seems to be ongoing progression of what was going on earlier.  Patient states of the left lung she has been struggling with TMJ which is brand-new for her.  She also complains of tenderness over her temporal arteries but it is bilateral.  She has no vision changes.  During his recent hospitalization sed rate and CRP were significantly elevated as well as her white blood cell count.  She had concern for possible UTI but culture came back negative.  She was sent home with antibiotics but she denies any fever or respiratory changes.  Low suspicion for COVID.  She has no abdominal pain on exam and breath sounds are clear.  Patient is satting 98% on room air and is in no acute respiratory distress.  She is mildly tachycardic which is similar to her last hospitalization.  Patient does have edema in bilateral lower extremities that is nonpitting but no erythema or rash present at this  time.  Patient did have an elevated rheumatoid marker during her hospitalization but other more specific markers came back negative.  Patient refused prednisone prior to discharge due to the issues she has with her blood sugar.  She did take a dose of Lasix but it did not help with the  lower extremity swelling and she is again hyponatremic and this time more hyper Kaley make.  KG with no T waves changes.  Patient's creatinine is within normal limits.  Concern for possible endocrine abnormality versus some type of vasculitis.  Also patient has a prior history of breast cancer and will do a CT of the head to ensure no lesions.  Her thyroid test are within normal limits during her last visit and a.m. cortisol level was within normal limits.  Will recheck chemistry panel, sed rate and CRP, troponin, magnesium, EKG.  5:45 PM EKG without acute changes, CMP with hyponatremia of 126 but normal potassium of 4.4.  BUN and creatinine are normal.  Sed rate is 85 and CRP is 17 so not significantly different from prior.  Troponin borderline at 0.03.  Magnesium within normal limits and calcium decreased again to 8.  Head CT was negative for anything acute.  Will discuss with hospitalist  Final Clinical Impressions(s) / ED Diagnoses   Final diagnoses:  Hyponatremia    ED Discharge Orders    None       Blanchie Dessert, MD 06/18/18 1800

## 2018-06-18 NOTE — ED Notes (Signed)
Pt returned from CT °

## 2018-06-18 NOTE — ED Notes (Signed)
Pt aware urine sample needed but unable to provide at this time.

## 2018-06-18 NOTE — ED Notes (Signed)
ED TO INPATIENT HANDOFF REPORT  ED Nurse Name and Phone #:  Duard Larsen  [phone]   Name/Age/Gender Linda Ray 84 y.o. female Room/Bed: WA05/WA05  Code Status   Code Status: Prior  Home/SNF/Other Home Patient oriented to: self, place, time and situation Is this baseline? Yes   Triage Complete: Triage complete  Chief Complaint Abnormal labs, sent by dr  Triage Note She states she has had recent issues with "TMJ--I'm not eating right". She was phoned by her pcp today and told she had abnormal labs and to come to the hospital. She states she was told her "sodium is low and my potassium is high". She is ambulatory and in no distress. She denies fever cough, nor any other sign of current illness.   Allergies Allergies  Allergen Reactions  . Codeine   . Sulfa Drugs Cross Reactors   . Tetanus-Diphth-Acell Pertussis Other (See Comments)    Significant local reaction    Level of Care/Admitting Diagnosis ED Disposition    ED Disposition Condition Comment   Admit  Hospital Area: [facility] [[id number]]  Level of Care: Med-Surg [16]  Covid Evaluation: N/A  Diagnosis: Hyponatremia [269485]  Admitting Physician: Bonnell Public [[id number]]  Attending Physician: Dana Allan I [[id number]]  PT Class (Do Not Modify): Observation [104]  PT Acc Code (Do Not Modify): Observation [10022]       B Medical/Surgery History Past Medical History:  Diagnosis Date  . Arthritis   . Borderline diabetes   . Breast cancer ([place])   . Chronic back pain   . RLS (restless legs syndrome)    Past Surgical History:  Procedure Laterality Date  . ABDOMINAL SURGERY  1954  . BACK SURGERY  2009  . BREAST SURGERY    . MASTECTOMY Left 2007  . MASTECTOMY    . TONSILLECTOMY       A IV Location/Drains/Wounds Patient Lines/Drains/Airways Status   Active Line/Drains/Airways    Name:   Placement date:   Placement time:   Site:   Days:   Peripheral IV 06/18/18 Right  Antecubital   06/18/18    1620    Antecubital   less than 1          Intake/Output Last 24 hours No intake or output data in the 24 hours ending 06/18/18 1928  Labs/Imaging Results for orders placed or performed during the hospital encounter of 06/18/18 (from the past 48 hour(s))  C-reactive protein     Status: Abnormal   Collection Time: 06/18/18  4:15 PM  Result Value Ref Range   CRP 17.6 (H) <1.0 mg/dL    Comment: Performed at [facility], [place] [address], [place], [place] [address]  Comprehensive metabolic panel     Status: Abnormal   Collection Time: 06/18/18  4:27 PM  Result Value Ref Range   Sodium 126 (L) 135 - 145 mmol/L   Potassium 4.4 3.5 - 5.1 mmol/L   Chloride 91 (L) 98 - 111 mmol/L   CO2 27 22 - 32 mmol/L   Glucose, Bld 165 (H) 70 - 99 mg/dL   BUN 14 8 - 23 mg/dL   Creatinine, Ser 0.65 0.44 - 1.00 mg/dL   Calcium 8.0 (L) 8.9 - 10.3 mg/dL   Total Protein 5.6 (L) 6.5 - 8.1 g/dL   Albumin 2.3 (L) 3.5 - 5.0 g/dL   AST 34 15 - 41 U/L   ALT 49 (H) 0 - 44 U/L   Alkaline Phosphatase 85  38 - 126 U/L   Total Bilirubin 0.6 0.3 - 1.2 mg/dL   GFR calc non Af Amer >60 >60 mL/min   GFR calc Af Amer >60 >60 mL/min   Anion gap 8 5 - 15    Comment: Performed at [facility], [place] [address] [place], [place] [address]  Sedimentation rate     Status: Abnormal   Collection Time: 06/18/18  4:27 PM  Result Value Ref Range   Sed Rate 85 (H) 0 - 22 mm/hr    Comment: Performed at [facility], [place] [address] [place], [place] [address]  Troponin I - ONCE - STAT     Status: Abnormal   Collection Time: 06/18/18  4:27 PM  Result Value Ref Range   Troponin I 0.03 (HH) <0.03 ng/mL    Comment: CRITICAL RESULT CALLED TO, READ BACK BY AND VERIFIED WITH: T.SMITH AT 1740 ON 06/18/18 BY N.THOMPSON Performed at [facility], [place] [address], [place], [place] [address]   Magnesium     Status: None   Collection Time:  06/18/18  4:27 PM  Result Value Ref Range   Magnesium 2.1 1.7 - 2.4 mg/dL    Comment: Performed at [facility], [place] [address], [place], [place] [address]  TSH     Status: None   Collection Time: 06/18/18  6:20 PM  Result Value Ref Range   TSH 2.873 0.350 - 4.500 uIU/mL    Comment: Performed by a 3rd Generation assay with a functional sensitivity of <=0.01 uIU/mL. Performed at [facility], [place] [address], [place], [place] [address]    Ct Head Wo Contrast  Result Date: 06/18/2018 CLINICAL DATA:  Headache EXAM: CT HEAD WITHOUT CONTRAST TECHNIQUE: Contiguous axial images were obtained from the base of the skull through the vertex without intravenous contrast. COMPARISON:  None. FINDINGS: Brain: No acute territorial infarction, hemorrhage or intracranial mass. Patchy hypodensity in the bilateral white matter. Mild atrophy. Prominent ventricles, felt secondary to atrophy. Vascular: No hyperdense vessels. Vertebral and carotid vascular calcification Skull: Normal. Negative for fracture or focal lesion. Sinuses/Orbits: No acute finding. Other: None IMPRESSION: 1. No CT evidence for acute intracranial abnormality. 2. Atrophy and mild small vessel ischemic changes of the white matter. Electronically Signed   By: Donavan Foil M.D.   On: 06/18/2018 17:08    Pending Labs Unresulted Labs (From admission, onward)    Start     Ordered   06/18/18 1820  Sodium, urine, random  Once,   R     06/18/18 1819   06/18/18 1820  Osmolality  Once,   R     06/18/18 1819   06/18/18 1820  Osmolality, urine  Once,   R     06/18/18 1819   06/18/18 1820  Cortisol  Once,   R     06/18/18 1819   06/18/18 1820  Urinalysis, Routine w reflex microscopic  Once,   R     06/18/18 1819          Vitals/Pain Today's Vitals   06/18/18 1534 06/18/18 1537 06/18/18 1830  BP:  (!) 141/61 (!) 115/37  Pulse:  (!) 109 98  Resp:  14 (!) 22  Temp:  98.4 F (36.9 C)   TempSrc:  Oral    SpO2:  98% 100%  Weight:  60.8 kg   Height:  5\' 2"  (1.575 m)   PainSc: 2       Isolation Precautions No  active isolations  Medications Medications  0.9 %  sodium chloride infusion ( Intravenous New Bag/Given 06/18/18 1819)    Mobility walks Low fall risk   Focused Assessments Neuro Assessment Handoff:  Swallow screen pass? Yes  Cardiac Rhythm: Normal sinus rhythm       Neuro Assessment: Exceptions to WDL Neuro Checks:      Last Documented NIHSS Modified Score:   Has TPA been given? No If patient is a Neuro Trauma and patient is going to OR before floor call report to [facility] nurse: [phone] or [phone]     R Recommendations: See Admitting Provider Note  Report given to:   Additional Notes: N/A

## 2018-06-18 NOTE — H&P (Signed)
History and Physical  YAMINA LENIS RWE:[id number] DOB: [birth year] DOA: 06/18/2018  Referring physician: ER physician PCP: Vicenta Aly, [place]  Outpatient Specialists:    Patient coming from: PCPs office  Chief Complaint: Hyponatremia.  Potassium of 6.2 was also noted at lab work done at the PCPs office.   HPI: Patient is an 84 year old Caucasian female past medical history significant for breast cancer, arthritis, borderline diabetes mellitus, restless leg syndrome and chronic back pain.  Patient also reports history of cigarette use since the age of 62.  According to patient, she continues to smoke about 1 pack of cigarettes daily.  Was said to have been diagnosed with bilateral TMJ about 2 weeks ago and was treated with anti-inflammatory.  Patient was admitted to the hospital on 06/10/2018 with soreness and rash involving bilateral legs.  During the admission, patient was found to have hyponatremia.  Patient was extensively worked up, however, urine osmolality was not visualized.  Patient was also noted to have elevated WBC, very high sed rate, elevated ESR, elevated rheumatoid arthritis latex turbid and myeloperoxidase antibody.  Patient is not known to a rheumatologist.  Patient was discharged on 06/12/2018, and was advised to follow-up with the primary care provider within a week for repeat chemistry.  Patient was seen by the primary care provider's office today, had lab work done that revealed potassium of 6.2 and sodium of 125.  Repeat chemistry done at the ER revealed normal potassium, 4.4, but sodium remains low at 126.  Patient gives history of poor p.o. intake due to TMJ, but does not look volume depleted.  Actually, patient has peripheral edema.  Essentially, chemistry done on presentation revealed sodium of 126, BUN of 14, serum creatinine of 0.65, albumin of 2.3, AST of 34, ALT of 49 total protein of 5.6.  P was 17.6.  Sed rate today is 8 5.  The revealed WBC of 18.1, hemoglobin of 9.4,  hematocrit 28.2, platelet count of 613.  CT head did not show any acute findings.  Patient be admitted for further assessment and management of the hyponatremia.  ED Course: Repeat chemistry done at the ER revealed normal potassium.  Sodium remains low.  His CTA was nonrevealing.  Hospitalist team was called to admit patient.  Pertinent labs: As documented above.  EKG: Independently reviewed.  Low voltage at the extremity leads, with poor R wave progression.  Imaging: independently reviewed.   Review of Systems:  Negative for fever, visual changes, sore throat, chest pain, SOB, dysuria, bleeding, n/v/abdominal pain.  Past Medical History:  Diagnosis Date  . Arthritis   . Borderline diabetes   . Breast cancer ([place])   . Chronic back pain   . RLS (restless legs syndrome)     Past Surgical History:  Procedure Laterality Date  . ABDOMINAL SURGERY  1954  . BACK SURGERY  2009  . BREAST SURGERY    . MASTECTOMY Left 2007  . MASTECTOMY    . TONSILLECTOMY       reports that she has been smoking cigarettes. She has a 81.25 pack-year smoking history. She has never used smokeless tobacco. She reports current alcohol use of about 2.0 standard drinks of alcohol per week. She reports that she does not use drugs.  Allergies  Allergen Reactions  . Codeine   . Sulfa Drugs Cross Reactors   . Tetanus-Diphth-Acell Pertussis Other (See Comments)    Significant local reaction    Family History  Problem Relation Age of Onset  . Cancer Mother  colon  . Stroke Father   . Cancer Sister        breast, bladder, kidney  . Breast cancer Sister      Prior to Admission medications   Medication Sig Start Date End Date Taking? Authorizing Provider  aspirin 81 MG tablet Take 81 mg by mouth daily.     Yes [provider]  B Complex-C (B-COMPLEX WITH VITAMIN C) tablet Take 1 tablet by mouth daily.   Yes [provider]  ergocalciferol (VITAMIN D2) 1.25 MG (50000 UT) capsule  Take 50,000 Units by mouth once a week.  06/16/18 10/14/18 Yes [provider]  albuterol (PROVENTIL HFA;VENTOLIN HFA) 108 (90 Base) MCG/ACT inhaler Inhale 1-2 puffs into the lungs every 6 (six) hours as needed for wheezing or shortness of breath. 06/12/18   [place] Donnamarie Poag, DO  cyclobenzaprine (FLEXERIL) 10 MG tablet Take 10 mg by mouth at bedtime.  06/12/18   [provider]  umeclidinium bromide (INCRUSE ELLIPTA) 62.5 MCG/INH AEPB Inhale 1 puff into the lungs daily. 06/13/18   [place] Eric J, DO    Physical Exam: Vitals:   06/18/18 1537 06/18/18 1830  BP: (!) 141/61 (!) 115/37  Pulse: (!) 109 98  Resp: 14 (!) 22  Temp: 98.4 F (36.9 C)   TempSrc: Oral   SpO2: 98% 100%  Weight: 60.8 kg   Height: '5\' 2"'$  (1.575 m)    Constitutional:  . Appears calm and comfortable. Eyes:  Marland Kitchen Mild pallor. No jaundice.  ENMT:  . external ears, nose appear normal Neck:  . Neck is supple. No JVD Respiratory:  . CTA bilaterally, no w/r/r.  . Respiratory effort normal. No retractions or accessory muscle use Cardiovascular:  . S1S2 . 2-2+ bilateral LE extremity edema   Abdomen:  . Abdomen is soft and non tender. Organs are difficult to assess. Neurologic:  . Awake and alert. . Moves all limbs.  Wt Readings from Last 3 Encounters:  06/18/18 60.8 kg  06/10/18 62.1 kg  07/30/16 60.6 kg    I have personally reviewed following labs and imaging studies  Labs on Admission:  CBC: Recent Labs  Lab 06/12/18 0019  WBC 13.9*  HGB 10.0*  HCT 31.2*  MCV 94.5  PLT 948*   Basic Metabolic Panel: Recent Labs  Lab 06/12/18 0019 06/18/18 1627  NA 131* 126*  K 4.3 4.4  CL 101 91*  CO2 22 27  GLUCOSE 156* 165*  BUN 14 14  CREATININE 0.58 0.65  CALCIUM 8.0* 8.0*  MG  --  2.1   Liver Function Tests: Recent Labs  Lab 06/18/18 1627  AST 34  ALT 49*  ALKPHOS 85  BILITOT 0.6  PROT 5.6*  ALBUMIN 2.3*   No results for input(s): LIPASE, AMYLASE in the last 168 hours. No  results for input(s): AMMONIA in the last 168 hours. Coagulation Profile: No results for input(s): INR, PROTIME in the last 168 hours. Cardiac Enzymes: Recent Labs  Lab 06/18/18 1627  TROPONINI 0.03*   BNP (last 3 results) No results for input(s): PROBNP in the last 8760 hours. HbA1C: No results for input(s): HGBA1C in the last 72 hours. CBG: Recent Labs  Lab 06/11/18 2046 06/12/18 0732  GLUCAP 174* 128*   Lipid Profile: No results for input(s): CHOL, HDL, LDLCALC, TRIG, CHOLHDL, LDLDIRECT in the last 72 hours. Thyroid Function Tests: Recent Labs    06/18/18 1820  TSH 2.873   Anemia Panel: No results for input(s): VITAMINB12, FOLATE, FERRITIN, TIBC, IRON, RETICCTPCT  in the last 72 hours. Urine analysis:    Component Value Date/Time   COLORURINE YELLOW 06/10/2018 1450   APPEARANCEUR CLEAR 06/10/2018 1450   LABSPEC 1.010 06/10/2018 1450   PHURINE 6.0 06/10/2018 1450   GLUCOSEU NEGATIVE 06/10/2018 1450   HGBUR NEGATIVE 06/10/2018 1450   BILIRUBINUR NEGATIVE 06/10/2018 1450   KETONESUR NEGATIVE 06/10/2018 1450   PROTEINUR NEGATIVE 06/10/2018 1450   NITRITE NEGATIVE 06/10/2018 1450   LEUKOCYTESUR SMALL (A) 06/10/2018 1450   Sepsis Labs: '@LABRCNTIP'$ (procalcitonin:4,lacticidven:4) ) Recent Results (from the past 240 hour(s))  Urine culture     Status: Abnormal   Collection Time: 06/10/18  2:50 PM  Result Value Ref Range Status   Specimen Description   Final    URINE, RANDOM Performed at [facility], [address] [place], [place] [address]    Special Requests   Final    NONE Performed at [facility], [address] [place], [place] [address]    Culture MULTIPLE SPECIES PRESENT, SUGGEST RECOLLECTION (A)  Final   Report Status 06/11/2018 FINAL  Final      Radiological Exams on Admission: Ct Head Wo Contrast  Result Date: 06/18/2018 CLINICAL DATA:  Headache EXAM: CT HEAD WITHOUT CONTRAST TECHNIQUE: Contiguous axial images were  obtained from the base of the skull through the vertex without intravenous contrast. COMPARISON:  None. FINDINGS: Brain: No acute territorial infarction, hemorrhage or intracranial mass. Patchy hypodensity in the bilateral white matter. Mild atrophy. Prominent ventricles, felt secondary to atrophy. Vascular: No hyperdense vessels. Vertebral and carotid vascular calcification Skull: Normal. Negative for fracture or focal lesion. Sinuses/Orbits: No acute finding. Other: None IMPRESSION: 1. No CT evidence for acute intracranial abnormality. 2. Atrophy and mild small vessel ischemic changes of the white matter. Electronically Signed   By: Donavan Foil M.D.   On: 06/18/2018 17:08    EKG: Independently reviewed.   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 Check TSH Check cortisol level - Patient has a 70 pack years, will proceed with CT scan of the chest to rule out occult malignancy -Patient may also have yet undiagnosed vasculitic process -Patient has also continue to report poor p.o. intake, and albumin is 2.3. -Further management will depend on hospital course.  Hyperkalemia (as reported by patient's PCPs lab work): -Suspect secondary to this cell lysis -Repeat BMP at the hospital revealed potassium of 4.4.  -Possible undiagnosed vasculitic process: Will recommend following up with rheumatology on discharge. WBC is elevated, ESR is elevated, CRP is elevated.   Myeloperoxidase antibody and RA latex turbid are also elevated  Hypoalbuminemia: Possible secondary to moderate malnutrition Caloric count Dietitian consult  Rash lower extremities: Resolved. Query livedo reticularis/vasculitic rash See above.  DVT prophylaxis: Subacute Lovenox Code Status: Full Family Communication:  Disposition Plan: Home eventually Consults called: None.  Consider follow-up  with rheumatology on discharge Admission status: Observation  Time spent: 65 minutes  Dana Allan, MD  [facility] Pager #: [phone] 7PM-7AM contact night coverage as above  06/18/2018, 7:48 PM

## 2018-06-18 NOTE — ED Notes (Signed)
Pt transported to CT via stretcher.  

## 2018-06-18 NOTE — ED Notes (Signed)
Gave report to [facility], Therapist, sports for room [phone].

## 2018-06-19 ENCOUNTER — Encounter (HOSPITAL_COMMUNITY): Payer: Medicare Other

## 2018-06-19 ENCOUNTER — Observation Stay (HOSPITAL_COMMUNITY): Payer: Medicare Other

## 2018-06-19 ENCOUNTER — Inpatient Hospital Stay (HOSPITAL_COMMUNITY): Payer: Medicare Other

## 2018-06-19 DIAGNOSIS — Z853 Personal history of malignant neoplasm of breast: Secondary | ICD-10-CM | POA: Diagnosis not present

## 2018-06-19 DIAGNOSIS — Z887 Allergy status to serum and vaccine status: Secondary | ICD-10-CM | POA: Diagnosis not present

## 2018-06-19 DIAGNOSIS — G2581 Restless legs syndrome: Secondary | ICD-10-CM | POA: Diagnosis present

## 2018-06-19 DIAGNOSIS — J449 Chronic obstructive pulmonary disease, unspecified: Secondary | ICD-10-CM | POA: Diagnosis present

## 2018-06-19 DIAGNOSIS — I340 Nonrheumatic mitral (valve) insufficiency: Secondary | ICD-10-CM | POA: Diagnosis not present

## 2018-06-19 DIAGNOSIS — F17210 Nicotine dependence, cigarettes, uncomplicated: Secondary | ICD-10-CM | POA: Diagnosis present

## 2018-06-19 DIAGNOSIS — Z6824 Body mass index (BMI) 24.0-24.9, adult: Secondary | ICD-10-CM | POA: Diagnosis not present

## 2018-06-19 DIAGNOSIS — I361 Nonrheumatic tricuspid (valve) insufficiency: Secondary | ICD-10-CM | POA: Diagnosis not present

## 2018-06-19 DIAGNOSIS — I82409 Acute embolism and thrombosis of unspecified deep veins of unspecified lower extremity: Secondary | ICD-10-CM | POA: Diagnosis not present

## 2018-06-19 DIAGNOSIS — Z79899 Other long term (current) drug therapy: Secondary | ICD-10-CM | POA: Diagnosis not present

## 2018-06-19 DIAGNOSIS — Z87891 Personal history of nicotine dependence: Secondary | ICD-10-CM

## 2018-06-19 DIAGNOSIS — E875 Hyperkalemia: Secondary | ICD-10-CM | POA: Diagnosis present

## 2018-06-19 DIAGNOSIS — Z882 Allergy status to sulfonamides status: Secondary | ICD-10-CM | POA: Diagnosis not present

## 2018-06-19 DIAGNOSIS — T380X5A Adverse effect of glucocorticoids and synthetic analogues, initial encounter: Secondary | ICD-10-CM | POA: Diagnosis present

## 2018-06-19 DIAGNOSIS — M069 Rheumatoid arthritis, unspecified: Secondary | ICD-10-CM | POA: Diagnosis present

## 2018-06-19 DIAGNOSIS — E222 Syndrome of inappropriate secretion of antidiuretic hormone: Secondary | ICD-10-CM | POA: Diagnosis present

## 2018-06-19 DIAGNOSIS — Z7982 Long term (current) use of aspirin: Secondary | ICD-10-CM | POA: Diagnosis not present

## 2018-06-19 DIAGNOSIS — E871 Hypo-osmolality and hyponatremia: Secondary | ICD-10-CM | POA: Diagnosis present

## 2018-06-19 DIAGNOSIS — Z9012 Acquired absence of left breast and nipple: Secondary | ICD-10-CM | POA: Diagnosis not present

## 2018-06-19 DIAGNOSIS — R600 Localized edema: Secondary | ICD-10-CM | POA: Diagnosis present

## 2018-06-19 DIAGNOSIS — E43 Unspecified severe protein-calorie malnutrition: Secondary | ICD-10-CM | POA: Diagnosis present

## 2018-06-19 DIAGNOSIS — E8770 Fluid overload, unspecified: Secondary | ICD-10-CM | POA: Diagnosis present

## 2018-06-19 DIAGNOSIS — E876 Hypokalemia: Secondary | ICD-10-CM | POA: Diagnosis not present

## 2018-06-19 DIAGNOSIS — R21 Rash and other nonspecific skin eruption: Secondary | ICD-10-CM | POA: Diagnosis present

## 2018-06-19 DIAGNOSIS — D72829 Elevated white blood cell count, unspecified: Secondary | ICD-10-CM | POA: Diagnosis present

## 2018-06-19 DIAGNOSIS — Z885 Allergy status to narcotic agent status: Secondary | ICD-10-CM | POA: Diagnosis not present

## 2018-06-19 DIAGNOSIS — G8929 Other chronic pain: Secondary | ICD-10-CM | POA: Diagnosis present

## 2018-06-19 DIAGNOSIS — D638 Anemia in other chronic diseases classified elsewhere: Secondary | ICD-10-CM | POA: Diagnosis present

## 2018-06-19 DIAGNOSIS — Z803 Family history of malignant neoplasm of breast: Secondary | ICD-10-CM | POA: Diagnosis not present

## 2018-06-19 LAB — CBC
HCT: 27.1 % — ABNORMAL LOW (ref 36.0–46.0)
Hemoglobin: 8.8 g/dL — ABNORMAL LOW (ref 12.0–15.0)
MCH: 30.2 pg (ref 26.0–34.0)
MCHC: 32.5 g/dL (ref 30.0–36.0)
MCV: 93.1 fL (ref 80.0–100.0)
Platelets: 605 10*3/uL — ABNORMAL HIGH (ref 150–400)
RBC: 2.91 MIL/uL — ABNORMAL LOW (ref 3.87–5.11)
RDW: 13.5 % (ref 11.5–15.5)
WBC: 15.4 10*3/uL — ABNORMAL HIGH (ref 4.0–10.5)
nRBC: 0 % (ref 0.0–0.2)

## 2018-06-19 LAB — GLUCOSE, CAPILLARY
Glucose-Capillary: 130 mg/dL — ABNORMAL HIGH (ref 70–99)
Glucose-Capillary: 226 mg/dL — ABNORMAL HIGH (ref 70–99)
Glucose-Capillary: 137 mg/dL — ABNORMAL HIGH (ref 70–99)
Glucose-Capillary: 167 mg/dL — ABNORMAL HIGH (ref 70–99)

## 2018-06-19 LAB — VITAMIN B12: Vitamin B-12: 320 pg/mL (ref 180–914)

## 2018-06-19 LAB — ECHOCARDIOGRAM COMPLETE
Height: 62 in
Weight: 2134.05 oz

## 2018-06-19 LAB — IRON AND TIBC
Iron: 10 ug/dL — ABNORMAL LOW (ref 28–170)
Saturation Ratios: 6 % — ABNORMAL LOW (ref 10.4–31.8)
TIBC: 177 ug/dL — ABNORMAL LOW (ref 250–450)
UIBC: 167 ug/dL

## 2018-06-19 LAB — BASIC METABOLIC PANEL
Anion gap: 10 (ref 5–15)
BUN: 12 mg/dL (ref 8–23)
CO2: 25 mmol/L (ref 22–32)
Calcium: 8.2 mg/dL — ABNORMAL LOW (ref 8.9–10.3)
Chloride: 95 mmol/L — ABNORMAL LOW (ref 98–111)
Creatinine, Ser: 0.56 mg/dL (ref 0.44–1.00)
GFR calc Af Amer: >60 mL/min (ref >60)
GFR calc non Af Amer: >60 mL/min (ref >60)
Glucose, Bld: 141 mg/dL — ABNORMAL HIGH (ref 70–99)
Potassium: 4 mmol/L (ref 3.5–5.1)
Sodium: 130 mmol/L — ABNORMAL LOW (ref 135–145)

## 2018-06-19 LAB — LACTATE DEHYDROGENASE: LDH: 114 U/L (ref 98–192)

## 2018-06-19 LAB — RETICULOCYTES
Immature Retic Fract: 20 % — ABNORMAL HIGH (ref 2.3–15.9)
RBC.: 2.87 MIL/uL — ABNORMAL LOW (ref 3.87–5.11)
Retic Count, Absolute: 53.1 10*3/uL (ref 19.0–186.0)
Retic Ct Pct: 1.9 % (ref 0.4–3.1)

## 2018-06-19 LAB — FERRITIN: Ferritin: 309 ng/mL — ABNORMAL HIGH (ref 11–307)

## 2018-06-19 LAB — ALBUMIN: Albumin: 2.9 g/dL — ABNORMAL LOW (ref 3.5–5.0)

## 2018-06-19 LAB — FOLATE: Folate: 19.3 ng/mL (ref >5.9)

## 2018-06-19 MED ORDER — LIP MEDEX EX OINT
TOPICAL_OINTMENT | CUTANEOUS | Status: AC
  Administered 2018-06-19: 22:00:00
  Filled 2018-06-19: qty 7

## 2018-06-19 MED ORDER — NICOTINE 21 MG/24HR TD PT24
21.0000 mg | MEDICATED_PATCH | Freq: Every day | TRANSDERMAL | Status: DC
  Administered 2018-06-19 – 2018-06-20 (×2): 21 mg via TRANSDERMAL
  Filled 2018-06-19 (×3): qty 1

## 2018-06-19 MED ORDER — FUROSEMIDE 10 MG/ML IJ SOLN
20.0000 mg | Freq: Three times a day (TID) | INTRAMUSCULAR | Status: DC
  Administered 2018-06-19 (×2): 20 mg via INTRAVENOUS
  Filled 2018-06-19 (×2): qty 2

## 2018-06-19 MED ORDER — PRO-STAT SUGAR FREE PO LIQD
30.0000 mL | Freq: Two times a day (BID) | ORAL | Status: DC
  Administered 2018-06-19 – 2018-06-20 (×3): 30 mL via ORAL
  Filled 2018-06-19 (×5): qty 30

## 2018-06-19 NOTE — Progress Notes (Addendum)
[facility]                                                                              Patient Demographics  Linda Ray, is a 84 y.o. female, DOB - 1935-02-14, RWE:[id number]  Admit date - 06/18/2018   Admitting Physician Bonnell Public, MD  Outpatient Primary MD for the patient is Vicenta Aly, [place]  Outpatient specialists:   LOS - 0  days   Medical records reviewed and are as summarized below:    Chief Complaint  Patient presents with  . Abnormal Lab       Brief summary   Patient is a 84 year old female with history of breast CA, arthritis, borderline diabetes mellitus, restless leg syndrome, chronic back pain, tobacco use, moderate COPD, presented with generalized debility and hyponatremia.  Patient was recently diagnosed with bilateral TMJ about 2 weeks ago and was treated with anti-inflammatory meds and steroids.  She was admitted in the hospital 4/14 to 4/16 with a rash involving bilateral legs and was found to have hyponatremia.  Patient was extensively worked up.  She was noted to have elevated WBCs, elevated ESR, elevated MPO.  She was advised to follow-up with rheumatology. Patient also reported lower extremity edema, was seen by her PCP and was placed on Lasix 40 mg daily for 3 days, had only taken it for 2 days PTA.  PCP saw her on the day of admission and was found to have sodium of 125, potassium 6.2 and was sent to ED for further work-up.  Assessment & Plan    Principal Problem: Acute on chronic hyponatremia with hypervolemia and component of SIADH -Presenting with a hypervolemia with 2+ pitting edema, hypoalbuminemia with albumin of 2.3.  No prior cardiac work-up -Urine osmolality 423, urine sodium 40, cortisol TSH normal, serum osmolality 275 -Given history of smoking, CA breast, CT chest was done which was negative for any malignancy. CT head negative -Patient placed on IV Lasix, IV albumin -Sodium improving, 126 at the time of  admission, 130 today - will also check venous Doppler lower extremity and 2D echo (rule out underlying heart failure) -Increase Lasix to 20 mg 3 times daily, elevate legs.  Follow I's and O's closely, negative balance of 1.8 L   Active Problems: Moderate to severe protein calorie malnutrition, severe hypoalbuminemia -Albumin 2.3 at the time of admission, improved to 2.9 after albumin IV -Per patient she has not been able to eat much due to her recent TMJ, has hard time chewing -Also placed on pro-stat   Possibly undiagnosed vasculitic process -Patient had recent admission with vasculitis work-up, elevated ESR, CRP, MPO elevated, complement levels normal. -CK was normal, there was concern for PMR.  Patient was recommended rheumatology follow-up. -I discussed in detail with the patient today however she does not want to pursue any rheumatology work-up outpatient.  Strongly recommend PCP to follow this closely.   History of breast cancer, left breast - lobular T2N0 -Last PET/CT in 12/2017 showed no acute abnormality     Smoking history -Patient counseled on smoking cessation, placed on nicotine patch    COPD, moderate (HCC) Currently stable, no  wheezing  Anemia of chronic disease -Anemia panel consistent with anemia of chronic disease, baseline 10-11.  Code Status: Full code DVT Prophylaxis: Lovenox Family Communication: Discussed in detail with the patient, all imaging results, lab results explained to the patient    Disposition Plan: Hopefully DC in the next 24 to 48 hours  Time Spent in minutes 35 minutes  Procedures:  None  Consultants:   None  Antimicrobials:   Anti-infectives (From admission, onward)   None         Medications  Scheduled Meds: . aspirin EC  81 mg Oral Daily  . B-complex with vitamin C  1 tablet Oral Daily  . enoxaparin (LOVENOX) injection  40 mg Subcutaneous Q24H  . feeding supplement (ENSURE ENLIVE)  237 mL Oral BID BM  . feeding  supplement (PRO-STAT SUGAR FREE 64)  30 mL Oral BID  . furosemide  20 mg Intravenous TID  . insulin aspart  0-5 Units Subcutaneous QHS  . insulin aspart  0-9 Units Subcutaneous TID WC  . nicotine  21 mg Transdermal Daily  . Vitamin D (Ergocalciferol)  50,000 Units Oral Q Thu   Continuous Infusions: . albumin human 25 g (06/19/18 1035)   PRN Meds:.ipratropium-albuterol      Subjective:   Linda Ray was seen and examined today.  Did not sleep well, feeling miserable today.  States Lasix is working and feels peripheral edema is improving. Patient denies dizziness, chest pain, shortness of breath, abdominal pain, N/V/D/C. No acute events overnight.    Objective:   Vitals:   06/18/18 1830 06/18/18 1952 06/18/18 2135 06/19/18 0610  BP: (!) 115/37 (!) 121/45 (!) 110/45 (!) 127/45  Pulse: 98 (!) 102 98 98  Resp: (!) _0 Temp:  97.8 F (36.6 C) 98.8 F (37.1 C) 99.1 F (37.3 C)  TempSrc:  Oral Oral Oral  SpO2: 100% 100% 97% 97%  Weight:  60.5 kg    Height:  5' 2" (1.575 m)      Intake/Output Summary (Last 24 hours) at 06/19/2018 1130 Last data filed at 06/19/2018 1000 Gross per 24 hour  Intake 340 ml  Output 2200 ml  Net -1860 ml     Wt Readings from Last 3 Encounters:  06/18/18 60.5 kg  06/10/18 62.1 kg  07/30/16 60.6 kg     Exam  General: Alert and oriented x 3, NAD  Eyes:   HEENT:  Atraumatic, normocephalic  Cardiovascular: S1 S2 auscultated, Regular rate and rhythm.  Respiratory: Clear to auscultation bilaterally, no wheezing, rales or rhonchi  Gastrointestinal: Soft, nontender, nondistended, + bowel sounds  Ext: 2+ pedal edema bilaterally  Neuro: No new deficits  Musculoskeletal: No digital cyanosis, clubbing  Skin: No rashes  Psych: Normal affect and demeanor, alert and oriented x3    Data Reviewed:  I have personally reviewed following labs and imaging studies  Micro Results Recent Results (from the past 240 hour(s))  Urine  culture     Status: Abnormal   Collection Time: 06/10/18  2:50 PM  Result Value Ref Range Status   Specimen Description   Final    URINE, RANDOM Performed at [facility], [address] [place], [place] [address]    Special Requests   Final    NONE Performed at [facility], [address] [place], [place] [address]    Culture MULTIPLE SPECIES PRESENT, SUGGEST RECOLLECTION (A)  Final   Report Status 06/11/2018 FINAL  Final  Radiology Reports Dg Chest 2 View  Result Date: 06/10/2018 CLINICAL DATA:  Bilateral leg pain.  Cough. EXAM: CHEST - 2 VIEW COMPARISON:  January 26, 2010 FINDINGS: The cardiomediastinal silhouette is stable with mild stable cardiomegaly. The hila and mediastinum are normal. No nodules or masses. No focal infiltrates. No acute abnormalities. IMPRESSION: No active cardiopulmonary disease. Electronically Signed   By: Dorise Bullion III M.D   On: 06/10/2018 15:19   Ct Head Wo Contrast  Result Date: 06/18/2018 CLINICAL DATA:  Headache EXAM: CT HEAD WITHOUT CONTRAST TECHNIQUE: Contiguous axial images were obtained from the base of the skull through the vertex without intravenous contrast. COMPARISON:  None. FINDINGS: Brain: No acute territorial infarction, hemorrhage or intracranial mass. Patchy hypodensity in the bilateral white matter. Mild atrophy. Prominent ventricles, felt secondary to atrophy. Vascular: No hyperdense vessels. Vertebral and carotid vascular calcification Skull: Normal. Negative for fracture or focal lesion. Sinuses/Orbits: No acute finding. Other: None IMPRESSION: 1. No CT evidence for acute intracranial abnormality. 2. Atrophy and mild small vessel ischemic changes of the white matter. Electronically Signed   By: Donavan Foil M.D.   On: 06/18/2018 17:08   Ct Chest W Contrast  Result Date: 06/18/2018 CLINICAL DATA:  84 year old female with breast cancer bilateral leg swelling EXAM: CT CHEST WITH CONTRAST TECHNIQUE:  Multidetector CT imaging of the chest was performed during intravenous contrast administration. CONTRAST:  58m OMNIPAQUE IOHEXOL 300 MG/ML  SOLN COMPARISON:  07/26/2016, 07/13/2015 FINDINGS: Cardiovascular: Heart enlarged. There is trace pericardial fluid/thickening, which appears slightly increased from the most recent comparison CT. Mild calcifications of the aortic arch. Normal course caliber and contour of the thoracic aorta. Atherosclerotic changes. No dissection or aneurysm. Calcifications of the left anterior descending, circumflex, right coronary arteries. No filling defects of the main pulmonary artery lobar pulmonary arteries or proximal segmental arteries, with the distal pulmonary arteries not well evaluated given the timing of the contrast bolus. Mediastinum/Nodes: Unremarkable thoracic inlet. There are small lymph nodes in the paratracheal nodal stations, which are similar to the comparison CT. These are not enlarged by CT size criteria and remain unchanged from the prior. Unremarkable course of the thoracic esophagus. Lungs/Pleura: Minimal subpleural reticulation, likely indicating changes of interstitial lung disease. No pneumothorax or pleural effusion. Pleuroparenchymal thickening at the bilateral lung apices. Subpleural nodule/lymph node of the right lower lobe unchanged from the comparison CT measuring approximately 6 mm. (Image 70 of series 5). No pleural effusion. No pneumothorax. No confluent airspace disease. No bronchial wall thickening or endobronchial debris. Upper Abdomen: No acute finding of the upper abdomen. Musculoskeletal: No acute displaced fracture. Multilevel degenerative changes of the visualized spine. Left mastectomy. IMPRESSION: No acute CT finding. Redemonstration of cardiomegaly, with trace pericardial fluid/thickening. Aortic atherosclerosis and associated coronary artery disease. Aortic Atherosclerosis (ICD10-I70.0). Mild fibrotic changes of the lungs, similar to  comparison CT study. Electronically Signed   By: JCorrie MckusickD.O.   On: 06/18/2018 21:08   Dg Chest Port 1 View  Result Date: 06/12/2018 CLINICAL DATA:  Rhonchi. EXAM: PORTABLE CHEST 1 VIEW COMPARISON:  Two-view chest x-ray 06/10/2018 FINDINGS: The heart is enlarged. Atherosclerotic calcifications are present at the aortic arch. Chronic interstitial coarsening is again seen. Superimposed bibasilar airspace disease is present. Small effusions are suspected. The visualized soft tissues and bony thorax are unremarkable. IMPRESSION: 1. New bibasilar airspace disease superimposed on chronic interstitial coarsening. While this could represent mild congestive heart failure, infection is not excluded. 2. Probable small bilateral pleural effusions. 3. Stable cardiomegaly and  aortic atherosclerosis. Electronically Signed   By: San Morelle M.D.   On: 06/12/2018 04:50    Lab Data:  CBC: Recent Labs  Lab 06/18/18 2013 06/19/18 0640  WBC 18.1* 15.4*  HGB 9.4* 8.8*  HCT 28.2* 27.1*  MCV 92.8 93.1  PLT 613* 726*   Basic Metabolic Panel: Recent Labs  Lab 06/18/18 1627 06/18/18 2013 06/19/18 0640  NA 126*  --  130*  K 4.4  --  4.0  CL 91*  --  95*  CO2 27  --  25  GLUCOSE 165*  --  141*  BUN 14  --  12  CREATININE 0.65 0.60 0.56  CALCIUM 8.0*  --  8.2*  MG 2.1 2.0  --   PHOS  --  3.0  --    GFR: Estimated Creatinine Clearance: 46.5 mL/min (by C-G formula based on SCr of 0.56 mg/dL). Liver Function Tests: Recent Labs  Lab 06/18/18 1627 06/18/18 2013 06/19/18 0640  AST 34 25  --   ALT 49* 43  --   ALKPHOS 85 78  --   BILITOT 0.6 0.6  --   PROT 5.6* 5.4*  --   ALBUMIN 2.3* 2.3* 2.9*   No results for input(s): LIPASE, AMYLASE in the last 168 hours. No results for input(s): AMMONIA in the last 168 hours. Coagulation Profile: No results for input(s): INR, PROTIME in the last 168 hours. Cardiac Enzymes: Recent Labs  Lab 06/18/18 1627  TROPONINI 0.03*   BNP (last 3  results) No results for input(s): PROBNP in the last 8760 hours. HbA1C: No results for input(s): HGBA1C in the last 72 hours. CBG: Recent Labs  Lab 06/18/18 2136 06/19/18 0744  GLUCAP 127* 130*   Lipid Profile: No results for input(s): CHOL, HDL, LDLCALC, TRIG, CHOLHDL, LDLDIRECT in the last 72 hours. Thyroid Function Tests: Recent Labs    06/18/18 1820  TSH 2.873   Anemia Panel: Recent Labs    06/19/18 0640  VITAMINB12 320  FOLATE 19.3  FERRITIN 309*  TIBC 177*  IRON 10*  RETICCTPCT 1.9   Urine analysis:    Component Value Date/Time   COLORURINE YELLOW 06/18/2018 1820   APPEARANCEUR HAZY (A) 06/18/2018 1820   LABSPEC 1.034 (H) 06/18/2018 1820   PHURINE 6.0 06/18/2018 1820   GLUCOSEU NEGATIVE 06/18/2018 1820   HGBUR NEGATIVE 06/18/2018 1820   BILIRUBINUR NEGATIVE 06/18/2018 1820   KETONESUR NEGATIVE 06/18/2018 1820   PROTEINUR NEGATIVE 06/18/2018 1820   NITRITE NEGATIVE 06/18/2018 1820   LEUKOCYTESUR TRACE (A) 06/18/2018 1820     Breea Loncar M.D. [facility] 06/19/2018, 11:30 AM  Pager: [phone] Between 7am to 7pm - call Pager - [phone]  After 7pm go to [email or web address] - password [email or web address]  Call night coverage person covering after 7pm

## 2018-06-19 NOTE — Progress Notes (Signed)
Bilateral lower extremity venous duplex completed. Preliminary results in Chart review CV proc. [facility], [place] 06/19/2018, 2:15 PM

## 2018-06-19 NOTE — Progress Notes (Signed)
  Echocardiogram 2D Echocardiogram has been performed.  Linda Ray M 06/19/2018, 2:22 PM

## 2018-06-19 NOTE — Progress Notes (Signed)
Initial Nutrition Assessment  INTERVENTION:   -Calorie Count 4/23-4/24 -Continue Ensure Enlive po BID, each supplement provides 350 kcal and 20 grams of protein -Continue Prostat liquid protein PO 30 ml BID with meals, each supplement provides 100 kcal, 15 grams protein.  NUTRITION DIAGNOSIS:   Inadequate oral intake related to mouth pain(TMJ) as evidenced by per patient/family report.  GOAL:   Patient will meet greater than or equal to 90% of their needs  MONITOR:   PO intake, Supplement acceptance, Labs, Weight trends, I & O's  REASON FOR ASSESSMENT:   Consult Assessment of nutrition requirement/status, Calorie Count  ASSESSMENT:    84 year old female with history of breast CA, arthritis, borderline diabetes mellitus, restless leg syndrome, chronic back pain, tobacco use, moderate COPD, admitted with generalized debility and hyponatremia.   **RD working [employer] ordered per MD for patient who was [facility]. During that admission, pt c/o mouth pain from bilateral TMJ, was placed on antibiotics. Since discharging pt continued to have pain with chewing and was eating little. Pt consumed 75-100% of meals during previous admission. Overall pt has not eaten consisently for 2 weeks now. Ensure and Prostat supplements have been ordered per MD. Will assess acceptance per Calorie Count.  Per weight records, pt has had stable weight.  Medications: B complex w/ Vitamin C tablet daily, IV Lasix TID, Vitamin D capsule daily Labs reviewed: CBGs: 130-226 Low Na Mg/Phos WNL   NUTRITION - FOCUSED PHYSICAL EXAM:  Unable to perform per department requirements to work remotely.  Diet Order:   Diet Order            Diet Heart Room service appropriate? Yes; Fluid consistency: Thin  Diet effective now              EDUCATION NEEDS:   No education needs have been identified at this time  Skin:  Skin Assessment: Reviewed RN Assessment  Last  BM:  4/22  Height:   Ht Readings from Last 1 Encounters:  06/18/18 5\' 2"  (1.575 m)    Weight:   Wt Readings from Last 1 Encounters:  06/18/18 60.5 kg    Ideal Body Weight:  50 kg  BMI:  Body mass index is 24.4 kg/m.  Estimated Nutritional Needs:   Kcal:  1500-1700  Protein:  65-75g  Fluid:  1.5L/day  Clayton Bibles, MS, RD, LDN [place] Dietitian Pager: [phone] After Hours Pager: [phone]

## 2018-06-20 LAB — CBC
HCT: 28.4 % — ABNORMAL LOW (ref 36.0–46.0)
Hemoglobin: 9.3 g/dL — ABNORMAL LOW (ref 12.0–15.0)
MCH: 30.2 pg (ref 26.0–34.0)
MCHC: 32.7 g/dL (ref 30.0–36.0)
MCV: 92.2 fL (ref 80.0–100.0)
Platelets: 608 10*3/uL — ABNORMAL HIGH (ref 150–400)
RBC: 3.08 MIL/uL — ABNORMAL LOW (ref 3.87–5.11)
RDW: 13.6 % (ref 11.5–15.5)
WBC: 17.5 10*3/uL — ABNORMAL HIGH (ref 4.0–10.5)
nRBC: 0 % (ref 0.0–0.2)

## 2018-06-20 LAB — BASIC METABOLIC PANEL
Anion gap: 9 (ref 5–15)
BUN: 15 mg/dL (ref 8–23)
CO2: 27 mmol/L (ref 22–32)
Calcium: 8.1 mg/dL — ABNORMAL LOW (ref 8.9–10.3)
Chloride: 92 mmol/L — ABNORMAL LOW (ref 98–111)
Creatinine, Ser: 0.62 mg/dL (ref 0.44–1.00)
GFR calc Af Amer: >60 mL/min (ref >60)
GFR calc non Af Amer: >60 mL/min (ref >60)
Glucose, Bld: 191 mg/dL — ABNORMAL HIGH (ref 70–99)
Potassium: 3.4 mmol/L — ABNORMAL LOW (ref 3.5–5.1)
Sodium: 128 mmol/L — ABNORMAL LOW (ref 135–145)

## 2018-06-20 LAB — GLUCOSE, CAPILLARY
Glucose-Capillary: 186 mg/dL — ABNORMAL HIGH (ref 70–99)
Glucose-Capillary: 213 mg/dL — ABNORMAL HIGH (ref 70–99)
Glucose-Capillary: 163 mg/dL — ABNORMAL HIGH (ref 70–99)
Glucose-Capillary: 146 mg/dL — ABNORMAL HIGH (ref 70–99)

## 2018-06-20 LAB — ALBUMIN: Albumin: 3.6 g/dL (ref 3.5–5.0)

## 2018-06-20 MED ORDER — INSULIN ASPART 100 UNIT/ML ~~LOC~~ SOLN
3.0000 [IU] | Freq: Three times a day (TID) | SUBCUTANEOUS | Status: DC
  Administered 2018-06-20: 17:00:00 3 [IU] via SUBCUTANEOUS

## 2018-06-20 MED ORDER — FUROSEMIDE 10 MG/ML IJ SOLN: 20.0000 mg | Freq: Two times a day (BID) | INTRAMUSCULAR | Status: DC

## 2018-06-20 MED ORDER — FUROSEMIDE 40 MG PO TABS
40.0000 mg | ORAL_TABLET | Freq: Every day | ORAL | Status: DC
  Administered 2018-06-20: 40 mg via ORAL
  Filled 2018-06-20: qty 1

## 2018-06-20 MED ORDER — POTASSIUM CHLORIDE CRYS ER 20 MEQ PO TBCR
40.0000 meq | EXTENDED_RELEASE_TABLET | Freq: Once | ORAL | Status: AC
  Administered 2018-06-20: 10:00:00 40 meq via ORAL
  Filled 2018-06-20: qty 2

## 2018-06-20 NOTE — Progress Notes (Signed)
[facility]                                                                              Patient Demographics  Linda Ray, is a 84 y.o. female, DOB - 10-18-1934, AJG:[id number]  Admit date - 06/18/2018   Admitting Physician Bonnell Public, MD  Outpatient Primary MD for the patient is Vicenta Aly, Andrew  Outpatient specialists:   LOS - 1  days   Medical records reviewed and are as summarized below:    Chief Complaint  Patient presents with   Abnormal Lab       Brief summary   Patient is a 84 year old female with history of breast CA, arthritis, borderline diabetes mellitus, restless leg syndrome, chronic back pain, tobacco use, moderate COPD, presented with generalized debility and hyponatremia.  Patient was recently diagnosed with bilateral TMJ about 2 weeks ago and was treated with anti-inflammatory meds and steroids.  She was admitted in the hospital 4/14 to 4/16 with a rash involving bilateral legs and was found to have hyponatremia.  Patient was extensively worked up.  She was noted to have elevated WBCs, elevated ESR, elevated MPO.  She was advised to follow-up with rheumatology. Patient also reported lower extremity edema, was seen by her PCP and was placed on Lasix 40 mg daily for 3 days, had only taken it for 2 days PTA.  PCP saw her on the day of admission and was found to have sodium of 125, potassium 6.2 and was sent to ED for further work-up.  Assessment & Plan    Principal Problem: Acute on chronic hyponatremia with hypervolemia and component of SIADH -Presenting with a hypervolemia with 2+ pitting edema, hypoalbuminemia with albumin of 2.3.  No prior cardiac work-up -Urine osmolality 423, urine sodium 40, cortisol TSH normal, serum osmolality 275 -Given history of smoking, CA breast, CT chest was done which was negative for any malignancy. CT head negative -Patient was placed on IV Lasix and IV albumin.  Peripheral edema improved, DC IV  Lasix, getting volume contracted, will change to oral Lasix 40 mg daily per her outpatient dose. -Negative balance of 4.6 L -Doppler ultrasound of the lower extremity negative for DVT, -2D echo showed EF of 60 to 65%, no WMA -Sodium slightly down today 128, will recheck in a.m   Active Problems: Hypokalemia -Replaced  Moderate to severe protein calorie malnutrition, severe hypoalbuminemia -Albumin 2.3 at the time of admission, improving, 3.6 today  -Per patient she has not been able to eat much due to her recent TMJ, has hard time chewing -Continue IV albumin and pro-stat   Possibly undiagnosed vasculitic process -Patient had recent admission with vasculitis work-up, elevated ESR, CRP, MPO elevated, complement levels normal. -CK was normal, there was concern for PMR.  Patient was recommended rheumatology follow-up. -I discussed in detail with the patient today however she does not want to pursue any rheumatology work-up outpatient.  Strongly recommend PCP to follow this closely.   History of breast cancer, left breast - lobular T2N0 -Last PET/CT in 12/2017 showed no acute abnormality     Smoking history -Patient counseled on smoking cessation, placed  on nicotine patch    COPD, moderate (HCC) Currently stable, no wheezing  Anemia of chronic disease -Anemia panel consistent with anemia of chronic disease, baseline 10-11.  Leukocytosis Likely due to steroids recently, afebrile, no signs or symptoms of any infection.  UA negative for UTI.  CT chest negative for any pneumonia  Code Status: Full code DVT Prophylaxis: Lovenox Family Communication: Discussed in detail with the patient, all imaging results, lab results explained to the patient    Disposition Plan: Hopefully DC in the next 24 to 48 hours  Time Spent in minutes 35 minutes  Procedures:  None  Consultants:   None  Antimicrobials:   Anti-infectives (From admission, onward)   None          Medications  Scheduled Meds:  aspirin EC  81 mg Oral Daily   B-complex with vitamin C  1 tablet Oral Daily   enoxaparin (LOVENOX) injection  40 mg Subcutaneous Q24H   feeding supplement (ENSURE ENLIVE)  237 mL Oral BID BM   feeding supplement (PRO-STAT SUGAR FREE 64)  30 mL Oral BID   furosemide  40 mg Oral Daily   insulin aspart  0-5 Units Subcutaneous QHS   insulin aspart  0-9 Units Subcutaneous TID WC   insulin aspart  3 Units Subcutaneous TID WC   nicotine  21 mg Transdermal Daily   Vitamin D (Ergocalciferol)  50,000 Units Oral Q Thu   Continuous Infusions:  albumin human 25 g (06/20/18 1012)   PRN Meds:.ipratropium-albuterol      Subjective:   Linda Ray was seen and examined today.  Not sleeping well, feels tired today.  Lower extremity edema significantly improved from yesterday. Patient denies dizziness, chest pain, shortness of breath, abdominal pain, N/V/D/C. No acute events overnight.    Objective:   Vitals:   06/19/18 0610 06/19/18 1519 06/20/18 0456 06/20/18 1203  BP: (!) 127/45 (!) 136/52 (!) 135/50   Pulse: 98 95 (!) 101   Resp: '18 17 17   '$ Temp: 99.1 F (37.3 C) 98.7 F (37.1 C) 99 F (37.2 C)   TempSrc: Oral Oral Oral   SpO2: 97% 97% 91% 93%  Weight:      Height:        Intake/Output Summary (Last 24 hours) at 06/20/2018 1232 Last data filed at 06/20/2018 0855 Gross per 24 hour  Intake 240 ml  Output 3000 ml  Net -2760 ml     Wt Readings from Last 3 Encounters:  06/18/18 60.5 kg  06/10/18 62.1 kg  07/30/16 60.6 kg   Physical Exam  General: Alert and oriented x 3, NAD  Eyes:   HEENT:    Cardiovascular: S1 S2 clear, no murmurs, RRR.  Trace pedal edema b/l  Respiratory: CTAB, no wheezing, rales or rhonchi  Gastrointestinal: Soft, nontender, nondistended, NBS  Ext: Trace pedal edema bilaterally, rt>lt  Neuro: no new deficits  Musculoskeletal: No cyanosis, clubbing  Skin: No rashes  Psych: Normal affect  and demeanor, alert and oriented x3      Data Reviewed:  I have personally reviewed following labs and imaging studies  Micro Results Recent Results (from the past 240 hour(s))  Urine culture     Status: Abnormal   Collection Time: 06/10/18  2:50 PM  Result Value Ref Range Status   Specimen Description   Final    URINE, RANDOM Performed at [facility], [address] [place], [place] [address]    Special Requests   Final  NONE Performed at [facility], [address] [place], [place] [address]    Culture MULTIPLE SPECIES PRESENT, SUGGEST RECOLLECTION (A)  Final   Report Status 06/11/2018 FINAL  Final    Radiology Reports Dg Chest 2 View  Result Date: 06/10/2018 CLINICAL DATA:  Bilateral leg pain.  Cough. EXAM: CHEST - 2 VIEW COMPARISON:  January 26, 2010 FINDINGS: The cardiomediastinal silhouette is stable with mild stable cardiomegaly. The hila and mediastinum are normal. No nodules or masses. No focal infiltrates. No acute abnormalities. IMPRESSION: No active cardiopulmonary disease. Electronically Signed   By: Dorise Bullion III M.D   On: 06/10/2018 15:19   Ct Head Wo Contrast  Result Date: 06/18/2018 CLINICAL DATA:  Headache EXAM: CT HEAD WITHOUT CONTRAST TECHNIQUE: Contiguous axial images were obtained from the base of the skull through the vertex without intravenous contrast. COMPARISON:  None. FINDINGS: Brain: No acute territorial infarction, hemorrhage or intracranial mass. Patchy hypodensity in the bilateral white matter. Mild atrophy. Prominent ventricles, felt secondary to atrophy. Vascular: No hyperdense vessels. Vertebral and carotid vascular calcification Skull: Normal. Negative for fracture or focal lesion. Sinuses/Orbits: No acute finding. Other: None IMPRESSION: 1. No CT evidence for acute intracranial abnormality. 2. Atrophy and mild small vessel ischemic changes of the white matter. Electronically Signed   By: Donavan Foil M.D.   On:  06/18/2018 17:08   Ct Chest W Contrast  Result Date: 06/18/2018 CLINICAL DATA:  84 year old female with breast cancer bilateral leg swelling EXAM: CT CHEST WITH CONTRAST TECHNIQUE: Multidetector CT imaging of the chest was performed during intravenous contrast administration. CONTRAST:  49m OMNIPAQUE IOHEXOL 300 MG/ML  SOLN COMPARISON:  07/26/2016, 07/13/2015 FINDINGS: Cardiovascular: Heart enlarged. There is trace pericardial fluid/thickening, which appears slightly increased from the most recent comparison CT. Mild calcifications of the aortic arch. Normal course caliber and contour of the thoracic aorta. Atherosclerotic changes. No dissection or aneurysm. Calcifications of the left anterior descending, circumflex, right coronary arteries. No filling defects of the main pulmonary artery lobar pulmonary arteries or proximal segmental arteries, with the distal pulmonary arteries not well evaluated given the timing of the contrast bolus. Mediastinum/Nodes: Unremarkable thoracic inlet. There are small lymph nodes in the paratracheal nodal stations, which are similar to the comparison CT. These are not enlarged by CT size criteria and remain unchanged from the prior. Unremarkable course of the thoracic esophagus. Lungs/Pleura: Minimal subpleural reticulation, likely indicating changes of interstitial lung disease. No pneumothorax or pleural effusion. Pleuroparenchymal thickening at the bilateral lung apices. Subpleural nodule/lymph node of the right lower lobe unchanged from the comparison CT measuring approximately 6 mm. (Image 70 of series 5). No pleural effusion. No pneumothorax. No confluent airspace disease. No bronchial wall thickening or endobronchial debris. Upper Abdomen: No acute finding of the upper abdomen. Musculoskeletal: No acute displaced fracture. Multilevel degenerative changes of the visualized spine. Left mastectomy. IMPRESSION: No acute CT finding. Redemonstration of cardiomegaly, with trace  pericardial fluid/thickening. Aortic atherosclerosis and associated coronary artery disease. Aortic Atherosclerosis (ICD10-I70.0). Mild fibrotic changes of the lungs, similar to comparison CT study. Electronically Signed   By: JCorrie MckusickD.O.   On: 06/18/2018 21:08   Dg Chest Port 1 View  Result Date: 06/12/2018 CLINICAL DATA:  Rhonchi. EXAM: PORTABLE CHEST 1 VIEW COMPARISON:  Two-view chest x-ray 06/10/2018 FINDINGS: The heart is enlarged. Atherosclerotic calcifications are present at the aortic arch. Chronic interstitial coarsening is again seen. Superimposed bibasilar airspace disease is present. Small effusions are suspected. The visualized soft tissues  and bony thorax are unremarkable. IMPRESSION: 1. New bibasilar airspace disease superimposed on chronic interstitial coarsening. While this could represent mild congestive heart failure, infection is not excluded. 2. Probable small bilateral pleural effusions. 3. Stable cardiomegaly and aortic atherosclerosis. Electronically Signed   By: San Morelle M.D.   On: 06/12/2018 04:50   Vas [place] Lower Extremity Venous (dvt)  Result Date: 06/19/2018  Lower Venous Study Indications: Edema.  Risk Factors: Cancer H/O Breast cancer. Performing Technologist: Toma Copier RVS  Examination Guidelines: A complete evaluation includes B-mode imaging, spectral Doppler, color Doppler, and power Doppler as needed of all accessible portions of each vessel. Bilateral testing is considered an integral part of a complete examination. Limited examinations for reoccurring indications may be performed as noted.  +---------+---------------+---------+-----------+----------+-------+  RIGHT     Compressibility Phasicity Spontaneity Properties Summary  +---------+---------------+---------+-----------+----------+-------+  CFV       Full            Yes       Yes                             +---------+---------------+---------+-----------+----------+-------+  SFJ       Full                                                       +---------+---------------+---------+-----------+----------+-------+  FV Prox   Full            Yes       Yes                             +---------+---------------+---------+-----------+----------+-------+  FV Mid    Full                                                      +---------+---------------+---------+-----------+----------+-------+  FV Distal Full            Yes       Yes                             +---------+---------------+---------+-----------+----------+-------+  PFV       Full            Yes       Yes                             +---------+---------------+---------+-----------+----------+-------+  POP       Full            Yes       Yes                             +---------+---------------+---------+-----------+----------+-------+  PTV       Full                                                      +---------+---------------+---------+-----------+----------+-------+  PERO      Full                                                      +---------+---------------+---------+-----------+----------+-------+   +---------+---------------+---------+-----------+----------+-------+  LEFT      Compressibility Phasicity Spontaneity Properties Summary  +---------+---------------+---------+-----------+----------+-------+  CFV       Full            Yes       Yes                             +---------+---------------+---------+-----------+----------+-------+  SFJ       Full                                                      +---------+---------------+---------+-----------+----------+-------+  FV Prox   Full            Yes       Yes                             +---------+---------------+---------+-----------+----------+-------+  FV Mid    Full                                                      +---------+---------------+---------+-----------+----------+-------+  FV Distal Full            Yes       Yes                              +---------+---------------+---------+-----------+----------+-------+  PFV       Full            Yes       Yes                             +---------+---------------+---------+-----------+----------+-------+  POP       Full            Yes       Yes                             +---------+---------------+---------+-----------+----------+-------+  PTV       Full                                                      +---------+---------------+---------+-----------+----------+-------+  PERO      Full                                                      +---------+---------------+---------+-----------+----------+-------+  Summary: Right: There is no evidence of deep vein thrombosis in the lower extremity. No cystic structure found in the popliteal fossa. Left: There is no evidence of deep vein thrombosis in the lower extremity. No cystic structure found in the popliteal fossa.  *See table(s) above for measurements and observations. Electronically signed by Servando Snare MD on 06/19/2018 at 5:10:04 PM.    Final     Lab Data:  CBC: Recent Labs  Lab 06/18/18 2013 06/19/18 0640 06/20/18 0605  WBC 18.1* 15.4* 17.5*  HGB 9.4* 8.8* 9.3*  HCT 28.2* 27.1* 28.4*  MCV 92.8 93.1 92.2  PLT 613* 605* 562*   Basic Metabolic Panel: Recent Labs  Lab 06/18/18 1627 06/18/18 2013 06/19/18 0640 06/20/18 0605  NA 126*  --  130* 128*  K 4.4  --  4.0 3.4*  CL 91*  --  95* 92*  CO2 27  --  25 27  GLUCOSE 165*  --  141* 191*  BUN 14  --  12 15  CREATININE 0.65 0.60 0.56 0.62  CALCIUM 8.0*  --  8.2* 8.1*  MG 2.1 2.0  --   --   PHOS  --  3.0  --   --    GFR: Estimated Creatinine Clearance: 46.5 mL/min (by C-G formula based on SCr of 0.62 mg/dL). Liver Function Tests: Recent Labs  Lab 06/18/18 1627 06/18/18 2013 06/19/18 0640 06/20/18 0605  AST 34 25  --   --   ALT 49* 43  --   --   ALKPHOS 85 78  --   --   BILITOT 0.6 0.6  --   --   PROT 5.6* 5.4*  --   --   ALBUMIN 2.3* 2.3* 2.9* 3.6   No results  for input(s): LIPASE, AMYLASE in the last 168 hours. No results for input(s): AMMONIA in the last 168 hours. Coagulation Profile: No results for input(s): INR, PROTIME in the last 168 hours. Cardiac Enzymes: Recent Labs  Lab 06/18/18 1627  TROPONINI 0.03*   BNP (last 3 results) No results for input(s): PROBNP in the last 8760 hours. HbA1C: No results for input(s): HGBA1C in the last 72 hours. CBG: Recent Labs  Lab 06/19/18 1201 06/19/18 1726 06/19/18 2207 06/20/18 0728 06/20/18 1136  GLUCAP 226* 137* 167* 186* 213*   Lipid Profile: No results for input(s): CHOL, HDL, LDLCALC, TRIG, CHOLHDL, LDLDIRECT in the last 72 hours. Thyroid Function Tests: Recent Labs    06/18/18 1820  TSH 2.873   Anemia Panel: Recent Labs    06/19/18 0640  VITAMINB12 320  FOLATE 19.3  FERRITIN 309*  TIBC 177*  IRON 10*  RETICCTPCT 1.9   Urine analysis:    Component Value Date/Time   COLORURINE YELLOW 06/18/2018 1820   APPEARANCEUR HAZY (A) 06/18/2018 1820   LABSPEC 1.034 (H) 06/18/2018 1820   PHURINE 6.0 06/18/2018 1820   GLUCOSEU NEGATIVE 06/18/2018 1820   HGBUR NEGATIVE 06/18/2018 1820   BILIRUBINUR NEGATIVE 06/18/2018 1820   KETONESUR NEGATIVE 06/18/2018 1820   PROTEINUR NEGATIVE 06/18/2018 1820   NITRITE NEGATIVE 06/18/2018 1820   LEUKOCYTESUR TRACE (A) 06/18/2018 1820     Breyton Vanscyoc M.D. [facility] 06/20/2018, 12:32 PM  Pager: [phone] Between 7am to 7pm - call Pager - [phone]  After 7pm go to [email or web address] - password [email or web address]  Call night coverage person covering after 7pm

## 2018-06-20 NOTE — Progress Notes (Signed)
Calorie Count Note   Intake minimal due to limited choices on menu, inability to chew due to TMJ, and decreased appetite/early satiety.  Pt doe tno like ensure but is willing to try magic cups and carnation instant breakfast We discussed importance of nutrition after discharge. Her daughter will be staying with her. Pt states she will be discharged tomorrow.  We discussed strategies to increase kcal/protein at home. Pt agreeable.   Diet: Heart Healthy Supplements: Ensure Enlive BID, Prostat BID  Breakfast: 235 kcal  milk with crackers in it, 1/2 container of grits with butter Lunch: 80 kcal 1/2 cup chicken noodle soup Supplements: Pt does not like ensure   Plan:  1. Liberalize diet to Regular, spoke with MD 2. D/C ensure 3. Magic cup with all meals 4. Carnation Instant Breakfast at [employer] 5. Verbal education provided to patient on increasing kcal and protein at home.   [place], [place], [place] Pager [phone] After Hours Pager

## 2018-06-21 LAB — BASIC METABOLIC PANEL
Anion gap: 11 (ref 5–15)
BUN: 15 mg/dL (ref 8–23)
CO2: 26 mmol/L (ref 22–32)
Calcium: 8.6 mg/dL — ABNORMAL LOW (ref 8.9–10.3)
Chloride: 91 mmol/L — ABNORMAL LOW (ref 98–111)
Creatinine, Ser: 0.61 mg/dL (ref 0.44–1.00)
GFR calc Af Amer: >60 mL/min (ref >60)
GFR calc non Af Amer: >60 mL/min (ref >60)
Glucose, Bld: 166 mg/dL — ABNORMAL HIGH (ref 70–99)
Potassium: 4.2 mmol/L (ref 3.5–5.1)
Sodium: 128 mmol/L — ABNORMAL LOW (ref 135–145)

## 2018-06-21 LAB — CBC
HCT: 28.6 % — ABNORMAL LOW (ref 36.0–46.0)
Hemoglobin: 9.4 g/dL — ABNORMAL LOW (ref 12.0–15.0)
MCH: 30.1 pg (ref 26.0–34.0)
MCHC: 32.9 g/dL (ref 30.0–36.0)
MCV: 91.7 fL (ref 80.0–100.0)
Platelets: 678 10*3/uL — ABNORMAL HIGH (ref 150–400)
RBC: 3.12 MIL/uL — ABNORMAL LOW (ref 3.87–5.11)
RDW: 13.5 % (ref 11.5–15.5)
WBC: 18 10*3/uL — ABNORMAL HIGH (ref 4.0–10.5)
nRBC: 0 % (ref 0.0–0.2)

## 2018-06-21 LAB — GLUCOSE, CAPILLARY
Glucose-Capillary: 147 mg/dL — ABNORMAL HIGH (ref 70–99)
Glucose-Capillary: 196 mg/dL — ABNORMAL HIGH (ref 70–99)

## 2018-06-21 MED ORDER — FUROSEMIDE 20 MG PO TABS: 20.0000 mg | ORAL_TABLET | Freq: Every day | ORAL | 3 refills | Status: DC | PRN

## 2018-06-21 MED ORDER — ONDANSETRON 4 MG PO TBDP: 4.0000 mg | ORAL_TABLET | Freq: Three times a day (TID) | ORAL | 0 refills | Status: DC | PRN

## 2018-06-21 MED ORDER — FUROSEMIDE 20 MG PO TABS
20.0000 mg | ORAL_TABLET | Freq: Every day | ORAL | Status: DC
  Administered 2018-06-21: 10:00:00 20 mg via ORAL
  Filled 2018-06-21: qty 1

## 2018-06-21 NOTE — Progress Notes (Signed)
Patient discharged to home with family, discharge instructions reviewed with patient who verbalized understanding. 

## 2018-06-21 NOTE — Discharge Summary (Signed)
Physician Discharge Summary   Patient ID: Linda Ray MRN: [id number] DOB/AGE: [birth year] 84 y.o.  Admit date: 06/18/2018 Discharge date: 06/21/2018  Primary Care Physician:  Vicenta Aly, FNP   Recommendations for Outpatient Follow-up:  1. Follow up with PCP next week 2. Please obtain BMP in one week for Na and renal function   Home Health: None  Equipment/Devices:   Discharge Condition: stable  CODE STATUS: FULL  Diet recommendation: Regular diet   Discharge Diagnoses:    . Hyponatremia-acute on chronic with hypervolemia and component of SIADH Moderate to severe protein calorie malnutrition, severe hypoalbuminemia Possibly undiagnosed vasculitic process . Breast cancer, left breast - lobular T2N0 . COPD, moderate (HCC) Anemia of chronic disease  Consults: None    Allergies:   Allergies  Allergen Reactions  . Codeine   . Sulfa Drugs Cross Reactors   . Tetanus-Diphth-Acell Pertussis Other (See Comments)    Significant local reaction     DISCHARGE MEDICATIONS: Allergies as of 06/21/2018      Reactions   Codeine    Sulfa Drugs Cross Reactors    Tetanus-diphth-acell Pertussis Other (See Comments)   Significant local reaction      Medication List    TAKE these medications   albuterol 108 (90 Base) MCG/ACT inhaler Commonly known as:  VENTOLIN HFA Inhale 1-2 puffs into the lungs every 6 (six) hours as needed for wheezing or shortness of breath.   aspirin 81 MG tablet Take 81 mg by mouth daily.   B-complex with vitamin C tablet Take 1 tablet by mouth daily.   cyclobenzaprine 10 MG tablet Commonly known as:  FLEXERIL Take 10 mg by mouth at bedtime.   ergocalciferol 1.25 MG (50000 UT) capsule Commonly known as:  VITAMIN D2 Take 50,000 Units by mouth once a week.   furosemide 20 MG tablet Commonly known as:  LASIX Take 1 tablet (20 mg total) by mouth daily as needed for fluid or edema.   ondansetron 4 MG disintegrating tablet Commonly known  as:  Zofran ODT Take 1 tablet (4 mg total) by mouth every 8 (eight) hours as needed for nausea or vomiting.   umeclidinium bromide 62.5 MCG/INH Aepb Commonly known as:  INCRUSE ELLIPTA Inhale 1 puff into the lungs daily.        Brief H and P: For complete details please refer to admission H and P, but in brief Patient is a 84 year old female with history of breast CA, arthritis, borderline diabetes mellitus, restless leg syndrome, chronic back pain, tobacco use, moderate COPD, presented with generalized debility and hyponatremia.  Patient was recently diagnosed with bilateral TMJ about 2 weeks ago and was treated with anti-inflammatory meds and steroids.  She was admitted in the hospital 4/14 to 4/16 with a rash involving bilateral legs and was found to have hyponatremia.  Patient was extensively worked up.  She was noted to have elevated WBCs, elevated ESR, elevated MPO.  She was advised to follow-up with rheumatology. Patient also reported lower extremity edema, was seen by her PCP and was placed on Lasix 40 mg daily for 3 days, had only taken it for 2 days PTA.  PCP saw her on the day of admission and was found to have sodium of 125, potassium 6.2 and was sent to ED for further work-up.  Hospital Course:  Acute on chronic hyponatremia with hypervolemia and component of SIADH -Patient presented with hypervolemia with 2+ pitting edema, hypoalbuminemia with albumin of 2.3.  No prior cardiac work-up.  Sodium  126 at the time of admission. -Urine osmolality 423, urine sodium 40, cortisol TSH normal, serum osmolality 275 -Given history of smoking, CA breast, CT chest was done which was negative for any malignancy. CT head negative.  Cortisol level normal 20.8, TSH normal 2.8 -Patient placed on IV Lasix and IV albumin.  Negative balance of 4.7 L. -Doppler lower extremity negative for DVT. 2D echo showed EF of 60 to 65%, no WMA -Patient has been transitioned to oral Lasix, peripheral edema has  resolved, asymptomatic.  Lasix changed to 20 mg as needed for fluid/edema or shortness of breath.  She was recommended to check labs for sodium and renal function next week.  Patient has been having difficulty chewing due to her recent TMJ which also is causing poor nutritional status.    Moderate to severe protein calorie malnutrition, severe hypoalbuminemia -Albumin 2.3 at the time of admission, improved to 3.6 after albumin infusions. -Per patient she has not been able to eat much due to her recent TMJ, has hard time chewing -Also placed on pro-stat, nutrition consult was obtained.  Patient will start Carnation breakfast at home.   Possibly undiagnosed vasculitic process -Patient had recent admission with vasculitis work-up, elevated ESR, CRP, MPO elevated, complement levels normal. -CK was normal, there was concern for PMR.  Patient was recommended rheumatology follow-up. -I discussed in detail with the patient today however she does not want to pursue any rheumatology work-up outpatient.  Strongly recommend PCP to follow this closely.   History of breast cancer, left breast - lobular T2N0 -Last PET/CT in 12/2017 showed no acute abnormality     Smoking history -Patient counseled on smoking cessation, placed on nicotine patch    COPD, moderate (HCC) Currently stable, no wheezing  Anemia of chronic disease -Anemia panel consistent with anemia of chronic disease, baseline 10-11.   Day of Discharge S: No significant peripheral edema, feels a lot better, ambulated with PT, wants to go home.  BP 139/60 (BP Location: Left Arm)   Pulse (!) 105   Temp 98.5 F (36.9 C) (Oral)   Resp 17   Ht _0  (1.575 m)   Wt 60.5 kg   SpO2 91%   BMI 24.40 kg/m   Physical Exam: General: Alert and awake oriented x3 not in any acute distress. HEENT: anicteric sclera, pupils reactive to light and accommodation CVS: S1-S2 clear no murmur rubs or gallops Chest: clear to auscultation  bilaterally, no wheezing rales or rhonchi Abdomen: soft nontender, nondistended, normal bowel sounds Extremities: no cyanosis, clubbing or edema noted bilaterally Neuro: Cranial nerves II-XII intact, no focal neurological deficits   The results of significant diagnostics from this hospitalization (including imaging, microbiology, ancillary and laboratory) are listed below for reference.      Procedures/Studies:  Dg Chest 2 View  Result Date: 06/10/2018 CLINICAL DATA:  Bilateral leg pain.  Cough. EXAM: CHEST - 2 VIEW COMPARISON:  January 26, 2010 FINDINGS: The cardiomediastinal silhouette is stable with mild stable cardiomegaly. The hila and mediastinum are normal. No nodules or masses. No focal infiltrates. No acute abnormalities. IMPRESSION: No active cardiopulmonary disease. Electronically Signed   By: Dorise Bullion III M.D   On: 06/10/2018 15:19   Ct Head Wo Contrast  Result Date: 06/18/2018 CLINICAL DATA:  Headache EXAM: CT HEAD WITHOUT CONTRAST TECHNIQUE: Contiguous axial images were obtained from the base of the skull through the vertex without intravenous contrast. COMPARISON:  None. FINDINGS: Brain: No acute territorial infarction, hemorrhage or intracranial mass. Patchy hypodensity  in the bilateral white matter. Mild atrophy. Prominent ventricles, felt secondary to atrophy. Vascular: No hyperdense vessels. Vertebral and carotid vascular calcification Skull: Normal. Negative for fracture or focal lesion. Sinuses/Orbits: No acute finding. Other: None IMPRESSION: 1. No CT evidence for acute intracranial abnormality. 2. Atrophy and mild small vessel ischemic changes of the white matter. Electronically Signed   By: Donavan Foil M.D.   On: 06/18/2018 17:08   Ct Chest W Contrast  Result Date: 06/18/2018 CLINICAL DATA:  84 year old female with breast cancer bilateral leg swelling EXAM: CT CHEST WITH CONTRAST TECHNIQUE: Multidetector CT imaging of the chest was performed during intravenous  contrast administration. CONTRAST:  61m OMNIPAQUE IOHEXOL 300 MG/ML  SOLN COMPARISON:  07/26/2016, 07/13/2015 FINDINGS: Cardiovascular: Heart enlarged. There is trace pericardial fluid/thickening, which appears slightly increased from the most recent comparison CT. Mild calcifications of the aortic arch. Normal course caliber and contour of the thoracic aorta. Atherosclerotic changes. No dissection or aneurysm. Calcifications of the left anterior descending, circumflex, right coronary arteries. No filling defects of the main pulmonary artery lobar pulmonary arteries or proximal segmental arteries, with the distal pulmonary arteries not well evaluated given the timing of the contrast bolus. Mediastinum/Nodes: Unremarkable thoracic inlet. There are small lymph nodes in the paratracheal nodal stations, which are similar to the comparison CT. These are not enlarged by CT size criteria and remain unchanged from the prior. Unremarkable course of the thoracic esophagus. Lungs/Pleura: Minimal subpleural reticulation, likely indicating changes of interstitial lung disease. No pneumothorax or pleural effusion. Pleuroparenchymal thickening at the bilateral lung apices. Subpleural nodule/lymph node of the right lower lobe unchanged from the comparison CT measuring approximately 6 mm. (Image 70 of series 5). No pleural effusion. No pneumothorax. No confluent airspace disease. No bronchial wall thickening or endobronchial debris. Upper Abdomen: No acute finding of the upper abdomen. Musculoskeletal: No acute displaced fracture. Multilevel degenerative changes of the visualized spine. Left mastectomy. IMPRESSION: No acute CT finding. Redemonstration of cardiomegaly, with trace pericardial fluid/thickening. Aortic atherosclerosis and associated coronary artery disease. Aortic Atherosclerosis (ICD10-I70.0). Mild fibrotic changes of the lungs, similar to comparison CT study. Electronically Signed   By: JCorrie MckusickD.O.   On:  06/18/2018 21:08   Dg Chest Port 1 View  Result Date: 06/12/2018 CLINICAL DATA:  Rhonchi. EXAM: PORTABLE CHEST 1 VIEW COMPARISON:  Two-view chest x-ray 06/10/2018 FINDINGS: The heart is enlarged. Atherosclerotic calcifications are present at the aortic arch. Chronic interstitial coarsening is again seen. Superimposed bibasilar airspace disease is present. Small effusions are suspected. The visualized soft tissues and bony thorax are unremarkable. IMPRESSION: 1. New bibasilar airspace disease superimposed on chronic interstitial coarsening. While this could represent mild congestive heart failure, infection is not excluded. 2. Probable small bilateral pleural effusions. 3. Stable cardiomegaly and aortic atherosclerosis. Electronically Signed   By: CSan MorelleM.D.   On: 06/12/2018 04:50   Vas UKoreaLower Extremity Venous (dvt)  Result Date: 06/19/2018  Lower Venous Study Indications: Edema.  Risk Factors: Cancer H/O Breast cancer. Performing Technologist: VToma CopierRVS  Examination Guidelines: A complete evaluation includes B-mode imaging, spectral Doppler, color Doppler, and power Doppler as needed of all accessible portions of each vessel. Bilateral testing is considered an integral part of a complete examination. Limited examinations for reoccurring indications may be performed as noted.  +---------+---------------+---------+-----------+----------+-------+ RIGHT    CompressibilityPhasicitySpontaneityPropertiesSummary +---------+---------------+---------+-----------+----------+-------+ CFV      Full           Yes      Yes                          +---------+---------------+---------+-----------+----------+-------+  SFJ      Full                                                 +---------+---------------+---------+-----------+----------+-------+ FV Prox  Full           Yes      Yes                           +---------+---------------+---------+-----------+----------+-------+ FV Mid   Full                                                 +---------+---------------+---------+-----------+----------+-------+ FV DistalFull           Yes      Yes                          +---------+---------------+---------+-----------+----------+-------+ PFV      Full           Yes      Yes                          +---------+---------------+---------+-----------+----------+-------+ POP      Full           Yes      Yes                          +---------+---------------+---------+-----------+----------+-------+ PTV      Full                                                 +---------+---------------+---------+-----------+----------+-------+ PERO     Full                                                 +---------+---------------+---------+-----------+----------+-------+   +---------+---------------+---------+-----------+----------+-------+ LEFT     CompressibilityPhasicitySpontaneityPropertiesSummary +---------+---------------+---------+-----------+----------+-------+ CFV      Full           Yes      Yes                          +---------+---------------+---------+-----------+----------+-------+ SFJ      Full                                                 +---------+---------------+---------+-----------+----------+-------+ FV Prox  Full           Yes      Yes                          +---------+---------------+---------+-----------+----------+-------+ FV Mid   Full                                                 +---------+---------------+---------+-----------+----------+-------+  FV DistalFull           Yes      Yes                          +---------+---------------+---------+-----------+----------+-------+ PFV      Full           Yes      Yes                          +---------+---------------+---------+-----------+----------+-------+ POP      Full            Yes      Yes                          +---------+---------------+---------+-----------+----------+-------+ PTV      Full                                                 +---------+---------------+---------+-----------+----------+-------+ PERO     Full                                                 +---------+---------------+---------+-----------+----------+-------+     Summary: Right: There is no evidence of deep vein thrombosis in the lower extremity. No cystic structure found in the popliteal fossa. Left: There is no evidence of deep vein thrombosis in the lower extremity. No cystic structure found in the popliteal fossa.  *See table(s) above for measurements and observations. Electronically signed by Servando Snare MD on 06/19/2018 at 5:10:04 PM.    Final        LAB RESULTS: Basic Metabolic Panel: Recent Labs  Lab 06/18/18 2013  06/20/18 8250 06/21/18 0603  NA  --    < > 128* 128*  K  --    < > 3.4* 4.2  CL  --    < > 92* 91*  CO2  --    < > 27 26  GLUCOSE  --    < > 191* 166*  BUN  --    < > 15 15  CREATININE 0.60   < > 0.62 0.61  CALCIUM  --    < > 8.1* 8.6*  MG 2.0  --   --   --   PHOS 3.0  --   --   --    < > = values in this interval not displayed.   Liver Function Tests: Recent Labs  Lab 06/18/18 1627 06/18/18 2013 06/19/18 0640 06/20/18 0605  AST 34 25  --   --   ALT 49* 43  --   --   ALKPHOS 85 78  --   --   BILITOT 0.6 0.6  --   --   PROT 5.6* 5.4*  --   --   ALBUMIN 2.3* 2.3* 2.9* 3.6   No results for input(s): LIPASE, AMYLASE in the last 168 hours. No results for input(s): AMMONIA in the last 168 hours. CBC: Recent Labs  Lab 06/20/18 0605 06/21/18 0603  WBC 17.5* 18.0*  HGB 9.3* 9.4*  HCT 28.4* 28.6*  MCV 92.2 91.7  PLT 608* 678*   Cardiac Enzymes:  Recent Labs  Lab 06/18/18 1627  TROPONINI 0.03*   BNP: Invalid input(s): POCBNP CBG: Recent Labs  Lab 06/21/18 0729 06/21/18 1141  GLUCAP 147* 196*       Disposition and Follow-up: Discharge Instructions    Diet Carb Modified   Complete by:  As directed    Discharge instructions   Complete by:  As directed    Please take lasix tomorrow and then as needed for fluid/ swelling in ankles or leg. Follow with your doctor next week. You need labs to check your sodium and kidney function.   Increase activity slowly   Complete by:  As directed        DISPOSITION: [place], [place], [place]. Schedule an appointment as soon as possible for a visit.   Specialty:  Nurse Practitioner Why:  please see your doctor thursday or friday to get your labs checked and follow-up Contact information: [address] [place] [address] [phone]            Time coordinating discharge:  35 minutes Signed:   Estill Cotta M.D. [facility] 06/21/2018, 11:55 AM

## 2018-06-21 NOTE — Evaluation (Signed)
Physical Therapy Evaluation Patient Details Name: MYELLE POTEAT MRN: [id number] DOB: 09-02-1934 Today's Date: 06/21/2018   History of Present Illness  84 y.o. female with medical history significant of arthritis, type 2 diabetes, breast cancer, chronic back pain, RLS, recent admission for bilateral leg soreness and rash (DC 06/12/18) now admitted for hyponatremia.   Clinical Impression  Pt is modified independent with mobility. She ambulated 140' with her rollator with no loss of balance. She has assistance available at home from her daughter, who has been doing pt's shopping. From PT standpoint, pt is ready to DC home, no further PT indicated. Will sign off.     Follow Up Recommendations No PT follow up    Equipment Recommendations  None recommended by PT    Recommendations for Other Services       Precautions / Restrictions Precautions Precautions: None Precaution Comments: pt denies falls in past 1 year Restrictions Weight Bearing Restrictions: No      Mobility  Bed Mobility Overal bed mobility: Independent                Transfers Overall transfer level: Modified independent Equipment used: 4-wheeled walker                Ambulation/Gait Ambulation/Gait assistance: Modified independent (Device/Increase time) Gait Distance (Feet): 140 Feet Assistive device: 4-wheeled walker Gait Pattern/deviations: Step-through pattern;Decreased stride length     General Gait Details: steady with rollator, no loss of balance  Stairs            Wheelchair Mobility    Modified Rankin (Stroke Patients Only)       Balance Overall balance assessment: Modified Independent                                           Pertinent Vitals/Pain Pain Assessment: No/denies pain    Home Living Family/patient expects to be discharged to:: Private residence Living Arrangements: Alone Available Help at Discharge: Family;Available PRN/intermittently(son  and daughter can assist as needed, they've been helping to get groceries)   Home Access: Stairs to enter Entrance Stairs-Rails: Right Entrance Stairs-Number of Steps: 4 Home Layout: One level Home Equipment: Walker - 4 wheels;Bedside commode;Shower seat Additional Comments: daughter gets groceries and will stay with pt upon acute DC    Prior Function Level of Independence: Independent with assistive device(s)         Comments: has been using rollator for past 2 weeks     Hand Dominance        Extremity/Trunk Assessment   Upper Extremity Assessment Upper Extremity Assessment: Overall WFL for tasks assessed    Lower Extremity Assessment Lower Extremity Assessment: Overall WFL for tasks assessed       Communication   Communication: No difficulties  Cognition Arousal/Alertness: Awake/alert Behavior During Therapy: WFL for tasks assessed/performed Overall Cognitive Status: Within Functional Limits for tasks assessed                                        General Comments      Exercises     Assessment/Plan    PT Assessment Patent does not need any further PT services  PT Problem List         PT Treatment Interventions      PT Goals (Current  goals can be found in the Care Plan section)  Acute Rehab PT Goals Patient Stated Goal: be able to do own grocery shopping PT Goal Formulation: All assessment and education complete, DC therapy    Frequency     Barriers to discharge        Co-evaluation               AM-PAC PT "6 Clicks" Mobility  Outcome Measure Help needed turning from your back to your side while in a flat bed without using bedrails?: None Help needed moving from lying on your back to sitting on the side of a flat bed without using bedrails?: None Help needed moving to and from a bed to a chair (including a wheelchair)?: None Help needed standing up from a chair using your arms (e.g., wheelchair or bedside chair)?:  None Help needed to walk in hospital room?: None Help needed climbing 3-5 steps with a railing? : None 6 Click Score: 24    End of Session Equipment Utilized During Treatment: Gait belt Activity Tolerance: Patient tolerated treatment well Patient left: in bed;with call bell/phone within reach Nurse Communication: Mobility status      Time: 1138-1150 PT Time Calculation (min) (ACUTE ONLY): 12 min   Charges:   PT Evaluation $PT Eval Low Complexity: 1 Low          Philomena Doheny PT 06/21/2018  Acute Rehabilitation Services Pager [phone] Office [phone]

## 2018-09-03 ENCOUNTER — Encounter: Payer: Self-pay | Admitting: Nurse Practitioner

## 2018-09-03 ENCOUNTER — Ambulatory Visit (INDEPENDENT_AMBULATORY_CARE_PROVIDER_SITE_OTHER): Payer: Medicare Other | Admitting: Nurse Practitioner

## 2018-09-03 ENCOUNTER — Telehealth: Payer: Self-pay

## 2018-09-03 ENCOUNTER — Other Ambulatory Visit: Payer: Self-pay

## 2018-09-03 ENCOUNTER — Telehealth: Payer: Self-pay | Admitting: Nurse Practitioner

## 2018-09-03 VITALS — BP 144/66 | HR 97 | Temp 97.6°F | Ht 61.0 in | Wt 117.1 lb

## 2018-09-03 DIAGNOSIS — D509 Iron deficiency anemia, unspecified: Secondary | ICD-10-CM

## 2018-09-03 DIAGNOSIS — R634 Abnormal weight loss: Secondary | ICD-10-CM | POA: Diagnosis not present

## 2018-09-03 DIAGNOSIS — R195 Other fecal abnormalities: Secondary | ICD-10-CM | POA: Diagnosis not present

## 2018-09-03 NOTE — Telephone Encounter (Signed)
Patient is returning your call.  

## 2018-09-03 NOTE — Telephone Encounter (Signed)
Covid-19 screening questions   Do you now or have you had a fever in the last 14 days? No  Do you have any respiratory symptoms of shortness of breath or cough now or in the last 14 days? Patient has COPD and said nothing is out of the ordinary  Do you have any family members or close contacts with diagnosed or suspected Covid-19 in the past 14 days? No  Have you been tested for Covid-19 and found to be positive? No

## 2018-09-03 NOTE — Progress Notes (Signed)
ASSESSMENT / PLAN:   84 yo female with:   Weight loss, positive IFOBT and anemia which Hematology feels is secondary to iron deficiency - a positive IFOBT suggests colonic source of blood loss as opposed to a guaiac based study which can't differentiate between upper and lower GI source.  Colon neoplasm is certainly on list of DDx.    -lengthy discussion with patient and her daughter. We discussed the benefits and risks of colonoscopy. Patient doesn't feel she is physically able to undergo colonoscopic evaluation. Furthermore, if  Found to have colon neoplasm she wouldn't pursue surgery nor chemoradiation.  -I encouraged patient to let us know if she changed her mind as we would be happy to reevaluate her. Marland Kitchen      HPI:    Chief Complaint:   Weight loss  Patient is an 84 year old female with COPD, DM2, and hx of breast  She is referred by PCP Dr. Vicenta Aly for evaluation of anemia and occult blood in stool.  Patient is here with her daughter.   Ms Greenspan was admitted in April with SOB, treated for CAP. She had several lab abnormalities at the time including hyponatremia, hypokalemia, leukocytosis, thrombocytosis and anemia. Iron studies at the time seemed c/w anemia of chronic disease. She had a rash involving both legs and complained of myalgia type pain in legs.. Inflamatory markers were elevated, rheumatoid factor was slightly elevated, plan was for outpatient Rheumatology evaluation.. Electrolyte abnormalities have been ongoing problems in outpatient setting and exact cause hasn't been determined.   Post hospital discharge patient has followed closely with PCP who has referred her to Rheumatology and Hematology. She has been diagnosed with polyarthropathy and started on low dose of Prednisone. Saw Hematology for persistent leukocytosis / thrombocytosis -felt to be reactive. Further workup suggested anemia was actual from iron deficiency leading to IFOB which was  positive.   Patient denies overt GI blood loss. Bowel habits are unchanged. She gives a history of colon polyps, ? Around year 2006 . No polyps on follow up colonoscopy with [facility]  (complete exam with good prep)  in 2011.  She has no abdominal pain, no N/V. She does report an unexplained 14 pound weight loss over the last two month. Says her appetite is actually good. Her main complaint is that of unrelenting fatigue which she though iron infusion would help but didn't.    Past Medical History:  Diagnosis Date   Anemia    Arthritis    Borderline diabetes    Breast cancer (HCC)    Chronic back pain    COPD (chronic obstructive pulmonary disease) (HCC)    RLS (restless legs syndrome)      Past Surgical History:  Procedure Laterality Date   ABDOMINAL SURGERY  1954   BACK SURGERY  2009   BREAST SURGERY     COLONOSCOPY     [facility]   MASTECTOMY Left 2007   MASTECTOMY     TONSILLECTOMY     Family History  Problem Relation Age of Onset   Colon cancer Mother        age 21   Stroke Father    Cancer Sister        breast, bladder, kidney   Social History   Tobacco Use   Smoking status: Current Every Day Smoker    Packs/day: 1.00    Years: 65.00    Pack  years: 65.00    Types: Cigarettes   Smokeless tobacco: Never Used   Tobacco comment: currently smoking 1ppd 07/30/2016  Substance Use Topics   Alcohol use: Yes    Comment: occasionally   Drug use: No   Current Outpatient Medications  Medication Sig Dispense Refill   albuterol (PROVENTIL HFA;VENTOLIN HFA) 108 (90 Base) MCG/ACT inhaler Inhale 1-2 puffs into the lungs every 6 (six) hours as needed for wheezing or shortness of breath. 1 each 0   aspirin 81 MG tablet Take 81 mg by mouth daily.       B Complex-C (B-COMPLEX WITH VITAMIN C) tablet Take 1 tablet by mouth daily.     ergocalciferol (VITAMIN D2) 1.25 MG (50000 UT) capsule Take 50,000 Units by mouth once a week.      furosemide (LASIX) 20 MG  tablet Take 1 tablet (20 mg total) by mouth daily as needed for fluid or edema. 30 tablet 3   glimepiride (AMARYL) 4 MG tablet Take by mouth.     predniSONE (DELTASONE) 1 MG tablet Take 4 mg by mouth daily.      predniSONE (DELTASONE) 5 MG tablet Take by mouth.     No current facility-administered medications for this visit.    Allergies  Allergen Reactions   Codeine    Sulfa Drugs Cross Reactors    Tetanus-Diphth-Acell Pertussis Other (See Comments)    Significant local reaction     Review of Systems: Positive for urine leakage. All other systems reviewed and negative except where noted in HPI.    Physical Exam:    Wt Readings from Last 3 Encounters:  09/03/18 117 lb 2 oz (53.1 kg)  06/18/18 133 lb 6.1 oz (60.5 kg)  06/10/18 137 lb (62.1 kg)    BP (!) 144/66    Pulse 97    Temp 97.6 F (36.4 C) (Temporal)    Ht 5\' 1"  (1.549 m)    Wt 117 lb 2 oz (53.1 kg)    BMI 22.13 kg/m  Constitutional:  Pleasant female in no acute distress. Psychiatric: Normal mood and affect. Behavior is normal. EENT: Pupils normal.  Conjunctivae are normal. No scleral icterus. Neck supple.  Cardiovascular: Normal rate, regular rhythm. No edema Pulmonary/chest: Effort normal and breath sounds normal. No wheezing, rales or rhonchi. Abdominal: Soft, nondistended, nontender. Bowel sounds active throughout. There are no masses palpable.  Neurological: Alert and oriented to person place and time. Skin: Skin is warm and dry. No rashes noted.  Tye Savoy, NP  09/03/2018, 3:40 PM  Cc: Vicenta Aly, [place]

## 2018-09-03 NOTE — Patient Instructions (Signed)
If you are age 82 or older, your body mass index should be between 23-30. Your Body mass index is 22.13 kg/m. If this is out of the aforementioned range listed, please consider follow up with your Primary Care Provider.  If you are age 73 or younger, your body mass index should be between 19-25. Your Body mass index is 22.13 kg/m. If this is out of the aformentioned range listed, please consider follow up with your Primary Care Provider.   Please call us if you change your mind and would like to proceed with colonoscopy.  Thank you for choosing me and [facility].   Tye Savoy, NP

## 2018-09-04 ENCOUNTER — Encounter: Payer: Self-pay | Admitting: Nurse Practitioner

## 2018-09-05 NOTE — Progress Notes (Signed)
____________________________________________________________  Attending physician addendum:  Thank you for sending this case to me. I have reviewed the entire note, and the outlined plan seems appropriate.  Agree seems like multifactorial anemia. EGD and colonoscopy would be reasonable to investigate for any identifiable and treatable source of occult GI blood loss.  This could be performed with the patient changes her mind about it.  Wilfrid Lund, MD  ____________________________________________________________

## 2018-10-18 ENCOUNTER — Emergency Department (HOSPITAL_COMMUNITY): Payer: Medicare Other

## 2018-10-18 ENCOUNTER — Inpatient Hospital Stay (HOSPITAL_COMMUNITY)
Admission: EM | Admit: 2018-10-18 | Discharge: 2018-10-26 | DRG: 871 | Disposition: A | Discharge status: Home Health | Payer: Medicare Other | Attending: Internal Medicine | Admitting: Internal Medicine

## 2018-10-18 DIAGNOSIS — R0602 Shortness of breath: Secondary | ICD-10-CM

## 2018-10-18 DIAGNOSIS — F419 Anxiety disorder, unspecified: Secondary | ICD-10-CM | POA: Diagnosis not present

## 2018-10-18 DIAGNOSIS — D649 Anemia, unspecified: Secondary | ICD-10-CM | POA: Diagnosis present

## 2018-10-18 DIAGNOSIS — D473 Essential (hemorrhagic) thrombocythemia: Secondary | ICD-10-CM | POA: Diagnosis not present

## 2018-10-18 DIAGNOSIS — Z20828 Contact with and (suspected) exposure to other viral communicable diseases: Secondary | ICD-10-CM | POA: Diagnosis present

## 2018-10-18 DIAGNOSIS — I21A1 Myocardial infarction type 2: Secondary | ICD-10-CM | POA: Diagnosis present

## 2018-10-18 DIAGNOSIS — M130 Polyarthritis, unspecified: Secondary | ICD-10-CM

## 2018-10-18 DIAGNOSIS — D72829 Elevated white blood cell count, unspecified: Secondary | ICD-10-CM

## 2018-10-18 DIAGNOSIS — Z885 Allergy status to narcotic agent status: Secondary | ICD-10-CM

## 2018-10-18 DIAGNOSIS — Z800 Family history of malignant neoplasm of digestive organs: Secondary | ICD-10-CM | POA: Diagnosis not present

## 2018-10-18 DIAGNOSIS — J9601 Acute respiratory failure with hypoxia: Secondary | ICD-10-CM | POA: Diagnosis not present

## 2018-10-18 DIAGNOSIS — J440 Chronic obstructive pulmonary disease with acute lower respiratory infection: Secondary | ICD-10-CM | POA: Diagnosis present

## 2018-10-18 DIAGNOSIS — G8929 Other chronic pain: Secondary | ICD-10-CM | POA: Diagnosis present

## 2018-10-18 DIAGNOSIS — D509 Iron deficiency anemia, unspecified: Secondary | ICD-10-CM | POA: Diagnosis present

## 2018-10-18 DIAGNOSIS — I361 Nonrheumatic tricuspid (valve) insufficiency: Secondary | ICD-10-CM | POA: Diagnosis not present

## 2018-10-18 DIAGNOSIS — J189 Pneumonia, unspecified organism: Secondary | ICD-10-CM | POA: Diagnosis present

## 2018-10-18 DIAGNOSIS — Z9012 Acquired absence of left breast and nipple: Secondary | ICD-10-CM

## 2018-10-18 DIAGNOSIS — I5021 Acute systolic (congestive) heart failure: Secondary | ICD-10-CM | POA: Diagnosis present

## 2018-10-18 DIAGNOSIS — I340 Nonrheumatic mitral (valve) insufficiency: Secondary | ICD-10-CM | POA: Diagnosis not present

## 2018-10-18 DIAGNOSIS — J9621 Acute and chronic respiratory failure with hypoxia: Secondary | ICD-10-CM | POA: Diagnosis present

## 2018-10-18 DIAGNOSIS — R7303 Prediabetes: Secondary | ICD-10-CM | POA: Diagnosis not present

## 2018-10-18 DIAGNOSIS — Z853 Personal history of malignant neoplasm of breast: Secondary | ICD-10-CM | POA: Diagnosis not present

## 2018-10-18 DIAGNOSIS — Z7951 Long term (current) use of inhaled steroids: Secondary | ICD-10-CM | POA: Diagnosis not present

## 2018-10-18 DIAGNOSIS — Z7952 Long term (current) use of systemic steroids: Secondary | ICD-10-CM

## 2018-10-18 DIAGNOSIS — E874 Mixed disorder of acid-base balance: Secondary | ICD-10-CM

## 2018-10-18 DIAGNOSIS — J9622 Acute and chronic respiratory failure with hypercapnia: Secondary | ICD-10-CM | POA: Diagnosis present

## 2018-10-18 DIAGNOSIS — J441 Chronic obstructive pulmonary disease with (acute) exacerbation: Secondary | ICD-10-CM | POA: Diagnosis present

## 2018-10-18 DIAGNOSIS — Z888 Allergy status to other drugs, medicaments and biological substances status: Secondary | ICD-10-CM

## 2018-10-18 DIAGNOSIS — Z823 Family history of stroke: Secondary | ICD-10-CM | POA: Diagnosis not present

## 2018-10-18 DIAGNOSIS — J9602 Acute respiratory failure with hypercapnia: Secondary | ICD-10-CM | POA: Diagnosis not present

## 2018-10-18 DIAGNOSIS — D75839 Thrombocytosis, unspecified: Secondary | ICD-10-CM | POA: Diagnosis present

## 2018-10-18 DIAGNOSIS — M549 Dorsalgia, unspecified: Secondary | ICD-10-CM | POA: Diagnosis present

## 2018-10-18 DIAGNOSIS — Z66 Do not resuscitate: Secondary | ICD-10-CM

## 2018-10-18 DIAGNOSIS — T380X5A Adverse effect of glucocorticoids and synthetic analogues, initial encounter: Secondary | ICD-10-CM | POA: Diagnosis present

## 2018-10-18 DIAGNOSIS — I214 Non-ST elevation (NSTEMI) myocardial infarction: Secondary | ICD-10-CM | POA: Diagnosis not present

## 2018-10-18 DIAGNOSIS — E872 Acidosis: Secondary | ICD-10-CM | POA: Diagnosis present

## 2018-10-18 DIAGNOSIS — Z882 Allergy status to sulfonamides status: Secondary | ICD-10-CM

## 2018-10-18 DIAGNOSIS — Z7984 Long term (current) use of oral hypoglycemic drugs: Secondary | ICD-10-CM | POA: Diagnosis not present

## 2018-10-18 DIAGNOSIS — R0603 Acute respiratory distress: Secondary | ICD-10-CM

## 2018-10-18 DIAGNOSIS — A419 Sepsis, unspecified organism: Secondary | ICD-10-CM | POA: Diagnosis present

## 2018-10-18 DIAGNOSIS — G2581 Restless legs syndrome: Secondary | ICD-10-CM | POA: Diagnosis present

## 2018-10-18 DIAGNOSIS — E440 Moderate protein-calorie malnutrition: Secondary | ICD-10-CM | POA: Diagnosis present

## 2018-10-18 DIAGNOSIS — Z7982 Long term (current) use of aspirin: Secondary | ICD-10-CM

## 2018-10-18 DIAGNOSIS — J449 Chronic obstructive pulmonary disease, unspecified: Secondary | ICD-10-CM | POA: Diagnosis not present

## 2018-10-18 DIAGNOSIS — E119 Type 2 diabetes mellitus without complications: Secondary | ICD-10-CM | POA: Diagnosis not present

## 2018-10-18 DIAGNOSIS — Z681 Body mass index (BMI) 19 or less, adult: Secondary | ICD-10-CM | POA: Diagnosis not present

## 2018-10-18 DIAGNOSIS — R195 Other fecal abnormalities: Secondary | ICD-10-CM | POA: Diagnosis not present

## 2018-10-18 DIAGNOSIS — F17210 Nicotine dependence, cigarettes, uncomplicated: Secondary | ICD-10-CM

## 2018-10-18 DIAGNOSIS — Z887 Allergy status to serum and vaccine status: Secondary | ICD-10-CM

## 2018-10-18 DIAGNOSIS — Z720 Tobacco use: Secondary | ICD-10-CM | POA: Diagnosis not present

## 2018-10-18 DIAGNOSIS — I5020 Unspecified systolic (congestive) heart failure: Secondary | ICD-10-CM | POA: Diagnosis not present

## 2018-10-18 DIAGNOSIS — M353 Polymyalgia rheumatica: Secondary | ICD-10-CM | POA: Diagnosis present

## 2018-10-18 DIAGNOSIS — E1165 Type 2 diabetes mellitus with hyperglycemia: Secondary | ICD-10-CM | POA: Diagnosis present

## 2018-10-18 DIAGNOSIS — Z881 Allergy status to other antibiotic agents status: Secondary | ICD-10-CM

## 2018-10-18 DIAGNOSIS — I509 Heart failure, unspecified: Secondary | ICD-10-CM

## 2018-10-18 LAB — CBC WITH DIFFERENTIAL/PLATELET
Abs Immature Granulocytes: 0 10*3/uL (ref 0.00–0.07)
Basophils Absolute: 0 10*3/uL (ref 0.0–0.1)
Basophils Relative: 0 %
Eosinophils Absolute: 1.1 10*3/uL — ABNORMAL HIGH (ref 0.0–0.5)
Eosinophils Relative: 3 %
HCT: 37.8 % (ref 36.0–46.0)
Hemoglobin: 11.7 g/dL — ABNORMAL LOW (ref 12.0–15.0)
Lymphocytes Relative: 10 %
Lymphs Abs: 3.5 10*3/uL (ref 0.7–4.0)
MCH: 28.8 pg (ref 26.0–34.0)
MCHC: 31 g/dL (ref 30.0–36.0)
MCV: 93.1 fL (ref 80.0–100.0)
Monocytes Absolute: 0.7 10*3/uL (ref 0.1–1.0)
Monocytes Relative: 2 %
Neutro Abs: 29.8 10*3/uL — ABNORMAL HIGH (ref 1.7–7.7)
Neutrophils Relative %: 85 %
Platelets: 716 10*3/uL — ABNORMAL HIGH (ref 150–400)
RBC: 4.06 MIL/uL (ref 3.87–5.11)
RDW: 16.9 % — ABNORMAL HIGH (ref 11.5–15.5)
WBC: 35.1 10*3/uL — ABNORMAL HIGH (ref 4.0–10.5)
nRBC: 0 % (ref 0.0–0.2)
nRBC: 0 /100 WBC

## 2018-10-18 LAB — POCT I-STAT 7, (LYTES, BLD GAS, ICA,H+H)
Acid-base deficit: 3 mmol/L — ABNORMAL HIGH (ref 0.0–2.0)
Bicarbonate: 25.3 mmol/L (ref 20.0–28.0)
Calcium, Ion: 1.26 mmol/L (ref 1.15–1.40)
HCT: 37 % (ref 36.0–46.0)
Hemoglobin: 12.6 g/dL (ref 12.0–15.0)
O2 Saturation: 95 %
Patient temperature: 101
Potassium: 4.5 mmol/L (ref 3.5–5.1)
Sodium: 133 mmol/L — ABNORMAL LOW (ref 135–145)
TCO2: 27 mmol/L (ref 22–32)
pCO2 arterial: 63.7 mmHg — ABNORMAL HIGH (ref 32.0–48.0)
pH, Arterial: 7.214 — ABNORMAL LOW (ref 7.350–7.450)
pO2, Arterial: 100 mmHg (ref 83.0–108.0)

## 2018-10-18 LAB — BASIC METABOLIC PANEL
Anion gap: 13 (ref 5–15)
BUN: 20 mg/dL (ref 8–23)
CO2: 23 mmol/L (ref 22–32)
Calcium: 8.7 mg/dL — ABNORMAL LOW (ref 8.9–10.3)
Chloride: 97 mmol/L — ABNORMAL LOW (ref 98–111)
Creatinine, Ser: 0.93 mg/dL (ref 0.44–1.00)
GFR calc Af Amer: >60 mL/min (ref >60)
GFR calc non Af Amer: 57 mL/min — ABNORMAL LOW (ref >60)
Glucose, Bld: 194 mg/dL — ABNORMAL HIGH (ref 70–99)
Potassium: 4.3 mmol/L (ref 3.5–5.1)
Sodium: 133 mmol/L — ABNORMAL LOW (ref 135–145)

## 2018-10-18 LAB — TROPONIN I (HIGH SENSITIVITY)
Troponin I (High Sensitivity): 83 ng/L — ABNORMAL HIGH (ref <18)
Troponin I (High Sensitivity): 307 ng/L (ref <18)

## 2018-10-18 LAB — GLUCOSE, CAPILLARY
Glucose-Capillary: 304 mg/dL — ABNORMAL HIGH (ref 70–99)
Glucose-Capillary: 136 mg/dL — ABNORMAL HIGH (ref 70–99)

## 2018-10-18 LAB — MRSA PCR SCREENING: MRSA by PCR: NEGATIVE

## 2018-10-18 LAB — BRAIN NATRIURETIC PEPTIDE: B Natriuretic Peptide: 620.8 pg/mL — ABNORMAL HIGH (ref 0.0–100.0)

## 2018-10-18 LAB — SARS CORONAVIRUS 2 BY RT PCR (HOSPITAL ORDER, PERFORMED IN ~~LOC~~ HOSPITAL LAB): SARS Coronavirus 2: NEGATIVE

## 2018-10-18 LAB — CBG MONITORING, ED: Glucose-Capillary: 316 mg/dL — ABNORMAL HIGH (ref 70–99)

## 2018-10-18 LAB — PROCALCITONIN: Procalcitonin: 1.06 ng/mL

## 2018-10-18 MED ORDER — INSULIN ASPART 100 UNIT/ML ~~LOC~~ SOLN
0.0000 [IU] | Freq: Three times a day (TID) | SUBCUTANEOUS | Status: DC
  Administered 2018-10-18: 4 [IU] via SUBCUTANEOUS

## 2018-10-18 MED ORDER — FUROSEMIDE 10 MG/ML IJ SOLN
40.0000 mg | Freq: Once | INTRAMUSCULAR | Status: AC
  Administered 2018-10-19: 40 mg via INTRAVENOUS
  Filled 2018-10-18 (×2): qty 4

## 2018-10-18 MED ORDER — IPRATROPIUM-ALBUTEROL 0.5-2.5 (3) MG/3ML IN SOLN
RESPIRATORY_TRACT | Status: AC
  Administered 2018-10-18: 3 mL via RESPIRATORY_TRACT
  Filled 2018-10-18: qty 3

## 2018-10-18 MED ORDER — PREDNISONE 20 MG PO TABS: 40.0000 mg | ORAL_TABLET | Freq: Every day | ORAL | Status: DC

## 2018-10-18 MED ORDER — INSULIN ASPART 100 UNIT/ML ~~LOC~~ SOLN: 0.0000 [IU] | Freq: Three times a day (TID) | SUBCUTANEOUS | Status: DC

## 2018-10-18 MED ORDER — INSULIN ASPART 100 UNIT/ML ~~LOC~~ SOLN: 0.0000 [IU] | Freq: Every day | SUBCUTANEOUS | Status: DC

## 2018-10-18 MED ORDER — PIPERACILLIN-TAZOBACTAM 3.375 G IVPB
3.3750 g | Freq: Once | INTRAVENOUS | Status: AC
  Administered 2018-10-18: 3.375 g via INTRAVENOUS
  Filled 2018-10-18: qty 50

## 2018-10-18 MED ORDER — ASPIRIN 81 MG PO CHEW
81.0000 mg | CHEWABLE_TABLET | Freq: Every day | ORAL | Status: DC
  Administered 2018-10-20 – 2018-10-26 (×7): 81 mg via ORAL
  Filled 2018-10-18 (×7): qty 1

## 2018-10-18 MED ORDER — SODIUM CHLORIDE 0.9 % IV SOLN
2.0000 g | Freq: Two times a day (BID) | INTRAVENOUS | Status: DC
  Administered 2018-10-18 – 2018-10-20 (×4): 2 g via INTRAVENOUS
  Filled 2018-10-18 (×4): qty 2

## 2018-10-18 MED ORDER — VANCOMYCIN HCL IN DEXTROSE 1-5 GM/200ML-% IV SOLN
1000.0000 mg | Freq: Once | INTRAVENOUS | Status: AC
  Administered 2018-10-18: 1000 mg via INTRAVENOUS
  Filled 2018-10-18: qty 200

## 2018-10-18 MED ORDER — INSULIN ASPART 100 UNIT/ML ~~LOC~~ SOLN
0.0000 [IU] | Freq: Every day | SUBCUTANEOUS | Status: DC
  Administered 2018-10-19: 2 [IU] via SUBCUTANEOUS
  Administered 2018-10-25: 4 [IU] via SUBCUTANEOUS

## 2018-10-18 MED ORDER — ENOXAPARIN SODIUM 40 MG/0.4ML ~~LOC~~ SOLN
40.0000 mg | SUBCUTANEOUS | Status: DC
  Filled 2018-10-18: qty 0.4

## 2018-10-18 MED ORDER — FUROSEMIDE 10 MG/ML IJ SOLN
40.0000 mg | Freq: Once | INTRAMUSCULAR | Status: AC
  Administered 2018-10-18: 40 mg via INTRAVENOUS
  Filled 2018-10-18: qty 4

## 2018-10-18 MED ORDER — INSULIN ASPART 100 UNIT/ML ~~LOC~~ SOLN
0.0000 [IU] | Freq: Three times a day (TID) | SUBCUTANEOUS | Status: DC
  Administered 2018-10-18: 11 [IU] via SUBCUTANEOUS
  Administered 2018-10-19: 3 [IU] via SUBCUTANEOUS
  Administered 2018-10-19: 5 [IU] via SUBCUTANEOUS
  Administered 2018-10-20 – 2018-10-21 (×3): 3 [IU] via SUBCUTANEOUS
  Administered 2018-10-21: 5 [IU] via SUBCUTANEOUS
  Administered 2018-10-22: 8 [IU] via SUBCUTANEOUS
  Administered 2018-10-22: 2 [IU] via SUBCUTANEOUS
  Administered 2018-10-22: 3 [IU] via SUBCUTANEOUS
  Administered 2018-10-23: 2 [IU] via SUBCUTANEOUS
  Administered 2018-10-23: 15 [IU] via SUBCUTANEOUS
  Administered 2018-10-23: 3 [IU] via SUBCUTANEOUS
  Administered 2018-10-24: 12 [IU] via SUBCUTANEOUS
  Administered 2018-10-24: 8 [IU] via SUBCUTANEOUS
  Administered 2018-10-24: 2 [IU] via SUBCUTANEOUS
  Administered 2018-10-25 (×2): 5 [IU] via SUBCUTANEOUS
  Administered 2018-10-26: 8 [IU] via SUBCUTANEOUS

## 2018-10-18 MED ORDER — IPRATROPIUM-ALBUTEROL 0.5-2.5 (3) MG/3ML IN SOLN
3.0000 mL | RESPIRATORY_TRACT | Status: DC
  Administered 2018-10-18 – 2018-10-19 (×6): 3 mL via RESPIRATORY_TRACT
  Filled 2018-10-18 (×5): qty 3

## 2018-10-18 MED ORDER — ENSURE ENLIVE PO LIQD: 237.0000 mL | Freq: Two times a day (BID) | ORAL | Status: DC

## 2018-10-18 MED ORDER — INSULIN ASPART 100 UNIT/ML ~~LOC~~ SOLN
5.0000 [IU] | Freq: Once | SUBCUTANEOUS | Status: AC
  Administered 2018-10-18: 5 [IU] via SUBCUTANEOUS

## 2018-10-18 NOTE — ED Provider Notes (Signed)
[facility] EMERGENCY DEPARTMENT Provider Note   CSN: [id number] Arrival date & time: 10/18/18  0456     History   Chief Complaint No chief complaint on file.   HPI Linda Ray is a 84 y.o. female.     Patient is an 84 year old female with past medical history of COPD, anemia, breast cancer.  She presents today for evaluation of shortness of breath.  This began yesterday and is rapidly worsening.  According to EMS, the patient was wheezing profusely upon their arrival.  She was given puffs of albuterol along with Solu-Medrol and magnesium.  Is placed on oxygen by nonrebreather and transported here.  The patient is in significant respiratory distress.  She adds little to history secondary to acuity of condition.  The history is provided by the patient.    Past Medical History:  Diagnosis Date   Anemia    Arthritis    Borderline diabetes    Breast cancer (HCC)    Chronic back pain    COPD (chronic obstructive pulmonary disease) (HCC)    RLS (restless legs syndrome)     Patient Active Problem List   Diagnosis Date Noted   Pneumonia 06/12/2018   Hyponatremia 06/10/2018   Rash 06/10/2018   Myalgia 06/10/2018   Sinus tachycardia 06/10/2018   UTI (urinary tract infection) 06/10/2018   COPD, moderate ([place]) 07/27/2015   Smoking history 05/31/2015   Incidental lung nodule, > 19mm and < 89mm 05/31/2015   Other fatigue 05/31/2015   Breast cancer, left breast - lobular T2N0 12/05/2010    Past Surgical History:  Procedure Laterality Date   ABDOMINAL SURGERY  1954   BACK SURGERY  2009   BREAST SURGERY     COLONOSCOPY     [facility]   MASTECTOMY Left 2007   MASTECTOMY     TONSILLECTOMY       OB History   No obstetric history on file.      Home Medications    Prior to Admission medications   Medication Sig Start Date End Date Taking? Authorizing Provider  albuterol (PROVENTIL HFA;VENTOLIN HFA) 108 (90 Base) MCG/ACT inhaler  Inhale 1-2 puffs into the lungs every 6 (six) hours as needed for wheezing or shortness of breath. 06/12/18   [place] Donnamarie Poag, DO  aspirin 81 MG tablet Take 81 mg by mouth daily.      [provider]  B Complex-C (B-COMPLEX WITH VITAMIN C) tablet Take 1 tablet by mouth daily.    [provider]  furosemide (LASIX) 20 MG tablet Take 1 tablet (20 mg total) by mouth daily as needed for fluid or edema. 06/21/18   Rai, Ripudeep K, MD  glimepiride (AMARYL) 4 MG tablet Take by mouth. 08/13/18   [provider]  predniSONE (DELTASONE) 1 MG tablet Take 4 mg by mouth daily.  08/04/18   [provider]  predniSONE (DELTASONE) 5 MG tablet Take by mouth.    [provider]    Family History Family History  Problem Relation Age of Onset   Colon cancer Mother        age 10   Stroke Father    Cancer Sister        breast, bladder, kidney    Social History Social History   Tobacco Use   Smoking status: Current Every Day Smoker    Packs/day: 1.00    Years: 65.00    Pack years: 65.00    Types: Cigarettes   Smokeless tobacco:  Never Used   Tobacco comment: currently smoking 1ppd 07/30/2016  Substance Use Topics   Alcohol use: Yes    Comment: occasionally   Drug use: No     Allergies   Codeine, Sulfa drugs cross reactors, and Tetanus-diphth-acell pertussis   Review of Systems Review of Systems  Unable to perform ROS: Acuity of condition     Physical Exam Updated Vital Signs There were no vitals taken for this visit.  Physical Exam Vitals signs and nursing note reviewed.  Constitutional:      General: She is in acute distress.     Appearance: She is well-developed. She is diaphoretic.     Comments: Patient is a thin 84 year old female.  She appears acutely ill and is in significant respiratory distress.  Patient does respond to voice and commands.  HENT:     Head: Normocephalic and atraumatic.  Neck:     Musculoskeletal: Normal range  of motion and neck supple.  Cardiovascular:     Rate and Rhythm: Normal rate and regular rhythm.     Heart sounds: No murmur. No friction rub. No gallop.   Pulmonary:     Effort: Respiratory distress present.     Breath sounds: Wheezing present.  Abdominal:     General: Bowel sounds are normal. There is no distension.     Palpations: Abdomen is soft.     Tenderness: There is no abdominal tenderness.  Musculoskeletal: Normal range of motion.        General: No swelling or tenderness.     Right lower leg: No edema.     Left lower leg: No edema.  Skin:    General: Skin is warm.  Neurological:     Mental Status: She is alert and oriented to person, place, and time.      ED Treatments / Results  Labs (all labs ordered are listed, but only abnormal results are displayed) Labs Reviewed  SARS CORONAVIRUS 2 (HOSPITAL ORDER, [facility])  BASIC METABOLIC PANEL  CBC WITH DIFFERENTIAL/PLATELET  BRAIN NATRIURETIC PEPTIDE  BLOOD GAS, ARTERIAL  TROPONIN I (HIGH SENSITIVITY)    EKG EKG Interpretation  Date/Time:  Saturday October 18 2018 05:10:54 EDT Ventricular Rate:  155 PR Interval:    QRS Duration: 86 QT Interval:  335 QTC Calculation: 538 R Axis:   14 Text Interpretation:  Sinus tachycardia Anterior infarct, old Nonspecific T abnormalities, lateral leads Baseline wander in lead(s) V5 Confirmed by Veryl Speak [id number]) on 10/18/2018 5:13:50 AM   Radiology No results found.  Procedures Procedures (including critical care time)  Medications Ordered in ED Medications - No data to display   Initial Impression / Assessment and Plan / ED Course  I have reviewed the triage vital signs and the nursing notes.  Pertinent labs & imaging results that were available during my care of the patient were reviewed by me and considered in my medical decision making (see chart for details).  Patient with history of COPD presenting with complaints of shortness of  breath.  She was found to be in significant respiratory distress by EMS.  She was placed on a nonrebreather, then transported here.  She received Solu-Medrol, magnesium, and albuterol in route.  She remains in significant respiratory distress.  Patient with increased work of breathing and tachypneic upon arrival.  Laboratory studies were obtained and chest x-ray was performed.  There was the suggestion of a superimposed pneumonia on chronic changes.  Patient with white count of 35,000.  Blood cultures were obtained and broad-spectrum antibiotics administered.  I had lengthy discussion with both the patient and the daughter at bedside.  The patient is adamant that she not be resuscitated or intubated, but is receptive to BiPAP.  Patient's COVID-19 test returned negative and patient placed on BiPAP which she seems to be tolerating well.  She will be admitted to the internal medicine teaching service for further management.  CRITICAL CARE Performed by: Veryl Speak Total critical care time: 45 minutes Critical care time was exclusive of separately billable procedures and treating other patients. Critical care was necessary to treat or prevent imminent or life-threatening deterioration. Critical care was time spent personally by me on the following activities: development of treatment plan with patient and/or surrogate as well as nursing, discussions with consultants, evaluation of patient's response to treatment, examination of patient, obtaining history from patient or surrogate, ordering and performing treatments and interventions, ordering and review of laboratory studies, ordering and review of radiographic studies, pulse oximetry and re-evaluation of patient's condition.   Final Clinical Impressions(s) / ED Diagnoses   Final diagnoses:  None    ED Discharge Orders    None       Veryl Speak, MD 10/18/18 [phone]

## 2018-10-18 NOTE — Progress Notes (Signed)
Pt Off Bipap upon RT arrival. Pt on RA SPO2 98-100%. No distress noted. RR 22-24. BBS wheezes. Duoneb Given RT will continue to monitor

## 2018-10-18 NOTE — ED Notes (Signed)
X-ray at bedside

## 2018-10-18 NOTE — ED Triage Notes (Signed)
Pt coming from home with complaints of shob this morning. Pt took 8 total puffs of inhaler but said it did not help. Only known hx of COPD. Pt 82% on RA, placed on nonrebreather now at 100%. Received 2g Mg, and solumed. From EMS

## 2018-10-18 NOTE — ED Notes (Signed)
PureWick placed.

## 2018-10-18 NOTE — H&P (Signed)
Date: 10/18/2018               Patient Name:  Linda Ray MRN: [id number]  DOB: 1934-12-25 Age / Sex: 84 y.o., female   PCP: Vicenta Aly, FNP         Medical Service: Internal Medicine Teaching Service         Attending Physician: Dr. Rebeca Alert, [name], MD    First Contact: [name], MD, [name]: [place] [phone])  Second Contact: Shan Levans, MD, Erlene Quan Pager: [place] [phone])       After Hours (After 5p/  First Contact Pager: [phone]  weekends / holidays): Second Contact Pager: [phone]   Chief Complaint: shortness of breath   History of Present Illness: 84 y.o. yo female w/ PMH significant for  Breast cancer,COPD, iron deficiency anemia, steroid induced DM, and chronic back pain presents with shortness of breath since the previous night. She reports that the shortness of breath was sudden in onset and that she tried using 2 puffs of her Provair inhaler without any relief. Due to the continued dyspnea, the patient called EMS and was given more albuterol in addition to solumedrol and magnesium, placed on oxygen via nonrebreather and brought to ED. Patient had a similar episode 2 days ago that was relieved with 2 puffs of albuterol. Of note, she was seen by PCP yesterday and per daughter, chest x-ray at PCP office showed initial formation of pneumonia but no antibiotics were given. She denies chest pain, headaches, dizziness, recent sick contacts, fever/chills.   Per daughter, patient has been experiencing fatigue and unintentional weight loss for 3-4 months. Of note, patient was previously admitted for hyponatremia with hyperkalemia and persistent anemia. Cortisol, renin and aldosterone negative.Noted to have elevated WBCs, ESR and MPO. She was diagnosed with polyarthropathy with suspicion of polymyalgia rheumatica. She was started on low dose prednisone and has had improvement in symptoms.   History obtained by patient and daughter at bedside.   In the ED, patient was in  significant respiratory distress and diaphoretic, requiring BiPAP.   EKG showed sinus tachycardia and chest x-ray with patchy consolidation suggestive of infectious etiology. Tmax 101.1 on presentation with neutrophil predominant leukocytosis. Patient started on IV vancomycin and zosyn and blood cultures sent. Patient admitted for further management by internal medicine teaching service.     Meds:  Current Meds  Medication Sig  . albuterol (PROVENTIL HFA;VENTOLIN HFA) 108 (90 Base) MCG/ACT inhaler Inhale 1-2 puffs into the lungs every 6 (six) hours as needed for wheezing or shortness of breath.  Marland Kitchen aspirin 81 MG tablet Take 81 mg by mouth daily.    . B Complex-C (B-COMPLEX WITH VITAMIN C) tablet Take 1 tablet by mouth daily.  Marland Kitchen glimepiride (AMARYL) 4 MG tablet Take 8 mg by mouth daily with lunch.   . predniSONE (DELTASONE) 5 MG tablet Take 10 mg by mouth.      Allergies: Allergies as of 10/18/2018 - Review Complete 10/18/2018  Allergen Reaction Noted  . Codeine Nausea And Vomiting 06/27/2010  . Sulfa drugs cross reactors Other (See Comments) 06/27/2010  . Tetanus-diphth-acell pertussis Other (See Comments) 09/26/2013  . Metformin and related Diarrhea 07/15/2018   Past Medical History:  Diagnosis Date  . Anemia   . Arthritis   . Borderline diabetes   . Breast cancer ([place])   . Chronic back pain   . COPD (chronic obstructive pulmonary disease) ([place])   . RLS (restless legs syndrome)     Family History:  Family History  Problem Relation Age of Onset  . Colon cancer Mother        age 74  . Stroke Father   . Cancer Sister        breast, bladder, kidney     Social History:  Social History   Tobacco Use  . Smoking status: Current Every Day Smoker    Packs/day: 1.00    Years: 65.00    Pack years: 65.00    Types: Cigarettes  . Smokeless tobacco: Never Used  . Tobacco comment: currently smoking 1ppd 07/30/2016  Substance Use Topics  . Alcohol use: Yes    Comment:  occasionally  . Drug use: No     Review of Systems: Review of Systems  Constitutional: Positive for malaise/fatigue and weight loss. Negative for chills and fever.  Respiratory: Positive for shortness of breath and wheezing. Negative for cough and hemoptysis.   Cardiovascular: Negative for chest pain, palpitations and leg swelling.  Gastrointestinal: Positive for nausea. Negative for abdominal pain, blood in stool, constipation, diarrhea, melena and vomiting.  Musculoskeletal: Negative for falls, joint pain and myalgias.  Neurological: Positive for weakness. Negative for dizziness, sensory change, focal weakness and headaches.     Physical Exam: Blood pressure 122/72, pulse (!) 112, temperature (!) 101.1 F (38.4 C), temperature source Rectal, resp. rate (!) 24, SpO2 100 %. Physical Exam Constitutional:      Comments: Frail appearing elderly woman in respiratory distress on BiPAP  Cardiovascular:     Rate and Rhythm: Regular rhythm. Tachycardia present.     Pulses: Normal pulses.     Heart sounds: No murmur. No friction rub. No gallop.   Pulmonary:     Effort: Respiratory distress present.     Comments: Rhonchi present diffusely in all lung fields  Abdominal:     General: Bowel sounds are normal.     Palpations: Abdomen is soft. There is no mass.     Tenderness: There is no abdominal tenderness. There is no guarding.  Musculoskeletal: Normal range of motion.  Skin:    General: Skin is warm and dry.     Capillary Refill: Capillary refill takes less than 2 seconds.  Neurological:     General: No focal deficit present.     Mental Status: She is alert and oriented to person, place, and time.     Comments: Somnolent but arousable to voice command      EKG: personally reviewed my interpretation is sinus tachycardia at 155bpm with nonspecific T wave abnormalities.   CXR:  IMPRESSION:  Increased left mid lung and right lower lung base patchy consolidation which may represent  superimposed infectious process upon a background of chronic interstitial opacities.  Arterial Blood Gas: pH 7.214; pCO2 63.7; HCO3 25.3, pO2 100; %O2 Sat 95.   Assessment & Plan by Problem: Active Problems:   COPD with acute exacerbation ([place])  84 yo female with PMHx of COPD, iron deficiency anemia, and borderline DM presenting with sudden onset shortness of breath for <1 day requiring NIPPV. ABG with respiratory acidosis and CXR consistent with pneumonia.   Sepsis secondary to Community Acquired Pneumonia:  Patient with history of COPD with sudden onset shortness of breath requiring BiPAP. Patient presented with respiratory distress with hypoxia on arrival and continues to be tachypnic RR 22-34. She presented with Tmax 101.1 that has since resolved. WBC 35.1 with predominantly neutrophils. CXR showed mid left lung and right lower lung base with patchy consolidation representing superimposed infection on chronic interstitial  opacities. Patient started on IV vancomycin and zosyn in the ED but changed to cefepime given known source of infection to be pneumonia. Blood cultures pending.   - Cefepime 2g IV q12h  - Continue to monitor CBC  - Continue to monitor vitals  Respiratory acidosis with metabolic compensation in setting of COPD exacerbation:  Patient with history of COPD and chronic tobacco use with sudden onset dyspnea requiring BiPAP due to respiratory distress and hypoxia with ABG: pH 7.214; pCO2 63.7; HCO3 25.3, pO2 100  - Duoneb 59m neb q4h - Continue to monitor respiratory status   Iron deficiency anemia:  Patient with chronic anemia, previously thought to be anemia of chronic disease but recent iron studies in April 2020 consistent with iron deficiency anemia (Fe: 10, TIBC 177, %Sat: 6, Ferritin 309). She has had two sessions of Injectafer in June 2020. Found to be  FOBT +. Previous colonoscopy in 2011 was normal. However, patient has had 15lb unintentional weight loss and fatigue  over past few months. On chart review, patient seen by GI and does not want to undergo further evaluation for colonic neoplasm at this time.  She denies any GI symptoms at this time. Hb 12.6 this AM.   - Continue to monitor  Polyarthropathy:  Patient has had trouble standing from sitting position for few months with intermittent rash. She was noted to have elevated WBCs, ESR and MPO on previous admissions and referred to rheumatology. She was diagnosed with polyarthropathy with suspicion of polymyalgia rheumatica. She was started on low dose prednisone with resulting improvement in symptoms.  - Continue prednisone '40mg'$  qd - Continue to monitor   Type II DM:  Patient with Hx of Type 2 DM with recent hyperglycemia due to prednisone.  - SSI  - CBG monitoring   Diet: Heart Healthy  DVT Prophylaxis: Lovenox  Code:  DNR/DNI  Dispo: Admit patient to Inpatient with expected length of stay greater than 2 midnights.  Signed: AHarvie Heck MD  Internal Medicine, PGY-1  10/18/2018, 9:55 AM  Pager: [phone]

## 2018-10-18 NOTE — Progress Notes (Signed)
Pharmacy Antibiotic Note  Linda Ray is a 84 y.o. female admitted on 10/18/2018 with pneumonia.  Pharmacy has been consulted for cefepime dosing. -WBC= 35.1, tmax= 101.1, CrCL ~ 35  Plan: -Cefepime 2gm IV q12h -Will follow renal function, cultures and clinical progress      Height: 5\' 1"  (154.9 cm) Weight: 113 lb 5.1 oz (51.4 kg) IBW/kg (Calculated) : 47.8  Temp (24hrs), Avg:98.9 F (37.2 C), Min:97.7 F (36.5 C), Max:101.1 F (38.4 C)  Recent Labs  Lab 10/18/18 0512  WBC 35.1*  CREATININE 0.93    Estimated Creatinine Clearance: 35.2 mL/min (by C-G formula based on SCr of 0.93 mg/dL).    Allergies  Allergen Reactions  . Codeine Nausea And Vomiting  . Sulfa Drugs Cross Reactors Other (See Comments)    unknown  . Tetanus-Diphth-Acell Pertussis Other (See Comments)    Significant local reaction  . Metformin And Related Diarrhea    Antimicrobials this admission: 8/22 cefepime  Dose adjustments this admission:   Microbiology results: 8/22 blood x2  Thank you for allowing pharmacy to be a part of this patient's care.  Hildred Laser, PharmD Clinical Pharmacist **Pharmacist phone directory can now be found on [email or web address] ([email or web address]).  Listed under [place].

## 2018-10-18 NOTE — ED Notes (Signed)
Paged admitting per RN  

## 2018-10-19 ENCOUNTER — Inpatient Hospital Stay (HOSPITAL_COMMUNITY): Payer: Medicare Other

## 2018-10-19 ENCOUNTER — Encounter (HOSPITAL_COMMUNITY): Payer: Self-pay | Admitting: *Deleted

## 2018-10-19 DIAGNOSIS — A419 Sepsis, unspecified organism: Principal | ICD-10-CM

## 2018-10-19 DIAGNOSIS — I361 Nonrheumatic tricuspid (valve) insufficiency: Secondary | ICD-10-CM

## 2018-10-19 DIAGNOSIS — Z720 Tobacco use: Secondary | ICD-10-CM

## 2018-10-19 DIAGNOSIS — J189 Pneumonia, unspecified organism: Secondary | ICD-10-CM

## 2018-10-19 DIAGNOSIS — J449 Chronic obstructive pulmonary disease, unspecified: Secondary | ICD-10-CM

## 2018-10-19 DIAGNOSIS — I340 Nonrheumatic mitral (valve) insufficiency: Secondary | ICD-10-CM

## 2018-10-19 LAB — MAGNESIUM: Magnesium: 2 mg/dL (ref 1.7–2.4)

## 2018-10-19 LAB — CBC WITH DIFFERENTIAL/PLATELET
Abs Immature Granulocytes: 0.15 10*3/uL — ABNORMAL HIGH (ref 0.00–0.07)
Basophils Absolute: 0 10*3/uL (ref 0.0–0.1)
Basophils Relative: 0 %
Eosinophils Absolute: 0 10*3/uL (ref 0.0–0.5)
Eosinophils Relative: 0 %
HCT: 32.6 % — ABNORMAL LOW (ref 36.0–46.0)
Hemoglobin: 10.5 g/dL — ABNORMAL LOW (ref 12.0–15.0)
Immature Granulocytes: 1 %
Lymphocytes Relative: 4 %
Lymphs Abs: 0.8 10*3/uL (ref 0.7–4.0)
MCH: 28.9 pg (ref 26.0–34.0)
MCHC: 32.2 g/dL (ref 30.0–36.0)
MCV: 89.8 fL (ref 80.0–100.0)
Monocytes Absolute: 0.9 10*3/uL (ref 0.1–1.0)
Monocytes Relative: 4 %
Neutro Abs: 20.1 10*3/uL — ABNORMAL HIGH (ref 1.7–7.7)
Neutrophils Relative %: 91 %
Platelets: 565 10*3/uL — ABNORMAL HIGH (ref 150–400)
RBC: 3.63 MIL/uL — ABNORMAL LOW (ref 3.87–5.11)
RDW: 16.7 % — ABNORMAL HIGH (ref 11.5–15.5)
WBC: 21.9 10*3/uL — ABNORMAL HIGH (ref 4.0–10.5)
nRBC: 0 % (ref 0.0–0.2)

## 2018-10-19 LAB — COMPREHENSIVE METABOLIC PANEL
ALT: 36 U/L (ref 0–44)
AST: 33 U/L (ref 15–41)
Albumin: 2.7 g/dL — ABNORMAL LOW (ref 3.5–5.0)
Alkaline Phosphatase: 74 U/L (ref 38–126)
Anion gap: 16 — ABNORMAL HIGH (ref 5–15)
BUN: 31 mg/dL — ABNORMAL HIGH (ref 8–23)
CO2: 24 mmol/L (ref 22–32)
Calcium: 9.1 mg/dL (ref 8.9–10.3)
Chloride: 95 mmol/L — ABNORMAL LOW (ref 98–111)
Creatinine, Ser: 1.09 mg/dL — ABNORMAL HIGH (ref 0.44–1.00)
GFR calc Af Amer: 55 mL/min — ABNORMAL LOW (ref >60)
GFR calc non Af Amer: 47 mL/min — ABNORMAL LOW (ref >60)
Glucose, Bld: 222 mg/dL — ABNORMAL HIGH (ref 70–99)
Potassium: 4.2 mmol/L (ref 3.5–5.1)
Sodium: 135 mmol/L (ref 135–145)
Total Bilirubin: 0.6 mg/dL (ref 0.3–1.2)
Total Protein: 5.8 g/dL — ABNORMAL LOW (ref 6.5–8.1)

## 2018-10-19 LAB — GLUCOSE, CAPILLARY
Glucose-Capillary: 228 mg/dL — ABNORMAL HIGH (ref 70–99)
Glucose-Capillary: 248 mg/dL — ABNORMAL HIGH (ref 70–99)
Glucose-Capillary: 166 mg/dL — ABNORMAL HIGH (ref 70–99)
Glucose-Capillary: 135 mg/dL — ABNORMAL HIGH (ref 70–99)

## 2018-10-19 LAB — ECHOCARDIOGRAM COMPLETE
Height: 61 in
Weight: 1802.48 oz

## 2018-10-19 LAB — PROCALCITONIN: Procalcitonin: 13.37 ng/mL

## 2018-10-19 LAB — HIV ANTIBODY (ROUTINE TESTING W REFLEX): HIV Screen 4th Generation wRfx: NONREACTIVE

## 2018-10-19 MED ORDER — RAMELTEON 8 MG PO TABS
8.0000 mg | ORAL_TABLET | Freq: Every day | ORAL | Status: DC
  Administered 2018-10-19 – 2018-10-23 (×4): 8 mg via ORAL
  Filled 2018-10-19 (×9): qty 1

## 2018-10-19 MED ORDER — LEVALBUTEROL HCL 1.25 MG/0.5ML IN NEBU
1.2500 mg | INHALATION_SOLUTION | Freq: Two times a day (BID) | RESPIRATORY_TRACT | Status: DC
  Administered 2018-10-20 (×2): 1.25 mg via RESPIRATORY_TRACT
  Filled 2018-10-19 (×4): qty 0.5

## 2018-10-19 MED ORDER — FUROSEMIDE 10 MG/ML IJ SOLN
40.0000 mg | Freq: Two times a day (BID) | INTRAMUSCULAR | Status: DC
  Administered 2018-10-19: 40 mg via INTRAVENOUS
  Filled 2018-10-19: qty 4

## 2018-10-19 MED ORDER — METHYLPREDNISOLONE SODIUM SUCC 40 MG IJ SOLR
40.0000 mg | Freq: Once | INTRAMUSCULAR | Status: AC
  Administered 2018-10-19: 40 mg via INTRAVENOUS
  Filled 2018-10-19: qty 1

## 2018-10-19 MED ORDER — LEVALBUTEROL HCL 1.25 MG/0.5ML IN NEBU: 1.2500 mg | INHALATION_SOLUTION | Freq: Four times a day (QID) | RESPIRATORY_TRACT | Status: DC | PRN

## 2018-10-19 MED ORDER — SODIUM CHLORIDE 0.9 % IV SOLN
500.0000 mg | INTRAVENOUS | Status: DC
  Administered 2018-10-19 – 2018-10-23 (×5): 500 mg via INTRAVENOUS
  Filled 2018-10-19 (×6): qty 500

## 2018-10-19 MED ORDER — PREDNISONE 20 MG PO TABS
40.0000 mg | ORAL_TABLET | Freq: Every day | ORAL | Status: DC
  Administered 2018-10-20: 10 mg via ORAL
  Administered 2018-10-21 – 2018-10-23 (×3): 40 mg via ORAL
  Filled 2018-10-19 (×4): qty 2

## 2018-10-19 MED ORDER — LEVALBUTEROL HCL 1.25 MG/0.5ML IN NEBU
1.2500 mg | INHALATION_SOLUTION | Freq: Four times a day (QID) | RESPIRATORY_TRACT | Status: DC
  Administered 2018-10-19 (×3): 1.25 mg via RESPIRATORY_TRACT
  Filled 2018-10-19 (×3): qty 0.5

## 2018-10-19 MED ORDER — LEVALBUTEROL HCL 1.25 MG/0.5ML IN NEBU
INHALATION_SOLUTION | RESPIRATORY_TRACT | Status: AC
  Administered 2018-10-19: 1.25 mg via RESPIRATORY_TRACT
  Filled 2018-10-19: qty 0.5

## 2018-10-19 NOTE — Progress Notes (Signed)
Paged for heart rate 130-140 and  patient reported she thinks she is experiencing panic attack. She has never experienced this before.  Patient examined at bedside. Her mentation is normal and heart rate 105-125 at this time. Patient says heart rate went up after Duonebs at 3am. Heart rate is gradual going back down and she is feeling more normal. She says she is a normal healthy 84 year old and would like to feel better. She reports refusing dose of lasix today for because it will deplete her electrolytes. Patient encouraged to take lasix and told we will be monitoring her electrolytes. She said she would like to go to sleep and would rather not take the lasix. She says she is tired but can't get to sleep. At home she has a glass of milk sometimes to help.   Gen: No acute distress, lying in bed, appears frail Heart: Tachycardic, regular rhythm. No MRG Pulmonary: inspiratory and expiratory wheezing  Patient tachycardic after duonebs, possible side effect of medication. Patient feels back to normal at the end of exam. HR  105-115 - [place] for sleep

## 2018-10-19 NOTE — Progress Notes (Addendum)
Subjective: Patient evaluated on rounds this morning. Patient was admitted yesterday with respiratory distress requiring BiPAP. CXR showed Later in the day, seh was weaned off BiPAP and doing well.   Objective:  Vital signs in last 24 hours: Vitals:   10/19/18 0645 10/19/18 0646 10/19/18 0714 10/19/18 1114  BP:   (!) 118/58 120/80  Pulse: (!) 118 (!) 117 (!) 106 (!) 114  Resp: (!) 24 20 (!) 32 (!) 28  Temp:   97.6 F (36.4 C)   TempSrc:   Axillary   SpO2: 100% 100% 100% 100%  Weight:      Height:       Physical Exam Constitutional:      Appearance: She is ill-appearing.  Cardiovascular:     Rate and Rhythm: Regular rhythm. Tachycardia present.     Pulses: Normal pulses.     Heart sounds: Normal heart sounds. No murmur. No friction rub. No gallop.   Pulmonary:     Effort: Tachypnea present.     Breath sounds: Wheezing and rhonchi present.     Comments: Expiratory wheezing and rhonchi noted in all lung fields   Chest:     Chest wall: No tenderness.  Abdominal:     General: Bowel sounds are normal.     Palpations: Abdomen is soft.     Tenderness: There is no abdominal tenderness. There is no guarding.  Skin:    General: Skin is warm and dry.  Neurological:     General: No focal deficit present.     Mental Status: She is alert and oriented to person, place, and time.     Cranial Nerves: No cranial nerve deficit.     Motor: No weakness.     Assessment/Plan:  84 yo female with PMHx of COPD, iron deficiency anemia, and borderline DM and polyarthropathy concerning for PMR presenting with sudden onset shortness of breath for  requiring NIPPV. ABG with respiratory acidosis and CXR consistent with pneumonia.   Sepsis secondary to Community Acquired Pneumonia:  Patient with history of COPD with sudden onset shortness of breath requiring BiPAP. Patient presented with respiratory distress with hypoxia on arrival, placed on BiPAP with improvement in symptoms. However, she  decompensated overnight and required BiPAP with IPAP 14, EPAP 5. Patient remains afebrile but is tachycardic and tachypnic. WBC elevated at 21.9 (neutrophil predominant) after treatment with vancomycin and cefepime. Procalcitonin 13.37. CXR with worsening right sided pneumonia. Added azithromycin for further coverage of CAP organisms. Blood cultures pending   - Cefepime 2g IV q12h and Azithromycin '500mg'$  IV qd - Continue to monitor CBC  - Continue to monitor vitals  Acute hypoxic and hypercarbic respiratory failure:  Patient with history of COPD and chronic tobacco use with sudden onset dyspnea requiring BiPAP due to respiratory distress and hypoxia with ABG: pH 7.214; pCO2 63.7; HCO3 25.3, pO2 100. Patient also has elevated BNP, JVP on examination and cardiomegaly on chest x-ray. Echo pending for suspected CHF.Patient reevaluated and noted to have severely reduced EF, biatrial enlargement, septal and apical hypokinesis. Patient receiving Lasix '40mg'$  bid GOC were discussed with patient and family. Patient would like trial of HFNC at this time and would like to go back on BiPAP if necessary at this time but would also consider comfort care measures.   - Continue duoneb 90m neb q4h - Continue IV furosemide '40mg'$  bid for diuresis  - Continue to monitor respiratory status   Iron deficiency anemia:  Patient with chronic anemia, previously thought to be  anemia of chronic disease but recent iron studies in April 2020 consistent with iron deficiency anemia (Fe: 10, TIBC 177, %Sat: 6, Ferritin 309). She has had two sessions of Injectafer in June 2020. Found to be  FOBT +. Previous colonoscopy in 2011 was normal. However, patient has had 15lb unintentional weight loss and fatigue over past few months. On chart review, patient seen by GI and does not want to undergo further evaluation for colonic neoplasm at this time.  She denies any GI symptoms at this time. Hb 10.5 this AM.   - Continue to monitor   Polyarthropathy:  Patient has had trouble standing from sitting position for few months with intermittent rash. She was noted to have elevated WBCs, ESR and MPO on previous admissions and referred to rheumatology. She was diagnosed with polyarthropathy with suspicion of polymyalgia rheumatica. She was started on low dose prednisone with resulting improvement in symptoms.  - Continue prednisone '40mg'$  qd - Continue to monitor   Type II DM:  Patient with Hx of Type 2 DM with recent hyperglycemia due to prednisone.  - SSI  - CBG monitoring   Diet: Heart Healthy  DVT Prophylaxis: Lovenox  Code:  DNR/DNI  Dispo: Anticipated discharge pending further evaluation.    Harvie Heck, MD  Internal Medicine PGY1 10/19/2018, 3:11 PM Pager: [phone]

## 2018-10-19 NOTE — Progress Notes (Signed)
Went to check on Ms. Corinna Capra, daughter is at bedside.  Patient feels like she is not breathing any better.  On examination breathing still labored with patient on BiPAP IPAP 14 EPAP 5.  Lung sounds are coarse rhonchi worsened from earlier exam.  She remains tachycardic, no lower extremity edema, difficult to asess JVP due to patient being on BiPAP.  Sonographer at bedside, reviewed several different views parasternal long axis, four-chamber apical view, parasternal short axis.  Severely reduced EF, biatrial enlargement, septal and apical hypokinesis.  Moderate to severe RV dysfunction as well.  Discussed overall clinical trajectory with the patient and her daughter.  She feels she is getting weaker she would like to be able to eat and drink.  Discussed the current interventions we are doing that we still think this is primarily a pulmonary process.  I explained that currently I do not feel she will do well off the BiPAP but that we can try high flow nasal cannula in an attempt to allow her to eat and drink and spend some time with her family.  I prepared her that it may not be enough pulmonary support for her, she stated that if it is not at this time they would still like to go back on the BiPAP but that they would consider comfort care measures as an option afterwards if or when she failed high flow nasal cannula.    -Continue broad-spectrum antibiotics -Continue IV Lasix -Continue COPD treatment with duo nebs, steroids -Attempt to transition patient to high flow nasal cannula, if not enough to support her, will need to transition pt back to bipap at that time family will consider comfort care measures as an option

## 2018-10-19 NOTE — Progress Notes (Signed)
Pt respiratory status stable on BIPAP at this time. Pt would like to eat at this time. Pt placed on HFNC salter while eating. RT will continue to monitor.

## 2018-10-19 NOTE — Progress Notes (Addendum)
  Echocardiogram 2D Echocardiogram has been performed.  Patient not wanting imaging enhancing agents.  Linda Ray 10/19/2018, 3:02 PM

## 2018-10-19 NOTE — Progress Notes (Signed)
RT note: RT placed patient on HFNC at 6L per MD and patients request for rest. RT called back to patients room within 10 minutes for increased WOB and patient requesting to be placed back on BIPAP. RN aware.

## 2018-10-19 NOTE — Progress Notes (Addendum)
  Date: 10/19/2018  Patient name: Linda Ray  Medical record number: [id number]  Date of birth: 1934/11/11   I have seen and evaluated this patient and I have discussed the plan of care with the house staff. Please see their note for complete details. I concur with their findings with the following additions/corrections:   Significant respiratory distress overnight, required going back on BiPAP.  Repeat CXR looks much worse, bilateral diffuse airspace disease.  On exam, she has significant inspiratory and expiratory wheezing and accessory muscle use.  TTE today shows reduced EF 30 to 35%, but primary process still appears to be pneumonia, possibly with a component of ARDS.  Procalcitonin is up to 13.  Leukocytosis has improved from 35-22. -Continue bronchodilators -Continue steroids -Continue vancomycin, cefepime, add azithromycin -Continue diuresis, trying to keep lungs as dry as possible -Continue BiPAP as needed and tolerated for respiratory support -Pilar Plate discussion of goals of care today, she would like to continue pursuing the treatment we are doing, but is quite clear that if she begins to decline and is no longer able to tolerate the needed respiratory support, that she would like to transition to comfort care at that time.  Unfortunately, her prognosis is guarded at best at this time.  Lenice Pressman, M.D., Ph.D. 10/19/2018, 2:31 PM

## 2018-10-19 NOTE — Progress Notes (Signed)
Paged for heart rate to 140's. Patient examined at bedside and having difficult breathing. States she would like the lasix now. Sat 88% on 5 L [place] and very uncomfortable. Sat patient up in bed and saturation stays 88-90%.  Patient tachycardic to 140. - Patient given 40 Lasix  - Start on Bipap - 1 dose Solumedrol -  Q6 Xopenex - EKG - Chest Xray  Tamsen Snider, MD PGY1  [phone]

## 2018-10-20 DIAGNOSIS — I5020 Unspecified systolic (congestive) heart failure: Secondary | ICD-10-CM

## 2018-10-20 DIAGNOSIS — E440 Moderate protein-calorie malnutrition: Secondary | ICD-10-CM | POA: Diagnosis present

## 2018-10-20 LAB — COMPREHENSIVE METABOLIC PANEL
ALT: 38 U/L (ref 0–44)
AST: 29 U/L (ref 15–41)
Albumin: 2.8 g/dL — ABNORMAL LOW (ref 3.5–5.0)
Alkaline Phosphatase: 68 U/L (ref 38–126)
Anion gap: 14 (ref 5–15)
BUN: 39 mg/dL — ABNORMAL HIGH (ref 8–23)
CO2: 27 mmol/L (ref 22–32)
Calcium: 9.1 mg/dL (ref 8.9–10.3)
Chloride: 95 mmol/L — ABNORMAL LOW (ref 98–111)
Creatinine, Ser: 1.31 mg/dL — ABNORMAL HIGH (ref 0.44–1.00)
GFR calc Af Amer: 44 mL/min — ABNORMAL LOW (ref >60)
GFR calc non Af Amer: 38 mL/min — ABNORMAL LOW (ref >60)
Glucose, Bld: 157 mg/dL — ABNORMAL HIGH (ref 70–99)
Potassium: 4 mmol/L (ref 3.5–5.1)
Sodium: 136 mmol/L (ref 135–145)
Total Bilirubin: 0.7 mg/dL (ref 0.3–1.2)
Total Protein: 6 g/dL — ABNORMAL LOW (ref 6.5–8.1)

## 2018-10-20 LAB — CBC WITH DIFFERENTIAL/PLATELET
Abs Immature Granulocytes: 0.1 10*3/uL — ABNORMAL HIGH (ref 0.00–0.07)
Basophils Absolute: 0 10*3/uL (ref 0.0–0.1)
Basophils Relative: 0 %
Eosinophils Absolute: 0 10*3/uL (ref 0.0–0.5)
Eosinophils Relative: 0 %
HCT: 32.9 % — ABNORMAL LOW (ref 36.0–46.0)
Hemoglobin: 10.8 g/dL — ABNORMAL LOW (ref 12.0–15.0)
Immature Granulocytes: 1 %
Lymphocytes Relative: 6 %
Lymphs Abs: 1 10*3/uL (ref 0.7–4.0)
MCH: 28.7 pg (ref 26.0–34.0)
MCHC: 32.8 g/dL (ref 30.0–36.0)
MCV: 87.5 fL (ref 80.0–100.0)
Monocytes Absolute: 1.3 10*3/uL — ABNORMAL HIGH (ref 0.1–1.0)
Monocytes Relative: 8 %
Neutro Abs: 14.6 10*3/uL — ABNORMAL HIGH (ref 1.7–7.7)
Neutrophils Relative %: 85 %
Platelets: 582 10*3/uL — ABNORMAL HIGH (ref 150–400)
RBC: 3.76 MIL/uL — ABNORMAL LOW (ref 3.87–5.11)
RDW: 16.8 % — ABNORMAL HIGH (ref 11.5–15.5)
WBC: 17.1 10*3/uL — ABNORMAL HIGH (ref 4.0–10.5)
nRBC: 0 % (ref 0.0–0.2)

## 2018-10-20 LAB — GLUCOSE, CAPILLARY
Glucose-Capillary: 220 mg/dL — ABNORMAL HIGH (ref 70–99)
Glucose-Capillary: 153 mg/dL — ABNORMAL HIGH (ref 70–99)
Glucose-Capillary: 114 mg/dL — ABNORMAL HIGH (ref 70–99)
Glucose-Capillary: 172 mg/dL — ABNORMAL HIGH (ref 70–99)
Glucose-Capillary: 182 mg/dL — ABNORMAL HIGH (ref 70–99)
Glucose-Capillary: 123 mg/dL — ABNORMAL HIGH (ref 70–99)

## 2018-10-20 LAB — PROCALCITONIN: Procalcitonin: 10.16 ng/mL

## 2018-10-20 MED ORDER — ADULT MULTIVITAMIN W/MINERALS CH
1.0000 | ORAL_TABLET | Freq: Every day | ORAL | Status: DC
  Administered 2018-10-21 – 2018-10-26 (×5): 1 via ORAL
  Filled 2018-10-20 (×7): qty 1

## 2018-10-20 MED ORDER — SODIUM CHLORIDE 0.9 % IV SOLN
2.0000 g | INTRAVENOUS | Status: DC
  Administered 2018-10-21: 2 g via INTRAVENOUS
  Filled 2018-10-20: qty 2

## 2018-10-20 MED ORDER — PREDNISONE 20 MG PO TABS
30.0000 mg | ORAL_TABLET | Freq: Once | ORAL | Status: DC
  Filled 2018-10-20: qty 1

## 2018-10-20 MED ORDER — ENOXAPARIN SODIUM 30 MG/0.3ML ~~LOC~~ SOLN
30.0000 mg | SUBCUTANEOUS | Status: DC
  Filled 2018-10-20: qty 0.3

## 2018-10-20 NOTE — Progress Notes (Signed)
Initial Nutrition Assessment  DOCUMENTATION CODES:   Non-severe (moderate) malnutrition in context of chronic illness  INTERVENTION:   -Ensure Enlive po BID, each supplement provides 350 kcal and 20 grams of protein -Magic cup TID with meals, each supplement provides 290 kcal and 9 grams of protein -MVI with minerals daily  NUTRITION DIAGNOSIS:   Moderate Malnutrition related to chronic illness(COPD) as evidenced by mild fat depletion, moderate fat depletion, mild muscle depletion, moderate muscle depletion, percent weight loss.  GOAL:   Patient will meet greater than or equal to 90% of their needs  MONITOR:   PO intake, Supplement acceptance, Labs, Weight trends, Skin, I & O's  REASON FOR ASSESSMENT:   Consult, Malnutrition Screening Tool Assessment of nutrition requirement/status  ASSESSMENT:   84 y.o. yo female w/ PMH significant for  Breast cancer,COPD, iron deficiency anemia, steroid induced DM, and chronic back pain presents with shortness of breath since the previous night. She reports that the shortness of breath was sudden in onset and that she tried using 2 puffs of her Provair inhaler without any relief. Due to the continued dyspnea, the patient called EMS and was given more albuterol in addition to solumedrol and magnesium, placed on oxygen via nonrebreather and brought to ED. Patient had a similar episode 2 days ago that was relieved with 2 puffs of albuterol. Of note, she was seen by PCP yesterday and per daughter, chest x-ray at PCP office showed initial formation of pneumonia but no antibiotics were given. She denies chest pain, headaches, dizziness, recent sick contacts, fever/chills.  Pt admitted with acute respiratory failure with concern for COPD exacerbation.   Reviewed I/O's: -1.2 L x 24 hours  UOP: 1.6 L x 24 hours   Spoke with pt, who was a bit irritable at time of visit (frustrated that there was no comb available for her to brush her hair). Pt reports  that she has a good appetite and generally "grazes" throughout the day ( 3 meals and several snacks). Pt shares she does not eat breakfast routinely, but consumed some milk this AM.   Pt reports a general decline in health over the past few months "due to all that has been going on". She states "I no longer have any muscles". Noted that pt has experienced a 15.5% wt loss over the past 4 months, which is significant for time frame.   Medications reviewed and include prednisone.   Lab Results  Component Value Date   HGBA1C 8.0 (H) 06/11/2018   PTA DM medications are 4 mg glimepiride daily. Per ADA's Standards of Medical Care of Diabetes, glycemic targets for older adults who have multiple co-morbidities, cognitive impairments, and functional dependence should be less stringent (Hgb A1c <8.0-8.5).   Labs reviewed: CBGS: 135-220 (inpatient orders for glycemic control are 0-15 units insulin aspart TID with meals and 0-5 units insulin aspart q HS).   NUTRITION - FOCUSED PHYSICAL EXAM:    Most Recent Value  Orbital Region  Mild depletion  Upper Arm Region  Moderate depletion  Thoracic and Lumbar Region  Mild depletion  Buccal Region  Mild depletion  Temple Region  Mild depletion  Clavicle Bone Region  Moderate depletion  Clavicle and Acromion Bone Region  Moderate depletion  Scapular Bone Region  Moderate depletion  Dorsal Hand  Moderate depletion  Patellar Region  Mild depletion  Anterior Thigh Region  Mild depletion  Posterior Calf Region  Mild depletion  Edema (RD Assessment)  Mild  Hair  Reviewed  Eyes  Reviewed  Mouth  Reviewed  Skin  Reviewed  Nails  Reviewed       Diet Order:   Diet Order            Diet heart healthy/carb modified Room service appropriate? Yes; Fluid consistency: Thin  Diet effective now              EDUCATION NEEDS:   No education needs have been identified at this time  Skin:  Skin Assessment: Reviewed RN Assessment  Last BM:   Unknown  Height:   Ht Readings from Last 1 Encounters:  10/18/18 5\' 1"  (1.549 m)    Weight:   Wt Readings from Last 1 Encounters:  10/19/18 51.1 kg    Ideal Body Weight:  47.7 kg  BMI:  Body mass index is 21.29 kg/m.  Estimated Nutritional Needs:   Kcal:  1500-1700  Protein:  65-80 grams  Fluid:  > 1.5 L    Serra Younan A. Jimmye Norman, RD, LDN, [place] Registered Dietitian II Certified Diabetes Care and Education Specialist Pager: [phone] After hours Pager: [phone]

## 2018-10-20 NOTE — Progress Notes (Signed)
Patient stated that she will not be needed the BIPAP this evening, informed patient that if she changes her mind to let her nurse know to call for Respiratory. Will continue to monitor patient.

## 2018-10-20 NOTE — Progress Notes (Signed)
  Date: 10/20/2018  Patient name: Linda Ray  Medical record number: [id number]  Date of birth: [birth year]   I have seen and evaluated this patient and I have discussed the plan of care with the house staff. Please see their note for complete details. I concur with their findings with the following additions/corrections:   Continued to have significant respiratory distress overnight, switched to HFNC for comfort. Had some episodes of feeling anxious, likely related to hypercarbia. Afebrile, WBC improving, procalcitonin down to 10.   Will continue IV cefepime and azithromycin, steroids, bronchodilators for bilateral pneumonia and accompanying COPD exacerbation.   Will start low dose benzo for anxiety, though suspect it is primarily driven by inadequate ventilation. If she declines, will need to consider transition to comfort care. She is opposed to morphine, will discuss using alternate opioids for air hunger should the need arise.  Lenice Pressman, M.D., Ph.D. 10/20/2018, 1:34 PM

## 2018-10-20 NOTE — Progress Notes (Signed)
Subjective: Patient evaluated at bedside this morning. She continued to have respiratory distress overnight requiring BiPAP and was switched to  HFNC 8L with 100% saturation. She continues to have increased work of breathing. Nurse reported episodes of anxiety which were likely related hypercarbia. Patient voices no acute concerns at this point and is in full understanding of her situation.   Objective:  Vital signs in last 24 hours: Vitals:   10/20/18 0315 10/20/18 0710 10/20/18 0745 10/20/18 0806  BP: 130/76     Pulse: (!) 117 (!) 120    Resp: (!) 25 19    Temp: (!) 97.5 F (36.4 C)   97.6 F (36.4 C)  TempSrc: Axillary   Oral  SpO2: 100% 100% 100%   Weight:      Height:       Physical Exam Constitutional:      Appearance: She is ill-appearing.  Cardiovascular:     Rate and Rhythm: Regular rhythm. Tachycardia present.     Pulses: Normal pulses.     Heart sounds: No murmur. No friction rub. No gallop.      Comments: Bilateral lower extremity edema improved Mild JVD present  Pulmonary:     Effort: Tachypnea and respiratory distress present.     Breath sounds: Wheezing present.     Comments: Inspiratory and expiratory wheezing in all lung fields  Chest:     Chest wall: No tenderness.  Abdominal:     General: Bowel sounds are normal.     Palpations: Abdomen is soft.     Tenderness: There is no abdominal tenderness.  Neurological:     General: No focal deficit present.     Mental Status: She is alert and oriented to person, place, and time.      Assessment/Plan:  84 yo female with PMHx of COPD, iron deficiency anemia, and borderline DMand polyarthropathy concerning for PMR presenting with sudden onset shortness of breathfor  requiring NIPPV. ABG with respiratory acidosis and CXR consistent with pneumonia.  Sepsissecondary to [employer] AcquiredPneumonia: Patient with history of COPD with sudden onset shortness of breath requiring BiPAP.Patient presented with  respiratory distress with hypoxia on arrival, placed on BiPAP with improvement in symptoms. However, she decompensated overnight and required BiPAP. Patient remains afebrile but is tachycardic and tachypnic. WBC and procalcitonin trending down with cefepime and azithromycin. Blood cultures negative to date.   - Cefepime 2g IV q12h and Azithromycin 534m IV qd - Continue to monitor CBC and procal   - Continue to monitor vitals  Acute hypoxic and hypercarbic respiratory failure: Patient with history of COPD and chronic tobacco use with sudden onset dyspnea requiring BiPAP due to respiratory distress and hypoxia withABG:pH7.214; pCO263.7;HCO3 25.3, pO2100. Patient continued to have respiratory distress overnight requiring BiPAP but was switched to HFNC 8L with 100% SpO2. But continues to have increased work of breathing.   - Continue duoneb 376mneb q4h - Prednisone 4069md - IV lasix 36m69m  - Continue to monitor respiratory status  Heart failure with reduced ejection fraction:  Patient with elevated BNP, JVP on examination and cardiomegaly on examination. Echo with EF 30-35% with LV apical and anteroseptal akinesis and atrial enlargement. Patient received Lasix 36mg7m. This morning, patient has mild JVP on examination. Will decrease lasix to 36mg 1m a day.   - Lasix 36mg q70mIron deficiency anemia:  Patient with chronic anemia, previously thought to be anemia of chronic disease but recent iron studies in April 2020 consistent with  iron deficiency anemia (Fe: 10, TIBC 177, %Sat: 6, Ferritin 309). She has had two sessions of Injectafer in June 2020. Found to be FOBT +. Previous colonoscopy in 2011 was normal. However, patient has had 15lb unintentional weight loss and fatigue over past few months. On chart review, patient seen by GI and does not want to undergo further evaluation for colonic neoplasm at this time. She denies any GI symptoms at this time. Hb 10.8 this AM.   -  Continue to monitor  Polyarthropathy:  Patient has had trouble standing from sitting position for few months with intermittent rash. She was noted to have elevated WBCs, ESR and MPO on previous admissions and referred to rheumatology. She was diagnosed with polyarthropathy with suspicion of polymyalgia rheumatica. She was started on low dose prednisone with resulting improvement in symptoms.  - Continue prednisone28m qd - Continue to monitor   Type II DM: Patient with Hx of Type 2 DM with recent hyperglycemia due to prednisone.  - SSI  - CBG monitoring  Diet:Heart Healthy DVT Prophylaxis:Lovenox Code: DNR/DNI  Dispo: Anticipated discharge in pending in 2-3 day(s).   AHarvie Heck MD  Internal Medicine, PGY-1 10/20/2018, 10:13 AM Pager: [phone]

## 2018-10-21 DIAGNOSIS — Z7952 Long term (current) use of systemic steroids: Secondary | ICD-10-CM

## 2018-10-21 LAB — CBC WITH DIFFERENTIAL/PLATELET
Abs Immature Granulocytes: 0.09 10*3/uL — ABNORMAL HIGH (ref 0.00–0.07)
Basophils Absolute: 0 10*3/uL (ref 0.0–0.1)
Basophils Relative: 0 %
Eosinophils Absolute: 0 10*3/uL (ref 0.0–0.5)
Eosinophils Relative: 0 %
HCT: 35.1 % — ABNORMAL LOW (ref 36.0–46.0)
Hemoglobin: 11 g/dL — ABNORMAL LOW (ref 12.0–15.0)
Immature Granulocytes: 1 %
Lymphocytes Relative: 13 %
Lymphs Abs: 2.2 10*3/uL (ref 0.7–4.0)
MCH: 28.1 pg (ref 26.0–34.0)
MCHC: 31.3 g/dL (ref 30.0–36.0)
MCV: 89.5 fL (ref 80.0–100.0)
Monocytes Absolute: 1.3 10*3/uL — ABNORMAL HIGH (ref 0.1–1.0)
Monocytes Relative: 7 %
Neutro Abs: 13.9 10*3/uL — ABNORMAL HIGH (ref 1.7–7.7)
Neutrophils Relative %: 79 %
Platelets: 577 10*3/uL — ABNORMAL HIGH (ref 150–400)
RBC: 3.92 MIL/uL (ref 3.87–5.11)
RDW: 16.8 % — ABNORMAL HIGH (ref 11.5–15.5)
WBC: 17.4 10*3/uL — ABNORMAL HIGH (ref 4.0–10.5)
nRBC: 0 % (ref 0.0–0.2)

## 2018-10-21 LAB — GLUCOSE, CAPILLARY
Glucose-Capillary: 101 mg/dL — ABNORMAL HIGH (ref 70–99)
Glucose-Capillary: 191 mg/dL — ABNORMAL HIGH (ref 70–99)
Glucose-Capillary: 244 mg/dL — ABNORMAL HIGH (ref 70–99)
Glucose-Capillary: 115 mg/dL — ABNORMAL HIGH (ref 70–99)

## 2018-10-21 LAB — COMPREHENSIVE METABOLIC PANEL
ALT: 41 U/L (ref 0–44)
AST: 30 U/L (ref 15–41)
Albumin: 2.8 g/dL — ABNORMAL LOW (ref 3.5–5.0)
Alkaline Phosphatase: 90 U/L (ref 38–126)
Anion gap: 11 (ref 5–15)
BUN: 42 mg/dL — ABNORMAL HIGH (ref 8–23)
CO2: 28 mmol/L (ref 22–32)
Calcium: 9.4 mg/dL (ref 8.9–10.3)
Chloride: 98 mmol/L (ref 98–111)
Creatinine, Ser: 1.06 mg/dL — ABNORMAL HIGH (ref 0.44–1.00)
GFR calc Af Amer: 57 mL/min — ABNORMAL LOW (ref >60)
GFR calc non Af Amer: 49 mL/min — ABNORMAL LOW (ref >60)
Glucose, Bld: 120 mg/dL — ABNORMAL HIGH (ref 70–99)
Potassium: 4.7 mmol/L (ref 3.5–5.1)
Sodium: 137 mmol/L (ref 135–145)
Total Bilirubin: 0.7 mg/dL (ref 0.3–1.2)
Total Protein: 5.8 g/dL — ABNORMAL LOW (ref 6.5–8.1)

## 2018-10-21 LAB — PROCALCITONIN: Procalcitonin: 3.93 ng/mL

## 2018-10-21 MED ORDER — LEVALBUTEROL HCL 1.25 MG/0.5ML IN NEBU
1.2500 mg | INHALATION_SOLUTION | Freq: Four times a day (QID) | RESPIRATORY_TRACT | Status: DC
  Filled 2018-10-21 (×3): qty 0.5

## 2018-10-21 MED ORDER — SODIUM CHLORIDE 0.9 % IV SOLN
2.0000 g | Freq: Two times a day (BID) | INTRAVENOUS | Status: AC
  Administered 2018-10-21 – 2018-10-24 (×7): 2 g via INTRAVENOUS
  Filled 2018-10-21 (×7): qty 2

## 2018-10-21 MED ORDER — FUROSEMIDE 10 MG/ML IJ SOLN
40.0000 mg | Freq: Two times a day (BID) | INTRAMUSCULAR | Status: DC
  Administered 2018-10-21 – 2018-10-22 (×3): 40 mg via INTRAVENOUS
  Filled 2018-10-21 (×3): qty 4

## 2018-10-21 MED ORDER — LEVALBUTEROL HCL 1.25 MG/0.5ML IN NEBU
1.2500 mg | INHALATION_SOLUTION | Freq: Once | RESPIRATORY_TRACT | Status: AC
  Administered 2018-10-21: 1.25 mg via RESPIRATORY_TRACT

## 2018-10-21 MED ORDER — LORAZEPAM 2 MG/ML IJ SOLN
0.5000 mg | Freq: Once | INTRAMUSCULAR | Status: AC
  Administered 2018-10-21: 0.5 mg via INTRAVENOUS
  Filled 2018-10-21: qty 1

## 2018-10-21 MED ORDER — ENOXAPARIN SODIUM 40 MG/0.4ML ~~LOC~~ SOLN
40.0000 mg | SUBCUTANEOUS | Status: DC
  Filled 2018-10-21: qty 0.4

## 2018-10-21 MED ORDER — LEVALBUTEROL HCL 1.25 MG/0.5ML IN NEBU
1.2500 mg | INHALATION_SOLUTION | Freq: Three times a day (TID) | RESPIRATORY_TRACT | Status: DC
  Filled 2018-10-21 (×2): qty 0.5

## 2018-10-21 MED ORDER — LEVALBUTEROL HCL 1.25 MG/0.5ML IN NEBU
1.2500 mg | INHALATION_SOLUTION | Freq: Four times a day (QID) | RESPIRATORY_TRACT | Status: DC | PRN
  Administered 2018-10-21: 1.25 mg via RESPIRATORY_TRACT

## 2018-10-21 NOTE — Progress Notes (Addendum)
Subjective: Patient examined at bedside this morning. She is on BiPAP but wants to take it off. She was switched to HFNC by respiratory therapist while IM team in the room. She is tolerating it well. She is still "out of it" from the ativan last night but has no acute concerns. She has a productive cough this morning but reports its not worsening from baseline COPD.   Objective:  Vital signs in last 24 hours: Vitals:   10/21/18 0610 10/21/18 0707 10/21/18 0745 10/21/18 0747  BP: (!) 152/98   (!) 148/102  Pulse: (!) 119  (!) 134 (!) 139  Resp: (!) 21 20 (!) 31 18  Temp: 97.9 F (36.6 C)     TempSrc: Axillary     SpO2: 100%  99% 100%  Weight:      Height:       Physical Exam Cardiovascular:     Rate and Rhythm: Regular rhythm. Tachycardia present.     Heart sounds: No murmur. No friction rub. No gallop.      Comments: JVD present to madibular angle  Pulmonary:     Effort: Tachypnea present.     Comments: Expiratory wheezing in all lung fields, improved from previous day  Abdominal:     General: Bowel sounds are normal.     Palpations: Abdomen is soft.     Tenderness: There is no abdominal tenderness.  Neurological:     General: No focal deficit present.     Mental Status: She is alert and oriented to person, place, and time.     Motor: No weakness.      Assessment/Plan: 84 yo female with PMHx of COPD, iron deficiency anemia, and borderline DMand polyarthropathy concerning for PMRpresenting with sudden onset shortness of breathfor requiring NIPPV. ABG with respiratory acidosis and CXR consistent with pneumonia.  Sepsissecondary to [employer] AcquiredPneumonia: Patient with history of COPD with sudden onset shortness of breath requiring BiPAP.Patient presented with respiratory distress with hypoxia on arrival, placed on BiPAP with improvement in symptoms. However,she continues to require oxygen support with HFNC and BiPAP. Patient remains afebrile but is tachycardic  and tachypnic. WBC 17.4 (stable from yesterday) procalcitonin trending down with cefepime and azithromycin. Blood cultures negative to date.   - Continue Cefepime 2g IV q12hand Azithromycin '500mg'$  IV qd - Continue to monitor CBC  - Continue to monitor vitals  Acute hypoxic and hypercarbic respiratory failure: Patient with history of COPD and chronic tobacco use with sudden onset dyspnea requiring BiPAP due to respiratory distress and hypoxia withABG:pH7.214; pCO263.7;HCO3 25.3, pO2100. Patient continued to have respiratory distress overnight requiring BiPAP but was switched to HFNC 8L this morning and tolerating well. But continues to have increased work of breathing.   -Continue duoneb 88m neb q4h - Prednisone '40mg'$  qd - IV lasix '40mg'$  bid - Continue to monitor respiratory status  Heart failure with reduced ejection fraction:  Patient with elevated BNP, JVP on examination and cardiomegaly on examination. Echo with EF 30-35% with LV apical and anteroseptal akinesis and atrial enlargement. Patient received Lasix '40mg'$  bid. This morning, patient has JVD to mandibular angle on examination. Will resume lasix '40mg'$  bid  - Lasix '40mg'$  bid  Iron deficiency anemia:  Patient with chronic anemia, previously thought to be anemia of chronic disease but recent iron studies in April 2020 consistent with iron deficiency anemia (Fe: 10, TIBC 177, %Sat: 6, Ferritin 309). She has had two sessions of Injectafer in June 2020. Found to be FOBT +. Previous colonoscopy in  2011 was normal. However, patient has had 15lb unintentional weight loss and fatigue over past few months. On chart review, patient seen by GI and does not want to undergo further evaluation for colonic neoplasm at this time. She denies any GI symptoms at this time. Hb 10.8this AM.   - Continue to monitor  Polyarthropathy:  Patient has had trouble standing from sitting position for few months with intermittent rash. She was noted to  have elevated WBCs, ESR and MPO on previous admissions and referred to rheumatology. She was diagnosed with polyarthropathy with suspicion of polymyalgia rheumatica. She was started on low dose prednisone with resulting improvement in symptoms. - Continue prednisone'40mg'$  qd - Continue to monitor   Type II DM: Patient with Hx of Type 2 DM with recent hyperglycemia due to prednisone.  - SSI  - CBG monitoring  Diet:Heart Healthy DVT Prophylaxis:Lovenox Code: DNR/DNI    Dispo: Anticipated discharge in approximately 2-3 day(s).   Harvie Heck, MD  Internal Medicine PGY1 10/21/2018, 8:06 AM Pager: [phone]

## 2018-10-21 NOTE — Care Management Important Message (Signed)
Important Message  Patient Details  Name: Linda Ray MRN: [id number] Date of Birth: 13-May-1934   Medicare Important Message Given:  Yes     Orbie Pyo 10/21/2018, 2:28 PM

## 2018-10-21 NOTE — Progress Notes (Addendum)
Called for stat visit of patient due to increased work of breathing and audible expiratory wheezes. Patient given PRN levalbuterol and placed on bipap for WOB. MD notified by RN will continue to monitor patient.

## 2018-10-21 NOTE — Progress Notes (Signed)
After scanning medication, pt stated that she did not want the breathing treatment.

## 2018-10-21 NOTE — Care Management Important Message (Signed)
Important Message  Patient Details  Name: Linda Ray MRN: [id number] Date of Birth: 06-21-1934   Medicare Important Message Given:  Yes     Orbie Pyo 10/21/2018, 2:27 PM

## 2018-10-21 NOTE — Evaluation (Signed)
Occupational Therapy Evaluation Patient Details Name: Linda Ray MRN: [id number] DOB: Sep 17, 1934 Today's Date: 10/21/2018    History of Present Illness 84 year old woman with a history of breast cancer, COPD, iron deficiency anemia, DM, restless leg syndrome, and likely PMR admitted with acute shortness of breath. In ED pt in significant respiratory distress and was started on BiPAP. Admitted 10/18/18 for treatment of Acute hypoxic and hypercarbic respiratory failure.   Clinical Impression   This 84 yo female admitted with above presents to acute OT with decreased activity tolerance, decreased mobility, decreased balance all affecting pt's safety and independence with basic ADLs as she was able to do these by herself not long before admission. She will benefit from acute OT with follow up at SNF.    Follow Up Recommendations  SNF;Supervision/Assistance - 24 hour;Other (comment)(If progresses quickly then [facility] would be appropriate)    Equipment Recommendations  None recommended by OT       Precautions / Restrictions Precautions Precautions: Fall Precaution Comments: monitor O2 (stable this session on 8 liters HFNC) Restrictions Weight Bearing Restrictions: No      Mobility Bed Mobility Overal bed mobility: Needs Assistance Bed Mobility: Supine to Sit     Supine to sit: Min guard     General bed mobility comments: increased time  Transfers Overall transfer level: Needs assistance   Transfers: Sit to/from Stand;Stand Pivot Transfers Sit to Stand: Min assist;+2 physical assistance Stand pivot transfers: Min assist;Mod assist;+2 physical assistance       General transfer comment: BiL HHA A (1st stand pivot Mod A +2--did not want to move feet) (2nd stand pivot Min A +2)    Balance Overall balance assessment: Needs assistance Sitting-balance support: No upper extremity supported;Feet supported Sitting balance-Leahy Scale: Fair     Standing balance support: Bilateral  upper extremity supported Standing balance-Leahy Scale: Poor                             ADL either performed or assessed with clinical judgement   ADL Overall ADL's : Needs assistance/impaired Eating/Feeding: Independent;Sitting   Grooming: Set up;Supervision/safety;Sitting   Upper Body Bathing: Supervision/ safety;Set up;Sitting   Lower Body Bathing: Minimal assistance Lower Body Bathing Details (indicate cue type and reason): min A +2 sit<>stand Upper Body Dressing : Set up;Supervision/safety;Sitting   Lower Body Dressing: Minimal assistance Lower Body Dressing Details (indicate cue type and reason): min A +2 sit<>stand Toilet Transfer: Moderate assistance;+2 for physical assistance;Minimal assistance Toilet Transfer Details (indicate cue type and reason): BiL HHA A (1st stand pivot Mod A +2--did not want to move feet) (2nd stand pivot Min A +2) Toileting- Clothing Manipulation and Hygiene: Total assistance Toileting - Clothing Manipulation Details (indicate cue type and reason): min A +2 sit<>stand             Vision Patient Visual Report: No change from baseline              Pertinent Vitals/Pain Pain Assessment: No/denies pain     Hand Dominance Right   Extremity/Trunk Assessment Upper Extremity Assessment Upper Extremity Assessment: Overall WFL for tasks assessed           Communication Communication Communication: No difficulties   Cognition Arousal/Alertness: Lethargic;Suspect due to medications Behavior During Therapy: [facility] for tasks assessed/performed Overall Cognitive Status: Within Functional Limits for tasks assessed  Home Living Family/patient expects to be discharged to:: Skilled nursing facility Living Arrangements: Children Available Help at Discharge: Family;Available PRN/intermittently Type of Home: House Home Access: Stairs to enter [employer] of  Steps: 4 Entrance Stairs-Rails: Right Home Layout: One level               Home Equipment: Walker - 4 wheels;Bedside commode;Shower seat   Additional Comments: dtr has moved in with her      Prior Functioning/Environment Level of Independence: Independent with assistive device(s)                 OT Problem List: Decreased strength;Decreased activity tolerance;Impaired balance (sitting and/or standing);Cardiopulmonary status limiting activity      OT Treatment/Interventions: Self-care/ADL training;DME and/or AE instruction;Patient/family education;Balance training;Energy conservation    OT Goals(Current goals can be found in the care plan section) Acute Rehab OT Goals Patient Stated Goal: Not sure about rehab--thinking about it OT Goal Formulation: With patient Time For Goal Achievement: 11/04/18 Potential to Achieve Goals: Good  OT Frequency: Min 2X/week           Co-evaluation PT/OT/SLP Co-Evaluation/Treatment: Yes Reason for Co-Treatment: For patient/therapist safety;To address functional/ADL transfers PT goals addressed during session: Mobility/safety with mobility;Balance;Strengthening/ROM OT goals addressed during session: ADL's and self-care;Strengthening/ROM      AM-PAC OT "6 Clicks" Daily Activity     Outcome Measure Help from another person eating meals?: None Help from another person taking care of personal grooming?: A Little Help from another person toileting, which includes using toliet, bedpan, or urinal?: A Lot Help from another person bathing (including washing, rinsing, drying)?: A Lot Help from another person to put on and taking off regular upper body clothing?: A Little Help from another person to put on and taking off regular lower body clothing?: A Lot 6 Click Score: 16   End of Session Equipment Utilized During Treatment: Oxygen(8 liters HFNC)  Activity Tolerance: Patient limited by lethargy;Other (comment)(limited by twitching due to  rest leg syndrome per pt) Patient left: in bed;with call bell/phone within reach;with bed alarm set  OT Visit Diagnosis: Unsteadiness on feet (R26.81);Other abnormalities of gait and mobility (R26.89);Muscle weakness (generalized) (M62.81)                Time: [id number] OT Time Calculation (min): 21 min Charges:  OT General Charges $OT Visit: 1 Visit OT Evaluation $OT Eval Moderate Complexity: 1 Mod  [place], OTR/L Acute [employer] Pager [phone] Office [phone]     Almon Register 10/21/2018, 12:31 PM

## 2018-10-21 NOTE — Progress Notes (Signed)
Patient refused the use of BIPAP for the evening. Will continue to monitor patient.

## 2018-10-21 NOTE — Progress Notes (Signed)
Pt off BiPAP at this time and placed on HFNC 8L tolerated well.

## 2018-10-21 NOTE — Evaluation (Signed)
Physical Therapy Evaluation Patient Details Name: Linda Ray MRN: [id number] DOB: Jul 22, 1934 Today's Date: 10/21/2018   History of Present Illness  84 year old woman with a history of breast cancer, COPD, iron deficiency anemia, DM, restless leg syndrome, and likely PMR admitted with acute shortness of breath. In ED pt in significant respiratory distress and was started on BiPAP. Admitted 10/18/18 for treatment of Acute hypoxic and hypercarbic respiratory failure.  Clinical Impression  PTA pt living with daughter in single story home with 4 steps to enter. Pt was independent with ambulation sometimes with RW and with ADLs, daughter assists with iADLs. Pt is currently limited in safe mobility by SoB in presence of decreased strength and endurance. PT recommending SNF level rehab, however possible pt will progress to HHPT prior to discharge. PT will continue to follow acutely.     Follow Up Recommendations SNF;Other (comment)(may progress to HHPT)    Equipment Recommendations  None recommended by PT       Precautions / Restrictions Precautions Precautions: Fall Precaution Comments: monitor O2 (stable this session on 8 liters HFNC) Restrictions Weight Bearing Restrictions: No      Mobility  Bed Mobility Overal bed mobility: Needs Assistance Bed Mobility: Supine to Sit     Supine to sit: Min guard     General bed mobility comments: increased time  Transfers Overall transfer level: Needs assistance   Transfers: Sit to/from Stand;Stand Pivot Transfers Sit to Stand: Min assist;+2 physical assistance Stand pivot transfers: Min assist;Mod assist;+2 physical assistance       General transfer comment: BiL HHA A (1st stand pivot Mod A +2--did not want to move feet) (2nd stand pivot Min A +2)  Ambulation/Gait Ambulation/Gait assistance: Mod assist Gait Distance (Feet): 2 Feet Assistive device: 2 person hand held assist Gait Pattern/deviations: Step-to pattern;Decreased step  length - right;Decreased step length - left;Shuffle;Trunk flexed Gait velocity: slowed Gait velocity interpretation: <1.31 ft/sec, indicative of household ambulator General Gait Details: modA for steadying with shuffling steps from bed t/from recliner        Balance Overall balance assessment: Needs assistance Sitting-balance support: No upper extremity supported;Feet supported Sitting balance-Leahy Scale: Fair     Standing balance support: Bilateral upper extremity supported Standing balance-Leahy Scale: Poor                               Pertinent Vitals/Pain Pain Assessment: No/denies pain    Home Living Family/patient expects to be discharged to:: Skilled nursing facility Living Arrangements: Children Available Help at Discharge: Family;Available PRN/intermittently Type of Home: House Home Access: Stairs to enter Entrance Stairs-Rails: Right Entrance Stairs-Number of Steps: 4 Home Layout: One level Home Equipment: Walker - 4 wheels;Bedside commode;Shower seat Additional Comments: dtr has moved in with her    Prior Function Level of Independence: Independent with assistive device(s)               Hand Dominance   Dominant Hand: Right    Extremity/Trunk Assessment   Upper Extremity Assessment Upper Extremity Assessment: Defer to OT evaluation    Lower Extremity Assessment Lower Extremity Assessment: Generalized weakness       Communication   Communication: No difficulties  Cognition Arousal/Alertness: Lethargic;Suspect due to medications Behavior During Therapy: [facility] for tasks assessed/performed Overall Cognitive Status: Within Functional Limits for tasks assessed  General Comments General comments (skin integrity, edema, etc.): Restless Leg syndrome prohibited pt from remaining up in chair, does not take medication and usually relieves sensation by walking around. Pt able to achieve  greater LE movement in bed than chair        Assessment/Plan    PT Assessment Patient needs continued PT services  PT Problem List Decreased strength;Decreased activity tolerance;Decreased balance;Decreased mobility       PT Treatment Interventions DME instruction;Gait training;Functional mobility training;Therapeutic activities;Therapeutic exercise;Balance training;Cognitive remediation;Patient/family education    PT Goals (Current goals can be found in the Care Plan section)  Acute Rehab PT Goals Patient Stated Goal: is thinking about rehab PT Goal Formulation: With patient Time For Goal Achievement: 11/04/18 Potential to Achieve Goals: Fair    Frequency Min 2X/week        Co-evaluation PT/OT/SLP Co-Evaluation/Treatment: Yes Reason for Co-Treatment: For patient/therapist safety PT goals addressed during session: Mobility/safety with mobility;Balance OT goals addressed during session: ADL's and self-care;Strengthening/ROM       AM-PAC PT "6 Clicks" Mobility  Outcome Measure Help needed turning from your back to your side while in a flat bed without using bedrails?: None Help needed moving from lying on your back to sitting on the side of a flat bed without using bedrails?: A Little Help needed moving to and from a bed to a chair (including a wheelchair)?: A Lot Help needed standing up from a chair using your arms (e.g., wheelchair or bedside chair)?: A Little Help needed to walk in hospital room?: A Lot Help needed climbing 3-5 steps with a railing? : Total 6 Click Score: 15    End of Session Equipment Utilized During Treatment: Gait belt;Oxygen Activity Tolerance: Patient tolerated treatment well Patient left: in bed;with call bell/phone within reach;with bed alarm set Nurse Communication: Mobility status PT Visit Diagnosis: Unsteadiness on feet (R26.81);Other abnormalities of gait and mobility (R26.89);Muscle weakness (generalized) (M62.81);Difficulty in walking,  not elsewhere classified (R26.2)    Time: [id number] PT Time Calculation (min) (ACUTE ONLY): 21 min   Charges:   PT Evaluation $PT Eval Moderate Complexity: 1 Mod          Norva Bowe B. Migdalia Dk PT, DPT Acute Rehabilitation Services Pager [phone] Office [phone]   [place] 10/21/2018, 3:19 PM

## 2018-10-21 NOTE — Progress Notes (Signed)
  Date: 10/21/2018  Patient name: Linda Ray  Medical record number: [id number]  Date of birth: December 24, 1934   I have seen and evaluated this patient and I have discussed the plan of care with the house staff. Please see their note for complete details. I concur with their findings with the following additions/corrections:   Appears more tired today, asked to be taken off bipap back onto HFNC. Procalcitonin is improved but still quite elevated, will continue IV antibiotics, steroids, and bronchodilators. JVP is elevated again today, will diurese with IV lasix.  Lenice Pressman, M.D., Ph.D. 10/21/2018, 3:03 PM

## 2018-10-21 NOTE — Progress Notes (Signed)
Pharmacy Antibiotic Note  Linda Ray is a 84 y.o. female admitted on 10/18/2018 with aspiration PNA.  Pharmacy has been consulted for Cefepime dosing.  ID: cefepime D4 for asp PNA, WBC 21.9>17.4, afebrile Procalcitonin 13.3>10.1>3.93. Scr back down to 1.06.  8/22 Cefepime>> 8/23 Azithro>>  8/22 blood x2: NGTD 8/22 MRSA PCR: Neg  Plan: Increase Cefepime back to 2g IV q 12h  Height: 5\' 1"  (154.9 cm) Weight: 112 lb 10.5 oz (51.1 kg) IBW/kg (Calculated) : 47.8  Temp (24hrs), Avg:97.9 F (36.6 C), Min:96.9 F (36.1 C), Max:98.4 F (36.9 C)  Recent Labs  Lab 10/18/18 0512 10/19/18 0428 10/20/18 0340 10/21/18 0415  WBC 35.1* 21.9* 17.1* 17.4*  CREATININE 0.93 1.09* 1.31* 1.06*    Estimated Creatinine Clearance: 30.9 mL/min (A) (by C-G formula based on SCr of 1.06 mg/dL (H)).    Allergies  Allergen Reactions  . Codeine Nausea And Vomiting  . Sulfa Drugs Cross Reactors Other (See Comments)    unknown  . Tetanus-Diphth-Acell Pertussis Other (See Comments)    Significant local reaction  . Metformin And Related Diarrhea    Ki Corbo S. Alford Highland, PharmD, BCPS Clinical Staff Pharmacist [phone]  Eilene Ghazi [facility] 10/21/2018 10:49 AM

## 2018-10-21 NOTE — Progress Notes (Addendum)
During bedside reporting at 2330 patient HR increased to the 120's to 130's with respirations in 20's to 30's following a panic attack per patient. BP was 152/99. Patient's MEWS also increased to a 4. Christian,MD notified. MD returned page and placed a order for 0.5mg  of ativan. Patient's HR lowered to low 100's and her respirations lowered to 13 to 18 after speaking with the doctor without administration of ativan. Pt's MEWS decreased to 1. Patient in room resting at this time. Oncoming RN [place] going to talk to patient about taking the one time dose of Ativan. Will re-page MD if patient refuses one time dose of Ativan per [place], Therapist, sports. Will continue to monitor and treat per MD orders.

## 2018-10-21 NOTE — Progress Notes (Signed)
0110 Made patient aware oncall Dr. Darrick Meigs  ordered ativan  for anxiety. Patient refused. Patient resting at that time. Only wanted the nurse to sit and talk to her. Reassessed patient at 230 Patient having expiratory wheezing. Offered nebulizer and ativan to help. Patient refused any medication.  0358 Made oncall Dr. Darrick Meigs aware of Patient heart rate in the 130s, expiratory wheezing. Per patient it's due to coughing. Patient refused any medication at this time. 426 patient complained of shortness of breath and desated to 85 on 8L HFNC. Repositioned patient in the bed, reaassesed patient - Bilateral expiratory wheezing, excessive coughing. Patient refused any medications or bipap. Nurse sat in patient room and talk with patient. 36 Notified Dr. Darrick Meigs about patient desating, expiratory wheezing. 512 ativan given, no relief. Patient began to have labored breathing, tachypnea . 524 Called respiratory. Respiratory came to bedside place patient on bipap. Paged and notified  Dr. Darrick Meigs at [phone]. [phone] Paged Dr. Darrick Meigs for order for nebulizer and at 550 for order for flutter valve.

## 2018-10-22 DIAGNOSIS — I5021 Acute systolic (congestive) heart failure: Secondary | ICD-10-CM

## 2018-10-22 LAB — COMPREHENSIVE METABOLIC PANEL
ALT: 34 U/L (ref 0–44)
AST: 28 U/L (ref 15–41)
Albumin: 2.6 g/dL — ABNORMAL LOW (ref 3.5–5.0)
Alkaline Phosphatase: 74 U/L (ref 38–126)
Anion gap: 13 (ref 5–15)
BUN: 42 mg/dL — ABNORMAL HIGH (ref 8–23)
CO2: 30 mmol/L (ref 22–32)
Calcium: 9 mg/dL (ref 8.9–10.3)
Chloride: 92 mmol/L — ABNORMAL LOW (ref 98–111)
Creatinine, Ser: 1.04 mg/dL — ABNORMAL HIGH (ref 0.44–1.00)
GFR calc Af Amer: 58 mL/min — ABNORMAL LOW (ref >60)
GFR calc non Af Amer: 50 mL/min — ABNORMAL LOW (ref >60)
Glucose, Bld: 151 mg/dL — ABNORMAL HIGH (ref 70–99)
Potassium: 4.5 mmol/L (ref 3.5–5.1)
Sodium: 135 mmol/L (ref 135–145)
Total Bilirubin: 1.4 mg/dL — ABNORMAL HIGH (ref 0.3–1.2)
Total Protein: 5.6 g/dL — ABNORMAL LOW (ref 6.5–8.1)

## 2018-10-22 LAB — GLUCOSE, CAPILLARY
Glucose-Capillary: 135 mg/dL — ABNORMAL HIGH (ref 70–99)
Glucose-Capillary: 169 mg/dL — ABNORMAL HIGH (ref 70–99)
Glucose-Capillary: 284 mg/dL — ABNORMAL HIGH (ref 70–99)
Glucose-Capillary: 285 mg/dL — ABNORMAL HIGH (ref 70–99)
Glucose-Capillary: 187 mg/dL — ABNORMAL HIGH (ref 70–99)

## 2018-10-22 LAB — CBC
HCT: 34.2 % — ABNORMAL LOW (ref 36.0–46.0)
Hemoglobin: 11 g/dL — ABNORMAL LOW (ref 12.0–15.0)
MCH: 28.4 pg (ref 26.0–34.0)
MCHC: 32.2 g/dL (ref 30.0–36.0)
MCV: 88.1 fL (ref 80.0–100.0)
Platelets: 550 10*3/uL — ABNORMAL HIGH (ref 150–400)
RBC: 3.88 MIL/uL (ref 3.87–5.11)
RDW: 16.4 % — ABNORMAL HIGH (ref 11.5–15.5)
WBC: 12.5 10*3/uL — ABNORMAL HIGH (ref 4.0–10.5)
nRBC: 0 % (ref 0.0–0.2)

## 2018-10-22 MED ORDER — BOOST / RESOURCE BREEZE PO LIQD CUSTOM
1.0000 | Freq: Two times a day (BID) | ORAL | Status: DC
  Administered 2018-10-23 (×2): 1 via ORAL

## 2018-10-22 MED ORDER — UMECLIDINIUM-VILANTEROL 62.5-25 MCG/INH IN AEPB
1.0000 | INHALATION_SPRAY | Freq: Every day | RESPIRATORY_TRACT | Status: DC
  Filled 2018-10-22: qty 14

## 2018-10-22 NOTE — Progress Notes (Signed)
Subjective: Patient evaluated at bedside this morning. She appears to be more comfortable this morning. She is on HFNC since previous night and continues to saturate well. No acute overnight concerns.   Objective:  Vital signs in last 24 hours: Vitals:   10/21/18 1917 10/21/18 2001 10/21/18 2200 10/22/18 0400  BP: 127/83  132/80 125/72  Pulse: (!) 104  98 91  Resp: (!) _0 Temp:  97.9 F (36.6 C)  97.9 F (36.6 C)  TempSrc:  Oral    SpO2: 100%  100% 100%  Weight:    50.8 kg  Height:       Physical Exam Constitutional:      General: She is not in acute distress. Cardiovascular:     Rate and Rhythm: Regular rhythm. Tachycardia present.     Pulses: Normal pulses.     Heart sounds: Normal heart sounds. No murmur. No friction rub. No gallop.      Comments: JVD improving  Pulmonary:     Effort: Pulmonary effort is normal. No accessory muscle usage.     Comments: Expiratory wheezing in all lung fields, improving Abdominal:     General: Bowel sounds are normal.     Palpations: Abdomen is soft. There is no mass.     Tenderness: There is no abdominal tenderness.  Skin:    General: Skin is warm and dry.  Neurological:     General: No focal deficit present.     Mental Status: She is alert. She is disoriented.     Assessment/Plan:  84 yo female with PMHx of COPD, iron deficiency anemia, and borderline DMand polyarthropathy concerning for PMRpresenting with sudden onset shortness of breathfor requiring NIPPV.   Sepsissecondary to [employer] AcquiredPneumonia: Patient with history of COPD with sudden onset shortness of breath requiring BiPAP.Patient presented with respiratory distress with hypoxia on arrival, placed on BiPAP with improvement in symptoms. She continues to require O2 support via HFNC. Patient remains afebrile but is tachycardic and tachypnic, improved from previous exams.WBC 12.5 (improved from yesterday) procalcitonin trending down with cefepime and  azithromycin. Blood cultures negative to date.  - Continue Cefepime 2g IV q12hand Azithromycin 566m IV qd - Continue to monitor CBC - Continue to monitor vitals  Acute hypoxic and hypercarbic respiratory failure: Patient with history of COPD and chronic tobacco use with sudden onset dyspnea requiring BiPAP due to respiratory distress and hypoxia withABG:pH7.214; pCO263.7;HCO3 25.3, pO2100.Patient tolerating HFNC well since yesterday and appears to be more comfortable this morning.   -Continue duoneb 368mneb q4h -Prednisone 4082md - IV lasix 64m7m - Continue to monitor respiratory status  Heart failure with reduced ejection fraction:  Patient with elevated BNP, JVP on examination and cardiomegaly on examination. Echo with EF 30-35% with LV apical and anteroseptal akinesis and atrial enlargement. Patient received Lasix 64mg59m yesterday for volume overload. This morning, volume status improving. Will decrease to Lasix 64mg 10m daily.   - Lasix 64mg q9mron deficiency anemia:  Patient with chronic anemia, previously thought to be anemia of chronic disease but recent iron studies in April 2020 consistent with iron deficiency anemia (Fe: 10, TIBC 177, %Sat: 6, Ferritin 309). She has had two sessions of Injectafer in June 2020. Found to be FOBT +. Previous colonoscopy in 2011 was normal. However, patient has had 15lb unintentional weight loss and fatigue over past few months. On chart review, patient seen by GI and does not want to undergo further evaluation for colonic  neoplasm at this time. She denies any GI symptoms at this time. Hb remains stable.   - Continue to monitor  Polyarthropathy:  Patient has had trouble standing from sitting position for few months with intermittent rash. She was noted to have elevated WBCs, ESR and MPO on previous admissions and referred to rheumatology. She was diagnosed with polyarthropathy with suspicion of polymyalgia rheumatica.  She was started on low dose prednisone with resulting improvement in symptoms. - Continue prednisone94m qd - Continue to monitor   Type II DM: Patient with Hx of Type 2 DM with recent hyperglycemia due to prednisone.  - SSI  - CBG monitoring  Diet:Heart Healthy DVT Prophylaxis:Lovenox Code: DNR/DNI   Dispo: Anticipated discharge in approximately 1-2 day(s).   AHarvie Heck MD  Internal Medicine PGY1  10/22/2018, 8:11 AM Pager: [phone]

## 2018-10-22 NOTE — Plan of Care (Signed)
  Problem: Safety: Goal: Ability to remain free from injury will improve Outcome: Progressing   

## 2018-10-22 NOTE — Plan of Care (Signed)
  Problem: Education: Goal: Knowledge of General Education information will improve Description Including pain rating scale, medication(s)/side effects and non-pharmacologic comfort measures Outcome: Progressing   Problem: Health Behavior/Discharge Planning: Goal: Ability to manage health-related needs will improve Outcome: Progressing   

## 2018-10-22 NOTE — Progress Notes (Signed)
Nutrition Follow-up  DOCUMENTATION CODES:   Non-severe (moderate) malnutrition in context of chronic illness  INTERVENTION:   -D/c Ensure Enlive po BID, each supplement provides 350 kcal and 20 grams of protein -Boost Breeze po TID, each supplement provides 250 kcal and 9 grams of protein -Continue MVI with minerals daily -Magic cup TID with meals, each supplement provides 290 kcal and 9 grams of protein -Hormel Shake TID with meals, each supplement provides 520 kcals and 22 grams protein  NUTRITION DIAGNOSIS:   Moderate Malnutrition related to chronic illness(COPD) as evidenced by mild fat depletion, moderate fat depletion, mild muscle depletion, moderate muscle depletion, percent weight loss.  Ongoing  GOAL:   Patient will meet greater than or equal to 90% of their needs  Progressing   MONITOR:   PO intake, Supplement acceptance, Labs, Weight trends, Skin, I & O's  REASON FOR ASSESSMENT:   Consult, Malnutrition Screening Tool Assessment of nutrition requirement/status  ASSESSMENT:   84 y.o. yo female w/ PMH significant for  Breast cancer,COPD, iron deficiency anemia, steroid induced DM, and chronic back pain presents with shortness of breath since the previous night. She reports that the shortness of breath was sudden in onset and that she tried using 2 puffs of her Provair inhaler without any relief. Due to the continued dyspnea, the patient called EMS and was given more albuterol in addition to solumedrol and magnesium, placed on oxygen via nonrebreather and brought to ED. Patient had a similar episode 2 days ago that was relieved with 2 puffs of albuterol. Of note, she was seen by PCP yesterday and per daughter, chest x-ray at PCP office showed initial formation of pneumonia but no antibiotics were given. She denies chest pain, headaches, dizziness, recent sick contacts, fever/chills.  Reviewed I/O's: -1.6 L x 24 hours and -3.5 L since admission  UOP: 2.1 L x 24  hours  Reviewed chart; pt refusing breathing treatments due to agitation.   Pt with poor appetite; meal completion 25%. Pt also refusing Ensure supplements.   Medications reviewed and include prednisone.   Labs reviewed: CBGS: 115-244 (inpatient orders for glycemic control are 0-15 units insulin aspart TID with meals and 0-5 units insulin aspart q HS).   Diet Order:   Diet Order            Diet heart healthy/carb modified Room service appropriate? Yes; Fluid consistency: Thin  Diet effective now              EDUCATION NEEDS:   No education needs have been identified at this time  Skin:  Skin Assessment: Reviewed RN Assessment  Last BM:  10/17/18  Height:   Ht Readings from Last 1 Encounters:  10/18/18 5\' 1"  (1.549 m)    Weight:   Wt Readings from Last 1 Encounters:  10/22/18 50.8 kg    Ideal Body Weight:  47.7 kg  BMI:  Body mass index is 21.16 kg/m.  Estimated Nutritional Needs:   Kcal:  1500-1700  Protein:  65-80 grams  Fluid:  > 1.5 L    Linda Ray, RD, LDN, [place] Registered Dietitian II Certified Diabetes Care and Education Specialist Pager: [phone] After hours Pager: [phone]

## 2018-10-22 NOTE — Progress Notes (Signed)
Pt refused breathing treatment. Stated that is makes her agitated.

## 2018-10-22 NOTE — Progress Notes (Signed)
  Date: 10/22/2018  Patient name: Linda Ray  Medical record number: [id number]  Date of birth: 06/10/1934   I have seen and evaluated this patient and I have discussed the plan of care with the house staff. Please see their note for complete details. I concur with their findings with the following additions/corrections:   Much improved today, appears comfortable on HFNC. Lung sounds much improved. WBC down to 12.5. JVP still elevated but decreased from yesterday.   Will continue IV abx - day 5 of cefepime, day 4 of azithromycin. Will check procalcitonin tomorrow, consider stopping if it has normalized.  Continue steroids and bronchodilators for COPD exacerbation.  One dose of furosemide today for JVD and acute HFrEF, will reassess daily.   Lenice Pressman, M.D., Ph.D. 10/22/2018, 3:15 PM

## 2018-10-23 LAB — GLUCOSE, CAPILLARY
Glucose-Capillary: 129 mg/dL — ABNORMAL HIGH (ref 70–99)
Glucose-Capillary: 196 mg/dL — ABNORMAL HIGH (ref 70–99)
Glucose-Capillary: 395 mg/dL — ABNORMAL HIGH (ref 70–99)

## 2018-10-23 LAB — COMPREHENSIVE METABOLIC PANEL
ALT: 36 U/L (ref 0–44)
AST: 20 U/L (ref 15–41)
Albumin: 2.6 g/dL — ABNORMAL LOW (ref 3.5–5.0)
Alkaline Phosphatase: 79 U/L (ref 38–126)
Anion gap: 10 (ref 5–15)
BUN: 49 mg/dL — ABNORMAL HIGH (ref 8–23)
CO2: 33 mmol/L — ABNORMAL HIGH (ref 22–32)
Calcium: 8.9 mg/dL (ref 8.9–10.3)
Chloride: 93 mmol/L — ABNORMAL LOW (ref 98–111)
Creatinine, Ser: 1.18 mg/dL — ABNORMAL HIGH (ref 0.44–1.00)
GFR calc Af Amer: 50 mL/min — ABNORMAL LOW (ref >60)
GFR calc non Af Amer: 43 mL/min — ABNORMAL LOW (ref >60)
Glucose, Bld: 210 mg/dL — ABNORMAL HIGH (ref 70–99)
Potassium: 3.7 mmol/L (ref 3.5–5.1)
Sodium: 136 mmol/L (ref 135–145)
Total Bilirubin: 0.7 mg/dL (ref 0.3–1.2)
Total Protein: 5.4 g/dL — ABNORMAL LOW (ref 6.5–8.1)

## 2018-10-23 LAB — CBC
HCT: 34.3 % — ABNORMAL LOW (ref 36.0–46.0)
Hemoglobin: 11.1 g/dL — ABNORMAL LOW (ref 12.0–15.0)
MCH: 28.8 pg (ref 26.0–34.0)
MCHC: 32.4 g/dL (ref 30.0–36.0)
MCV: 88.9 fL (ref 80.0–100.0)
Platelets: 562 10*3/uL — ABNORMAL HIGH (ref 150–400)
RBC: 3.86 MIL/uL — ABNORMAL LOW (ref 3.87–5.11)
RDW: 16.4 % — ABNORMAL HIGH (ref 11.5–15.5)
WBC: 13.7 10*3/uL — ABNORMAL HIGH (ref 4.0–10.5)
nRBC: 0 % (ref 0.0–0.2)

## 2018-10-23 LAB — CULTURE, BLOOD (ROUTINE X 2)
Culture: NO GROWTH
Culture: NO GROWTH
Special Requests: ADEQUATE

## 2018-10-23 LAB — PROCALCITONIN: Procalcitonin: 1.05 ng/mL

## 2018-10-23 MED ORDER — ENOXAPARIN SODIUM 30 MG/0.3ML ~~LOC~~ SOLN
30.0000 mg | SUBCUTANEOUS | Status: DC
  Filled 2018-10-23 (×2): qty 0.3

## 2018-10-23 MED ORDER — PREDNISONE 20 MG PO TABS
40.0000 mg | ORAL_TABLET | Freq: Every day | ORAL | Status: AC
  Administered 2018-10-23: 40 mg via ORAL

## 2018-10-23 MED ORDER — PREDNISONE 20 MG PO TABS
30.0000 mg | ORAL_TABLET | Freq: Every day | ORAL | Status: DC
  Administered 2018-10-24 – 2018-10-26 (×3): 30 mg via ORAL
  Filled 2018-10-23 (×3): qty 1

## 2018-10-23 MED ORDER — INSULIN GLARGINE 100 UNIT/ML ~~LOC~~ SOLN
5.0000 [IU] | Freq: Every day | SUBCUTANEOUS | Status: DC
  Administered 2018-10-23 – 2018-10-24 (×2): 5 [IU] via SUBCUTANEOUS
  Filled 2018-10-23 (×2): qty 0.05

## 2018-10-23 NOTE — Plan of Care (Signed)
  Problem: Activity: Goal: Risk for activity intolerance will decrease Outcome: Progressing   Problem: Clinical Measurements: Goal: Respiratory complications will improve Outcome: Progressing   Problem: Safety: Goal: Ability to remain free from injury will improve Outcome: Progressing

## 2018-10-23 NOTE — Progress Notes (Addendum)
Subjective: Patient evaluated at bedside this morning. Tachycardia and tachypnea improved. Continues to be on HFNC. She appears to be in no acute distress however reports that she feels "not quite right". She is unable to explain further about this feeling.  Of note, she has been declining breathing treatments as she does not notice any benefit from them. She also reported that furosemide made her more restless yesterday.   Objective:  Vital signs in last 24 hours: Vitals:   10/23/18 0749 10/23/18 0822 10/23/18 1000 10/23/18 1233  BP: (!) 158/87 (!) 144/81  113/71  Pulse: 93  90 93  Resp: 17  (!) 22 18  Temp:  97.7 F (36.5 C)  97.9 F (36.6 C)  TempSrc:  Oral  Oral  SpO2: 100%  100% 96%  Weight:      Height:       Physical Exam Constitutional:      General: She is not in acute distress. Cardiovascular:     Rate and Rhythm: Normal rate and regular rhythm.     Pulses: Normal pulses.     Heart sounds: Normal heart sounds. No murmur. No friction rub. No gallop.      Comments: JVD up to mid neck Pulmonary:     Effort: Pulmonary effort is normal. No tachypnea or respiratory distress.     Comments: Diffuse expiratory wheezing in all lung fields, improving  Chest:     Chest wall: No tenderness.  Abdominal:     General: Bowel sounds are normal.     Palpations: Abdomen is soft. There is no mass.     Tenderness: There is no abdominal tenderness.  Neurological:     General: No focal deficit present.     Mental Status: She is alert and oriented to person, place, and time.     Cranial Nerves: No cranial nerve deficit.     Motor: No weakness.      Assessment/Plan:  84 yo female with PMHx of COPD, iron deficiency anemia, and borderline DMand polyarthropathy concerning for PMRpresenting with sudden onset shortness of breathfor requiring NIPPV. Since then, patient has been weaned off and is saturating well on minimal HFNC.   Sepsissecondary to [employer] AcquiredPneumonia:  Patient with history of COPD with sudden onset shortness of breath requiring BiPAP.Patient presented with respiratory distress with hypoxia on arrival, placed on BiPAP with improvement in symptoms. She continues to require O2 support via HFNC. Patient remains afebrile. Tachycardia and tachypnea improved.DZH29.9 with procalcitonin trending down. Patient has completed 5 days of azithromycin and 6 days of cefepime. Will discontinue azithromycin at this point and cefepime to be d/c'd tomorrow.  Blood cultures negative to date.  -ContinueCefepime 2g IV q12h Day 6/7 - Continue to monitor CBC - Continue to monitor vitals  Acute hypoxic and hypercarbic respiratory failure: Patient with history of COPD and chronic tobacco use with sudden onset dyspnea requiring BiPAP due to respiratory distress and hypoxia withABG:pH7.214; pCO263.7;HCO3 25.3, pO2100.Patient tolerating HFNC well since yesterday and appears to be more tired this morning. She is refusing breathing treatments as she does not have any immediate benefits. She continues to have persistent expiratory wheezing on auscultation. Will slowly taper prednisone to baseline of '10mg'$  (start prednisone '30mg'$  qd tomorrow).    -Prednisone '40mg'$  qd - Holding lasix today (see below) - Continue to monitor respiratory status  Heart failure with reduced ejection fraction:  Patient with elevated BNP, JVP on examination and cardiomegaly on examination. Echo with EF 30-35% with LV apical and anteroseptal  akinesis and atrial enlargement. Patient received one dose of Lasix '40mg'$  yesterday. She reports that she became more restless after the furosemide yesterday.  This morning, volume status improved from yesterday although JVP still elevated. Will hold diuresis for today.   - Holding lasix today  Iron deficiency anemia:  Patient with chronic anemia, previously thought to be anemia of chronic disease but recent iron studies in April 2020 consistent  with iron deficiency anemia (Fe: 10, TIBC 177, %Sat: 6, Ferritin 309). She has had two sessions of Injectafer in June 2020. Found to be FOBT +. Previous colonoscopy in 2011 was normal. However, patient has had 15lb unintentional weight loss and fatigue over past few months. On chart review, patient seen by GI and does not want to undergo further evaluation for colonic neoplasm at this time. She denies any GI symptoms at this time. Hb remains stable.   - Continue to monitor  Polyarthropathy:  Patient has had trouble standing from sitting position for few months with intermittent rash. She was noted to have elevated WBCs, ESR and MPO on previous admissions and referred to rheumatology. She was diagnosed with polyarthropathy with suspicion of polymyalgia rheumatica. She was started on low dose prednisone with resulting improvement in symptoms. - Continue prednisone'40mg'$  qd - Continue to monitor   Type II DM: Patient with Hx of Type 2 DM with recent hyperglycemia due to prednisone. Will add on Lantus 5U.  - Lantus 5U + SSI  - CBG monitoring  Diet:Heart Healthy DVT Prophylaxis:Lovenox Code: DNR/DNI  Dispo: Anticipated discharge in approximately 3-4 day(s).   Harvie Heck, MD  Internal Medicine PGY1  10/23/2018, 3:01 PM Pager: [phone]

## 2018-10-23 NOTE — Progress Notes (Signed)
  Date: 10/23/2018  Patient name: Linda Ray  Medical record number: [id number]  Date of birth: [birth year]   I have seen and evaluated this patient and I have discussed the plan of care with the house staff. Please see their note for complete details. I concur with their findings with the following additions/corrections:  A little more tired today.  She reports feeling "not quite right", but cannot quite describe why.  More wheezing on exam today, she has been declining all breathing treatments.  We discussed this, and she would prefer to not do any breathing treatments at this time, but I asked her to consider at least allowing [place] to give a longer acting bronchodilator for her.  She also said the furosemide seem to make her worse yesterday, although she was not clear exactly how.  Her JVP is still elevated, but improved from yesterday, and we will hold off on further diuresis for today.  Today is day 6 of cefepime and day 5 of azithromycin.  We will stop azithromycin, but likely continue cefepime for 1 more day.  Procalcitonin has decreased significantly, but the severity of her illness and procalcitonin still greater than 1, would prefer at least 1 more day of antibiotics.  She takes prednisone 10 mg daily at baseline.  Given her persistent wheezing and severity of illness, we will continue a slow prednisone taper back down to her usual 10 mg.  Lenice Pressman, M.D., Ph.D. 10/23/2018, 2:10 PM

## 2018-10-23 NOTE — Care Management Important Message (Signed)
Important Message  Patient Details  Name: Linda Ray MRN: [id number] Date of Birth: April 26, 1934   Medicare Important Message Given:  Yes     Orbie Pyo 10/23/2018, 4:15 PM

## 2018-10-23 NOTE — TOC Initial Note (Signed)
Transition of Care [facility]) - Initial/Assessment Note    Patient Details  Name: Linda Ray MRN: [id number] Date of Birth: 1934-11-18  [facility] CM/SW Contact:    Benard Halsted, LCSW Phone Number: 10/23/2018, 9:48 AM  Clinical Narrative:                 CSW received consult for possible SNF placement at time of discharge. CSW spoke with patient regarding PT recommendation of SNF placement at time of discharge. Patient reported that her daughter lives with her and she would prefer to go home rather than SNF. She is aware of her mobility limitations. She is accepting of home health services, though she has never had any before. CSW will arrange services with [facility]. CSW confirmed her PCP, address, and Pulmonologist. She reports that she does not have oxygen at home. She denies need for equipment and has a walker and bedside commode at home. Patient expressed being hopeful to feel better soon. No further questions reported at this time. CSW to continue to follow and assist with discharge planning needs.   Expected Discharge Plan: [place] Barriers to Discharge: Continued Medical Work up   Patient Goals and CMS Choice Patient states their goals for this hospitalization and ongoing recovery are:: Return home CMS Medicare.gov Compare Post Acute Care list provided to:: Patient Choice offered to / list presented to : Patient  Expected Discharge Plan and Services Expected Discharge Plan: [place] In-house Referral: NA Discharge Planning Services: CM Consult Post Acute Care Choice: [place] arrangements for the past 2 months: Single Family Home                 DME Arranged: N/A DME Agency: NA       HH Arranged: PT, RN, OT, Refused SNF [place] Agency: [place] Date [facility] Agency Contacted: 10/23/18 Time HH Agency Contacted: 0945 Representative spoke with at [place]: Georgina Snell  Prior Living Arrangements/Services Living  arrangements for the past 2 months: [place] Lives with:: Adult Children Patient language and need for interpreter reviewed:: Yes Do you feel safe going back to the place where you live?: Yes      Need for Family Participation in Patient Care: No (Comment) Care giver support system in place?: Yes (comment) Current home services: DME Criminal Activity/Legal Involvement Pertinent to Current Situation/Hospitalization: No - Comment as needed  Activities of Daily Living Home Assistive Devices/Equipment: Eyeglasses, Built-in shower seat, CBG Meter, Grab bars in shower, Grab bars around toilet, Nebulizer, Shower chair with back, Environmental consultant (specify type) ADL Screening (condition at time of admission) Patient's cognitive ability adequate to safely complete daily activities?: Yes Is the patient deaf or have difficulty hearing?: No Does the patient have difficulty seeing, even when wearing glasses/contacts?: No Does the patient have difficulty concentrating, remembering, or making decisions?: No Patient able to express need for assistance with ADLs?: Yes Does the patient have difficulty dressing or bathing?: Yes Independently performs ADLs?: No Communication: Independent Dressing (OT): Needs assistance Is this a change from baseline?: Change from baseline, expected to last <3days Grooming: Independent Feeding: Independent Bathing: Needs assistance Is this a change from baseline?: Change from baseline, expected to last <3 days Toileting: Needs assistance Is this a change from baseline?: Change from baseline, expected to last <3 days In/Out Bed: Needs assistance Is this a change from baseline?: Change from baseline, expected to last <3 days Does the patient have difficulty walking  or climbing stairs?: Yes Weakness of Legs: Both Weakness of Arms/Hands: None  Permission Sought/Granted Permission sought to share information with : Facility Art therapist granted to share  information with : Yes, Verbal Permission Granted     Permission granted to share info w AGENCY: Home Health        Emotional Assessment Appearance:: Appears stated age Attitude/Demeanor/Rapport: Other (comment)(Appropriate) Affect (typically observed): Accepting, Appropriate Orientation: : Oriented to Self, Oriented to Place, Oriented to  Time, Oriented to Situation Alcohol / Substance Use: Not Applicable Psych Involvement: No (comment)  Admission diagnosis:  Respiratory distress Patient Active Problem List   Diagnosis Date Noted  . Malnutrition of moderate degree 10/20/2018  . COPD with acute exacerbation ([place]) 10/18/2018  . Iron deficiency anemia 10/18/2018  . Thrombocytosis ([place]) 10/18/2018  . Acute CHF (congestive heart failure) ([place]) 10/18/2018  . Pneumonia 06/12/2018  . Hyponatremia 06/10/2018  . Rash 06/10/2018  . Myalgia 06/10/2018  . Sinus tachycardia 06/10/2018  . UTI (urinary tract infection) 06/10/2018  . COPD, moderate ([place]) 07/27/2015  . Smoking history 05/31/2015  . Incidental lung nodule, > 23mm and < 92mm 05/31/2015  . Other fatigue 05/31/2015  . Breast cancer, left breast - lobular T2N0 12/05/2010   PCP:  Vicenta Aly, FNP Pharmacy:   [id number] - [place], [place] - [address] [place] [address] Phone: [phone] Fax: [phone]     Social Determinants of Health (SDOH) Interventions    Readmission Risk Interventions Readmission Risk Prevention Plan 10/23/2018  Transportation Screening Complete  PCP or Specialist Appt within 3-5 Days Complete  HRI or [place] Complete  Social Work Consult for [place] Planning/Counseling Complete  Palliative Care Screening Not Applicable  Medication Review Press photographer) Complete  Some recent data might be hidden

## 2018-10-23 NOTE — Progress Notes (Signed)
RT NOTE: Patient refused her nebulizer treatment and DPI. She says does not take them at home and does not need them. Vitals are stable. RT will continue to monitor.

## 2018-10-24 LAB — GLUCOSE, CAPILLARY
Glucose-Capillary: 123 mg/dL — ABNORMAL HIGH (ref 70–99)
Glucose-Capillary: 123 mg/dL — ABNORMAL HIGH (ref 70–99)
Glucose-Capillary: 187 mg/dL — ABNORMAL HIGH (ref 70–99)
Glucose-Capillary: 197 mg/dL — ABNORMAL HIGH (ref 70–99)
Glucose-Capillary: 263 mg/dL — ABNORMAL HIGH (ref 70–99)

## 2018-10-24 LAB — COMPREHENSIVE METABOLIC PANEL
ALT: 38 U/L (ref 0–44)
AST: 24 U/L (ref 15–41)
Albumin: 2.8 g/dL — ABNORMAL LOW (ref 3.5–5.0)
Alkaline Phosphatase: 72 U/L (ref 38–126)
Anion gap: 10 (ref 5–15)
BUN: 43 mg/dL — ABNORMAL HIGH (ref 8–23)
CO2: 29 mmol/L (ref 22–32)
Calcium: 9.1 mg/dL (ref 8.9–10.3)
Chloride: 97 mmol/L — ABNORMAL LOW (ref 98–111)
Creatinine, Ser: 0.93 mg/dL (ref 0.44–1.00)
GFR calc Af Amer: >60 mL/min (ref >60)
GFR calc non Af Amer: 57 mL/min — ABNORMAL LOW (ref >60)
Glucose, Bld: 170 mg/dL — ABNORMAL HIGH (ref 70–99)
Potassium: 4.8 mmol/L (ref 3.5–5.1)
Sodium: 136 mmol/L (ref 135–145)
Total Bilirubin: 0.6 mg/dL (ref 0.3–1.2)
Total Protein: 5.4 g/dL — ABNORMAL LOW (ref 6.5–8.1)

## 2018-10-24 LAB — CBC
HCT: 37.4 % (ref 36.0–46.0)
Hemoglobin: 11.7 g/dL — ABNORMAL LOW (ref 12.0–15.0)
MCH: 28.3 pg (ref 26.0–34.0)
MCHC: 31.3 g/dL (ref 30.0–36.0)
MCV: 90.6 fL (ref 80.0–100.0)
Platelets: 596 10*3/uL — ABNORMAL HIGH (ref 150–400)
RBC: 4.13 MIL/uL (ref 3.87–5.11)
RDW: 16.9 % — ABNORMAL HIGH (ref 11.5–15.5)
WBC: 14.3 10*3/uL — ABNORMAL HIGH (ref 4.0–10.5)
nRBC: 0 % (ref 0.0–0.2)

## 2018-10-24 LAB — PROCALCITONIN: Procalcitonin: 0.42 ng/mL

## 2018-10-24 MED ORDER — INSULIN ASPART 100 UNIT/ML ~~LOC~~ SOLN
2.0000 [IU] | Freq: Three times a day (TID) | SUBCUTANEOUS | Status: DC
  Administered 2018-10-25 – 2018-10-26 (×3): 2 [IU] via SUBCUTANEOUS

## 2018-10-24 NOTE — Progress Notes (Signed)
Occupational Therapy Treatment Patient Details Name: Linda Ray MRN: [id number] DOB: 1934-03-14 Today's Date: 10/24/2018    History of present illness 84 year old woman with a history of breast cancer, COPD, iron deficiency anemia, DM, restless leg syndrome, and likely PMR admitted with acute shortness of breath. In ED pt in significant respiratory distress and was started on BiPAP. Admitted 10/18/18 for treatment of Acute hypoxic and hypercarbic respiratory failure.   OT comments  Pt making good progress with functional goals. Pt at min  A level with LB ADLs and min - min guard A with mobiliyt for SPTs to [facility]. Pt limited by fatigue this session. O2 SATs remained >88% with ADL activity.  OT will continue to follow acutely  Follow Up Recommendations  SNF;Supervision/Assistance - 24 hour    Equipment Recommendations  None recommended by OT    Recommendations for Other Services      Precautions / Restrictions Precautions Precaution Comments: monitor O2 Restrictions Weight Bearing Restrictions: No       Mobility Bed Mobility Overal bed mobility: Needs Assistance Bed Mobility: Supine to Sit;Sit to Supine     Supine to sit: Supervision Sit to supine: Supervision   General bed mobility comments: increased time  Transfers Overall transfer level: Needs assistance Equipment used: 1 person hand held assist Transfers: Sit to/from Stand;Stand Pivot Transfers Sit to Stand: Min assist Stand pivot transfers: Min guard            Balance Overall balance assessment: Needs assistance Sitting-balance support: No upper extremity supported;Feet supported Sitting balance-Leahy Scale: Fair     Standing balance support: Bilateral upper extremity supported;During functional activity Standing balance-Leahy Scale: Poor                             ADL either performed or assessed with clinical judgement   ADL Overall ADL's : Needs assistance/impaired Eating/Feeding:  Independent;Sitting   Grooming: Set up;Supervision/safety;Sitting;Wash/dry hands;Wash/dry face   Upper Body Bathing: Supervision/ safety;Set up;Sitting   Lower Body Bathing: Minimal assistance;Min guard;Sitting/lateral leans;Sit to/from stand   Upper Body Dressing : Set up;Supervision/safety;Sitting   Lower Body Dressing: Minimal assistance;Sit to/from stand   Toilet Transfer: Minimal assistance;Min guard;BSC;Stand-pivot   Toileting- Clothing Manipulation and Hygiene: Minimal assistance;Sit to/from stand       Functional mobility during ADLs: Minimal assistance;Min guard;Cueing for safety       Vision       Perception     Praxis      Cognition Arousal/Alertness: Awake/alert Behavior During Therapy: WFL for tasks assessed/performed Overall Cognitive Status: Within Functional Limits for tasks assessed                                          Exercises     Shoulder Instructions       General Comments      Pertinent Vitals/ Pain       Pain Assessment: No/denies pain  Home Living Family/patient expects to be discharged to:: Skilled nursing facility Living Arrangements: Children Available Help at Discharge: Family;Available PRN/intermittently Type of Home: House Home Access: Stairs to enter [employer] of Steps: 4 Entrance Stairs-Rails: Right Home Layout: One level     Bathroom Shower/Tub: Tub/shower unit;Walk-in shower   Bathroom Toilet: Standard     Home Equipment: Environmental consultant - 4 wheels;Bedside commode;Shower seat   Additional Comments: dtr has moved in  with her      Prior Functioning/Environment Level of Independence: Independent with assistive device(s)            Frequency  Min 2X/week        Progress Toward Goals  OT Goals(current goals can now be found in the care plan section)     Acute Rehab OT Goals Patient Stated Goal: go home OT Goal Formulation: With patient  Plan      Co-evaluation                  AM-PAC OT "6 Clicks" Daily Activity     Outcome Measure   Help from another person eating meals?: None Help from another person taking care of personal grooming?: A Little Help from another person toileting, which includes using toliet, bedpan, or urinal?: A Little Help from another person bathing (including washing, rinsing, drying)?: A Little   Help from another person to put on and taking off regular lower body clothing?: A Little 6 Click Score: 16    End of Session Equipment Utilized During Treatment: Oxygen;Gait belt;Other (comment)(BSC)  OT Visit Diagnosis: Unsteadiness on feet (R26.81);Other abnormalities of gait and mobility (R26.89);Muscle weakness (generalized) (M62.81)   Activity Tolerance Patient limited by fatigue   Patient Left in bed;with call bell/phone within reach   Nurse Communication          Time: 1035-1106 OT Time Calculation (min): 31 min  Charges: OT General Charges $OT Visit: 1 Visit OT Treatments $Self Care/Home Management : 8-22 mins $Therapeutic Activity: 8-22 mins     Britt Bottom 10/24/2018, 2:43 PM

## 2018-10-24 NOTE — Progress Notes (Signed)
  Date: 10/24/2018  Patient name: Linda Ray  Medical record number: [id number]  Date of birth: [birth year]   I have seen and evaluated this patient and I have discussed the plan of care with the house staff. Please see their note for complete details. I concur with their findings with the following additions/corrections:   84 year old woman admitted with hypercarbic and hypoxemic respiratory failure due to bilateral CAP, accompanying COPD exacerbation, and new diagnosis HFrEF.  Breathing continues to significantly improve.  However, she is quite deconditioned from spending a week in the hospital. -Stopping antibiotics today after 7 days -Begin prednisone taper, down to 30 mg daily -She continues to refuse bronchodilators, but she may be agreeable to a lama/laba -Volume status looks okay today, will continue to hold furosemide for now  At her PCP visit the day of admission, they had begun to discuss hospice referral for her as she has advanced COPD and iron deficiency anemia but no desire to pursue colonoscopy for further evaluation as she would not be interested in chemotherapy, radiation, or other invasive therapies.  She is interested in speaking with palliative care to discuss options prior to discharge, but is too tired to speak with them today.  Although her respiratory status is significantly improved, she will need to work on her deconditioning before she is ready to return home.  Hopeful for discharge in the next couple days.  Lenice Pressman, M.D., Ph.D. 10/24/2018, 6:05 PM

## 2018-10-24 NOTE — Progress Notes (Signed)
Subjective: Patient evaluated at bedside this morning. Tachycardia and tachypnea improved. Breathing comfortably on room air. She appears to be in no acute distress however reports that she is weak and does not think she is ready to return home. Reports having difficulty getting out of bed to the bedside commode.  Of note, she has been declining breathing treatments as she does not notice any benefit from them.  Objective:  Vital signs in last 24 hours: Vitals:   10/24/18 0512 10/24/18 0800 10/24/18 0833 10/24/18 0834  BP:   (!) 146/127   Pulse:  97 93   Resp:  19 14   Temp: 97.8 F (36.6 C)   97.6 F (36.4 C)  TempSrc: Oral   Oral  SpO2:  96% 100%   Weight:      Height:       Physical Exam Constitutional:      General: She is not in acute distress. HENT:     Head: Normocephalic and atraumatic.  Cardiovascular:     Rate and Rhythm: Normal rate and regular rhythm.     Pulses: Normal pulses.     Heart sounds: Normal heart sounds. No murmur. No friction rub. No gallop.   Pulmonary:     Effort: Pulmonary effort is normal. No tachypnea or respiratory distress.     Comments: Diffuse expiratory wheezing in all lung fields, improving  Abdominal:     General: Bowel sounds are normal.     Palpations: Abdomen is soft. There is no mass.     Tenderness: There is no abdominal tenderness.  Skin:    General: Skin is warm and dry.  Neurological:     General: No focal deficit present.     Mental Status: She is alert.     Cranial Nerves: No cranial nerve deficit.     Motor: No weakness.  Psychiatric:        Mood and Affect: Mood normal.        Behavior: Behavior normal.      Assessment/Plan:  84 yo female with PMHx of COPD, iron deficiency anemia, and borderline DMand polyarthropathy concerning for PMRpresenting with sudden onset shortness of breathfor requiring NIPPV. Since then, patient has been weaned off and is saturating well on minimal HFNC.   Sepsissecondary to  [employer] AcquiredPneumonia: Patient with history of COPD with sudden onset shortness of breath requiring BiPAP.Patient presented with respiratory distress with hypoxia on arrival, placed on BiPAP with improvement in symptoms. She is now breathing comfortably on room air. Patient remains afebrile. Tachycardia and tachypnea improved.PNT61.4 with procalcitonin trending down. Patient has completed 5 days of azithromycin and 6 days of cefepime. Will discontinue azithromycin at this point and cefepime to be d/c'd after dose this evening.  Blood cultures negative to date.   Acute hypoxic and hypercarbic respiratory failure: Patient with history of COPD and chronic tobacco use with sudden onset dyspnea requiring BiPAP due to respiratory distress and hypoxia withABG:pH7.214; pCO263.7;HCO3 25.3, pO2100.She is refusing breathing treatments as she does not have any immediate benefits. She continues to have persistent expiratory wheezing on auscultation. Will slowly taper prednisone to baseline of 64m (start prednisone 327mqd tomorrow).    -Prednisone 30 mg qd - Holding lasix today (see below) - Continue to monitor respiratory status  Heart failure with reduced ejection fraction:  Patient with elevated BNP, JVP on examination and cardiomegaly on examination. Echo with EF 30-35% with LV apical and anteroseptal akinesis and atrial enlargement. She reports that she became more  restless after the furosemide previously.  This morning, volume status improved. Will hold diuresis for now.  Iron deficiency anemia:  Patient with chronic anemia, previously thought to be anemia of chronic disease but recent iron studies in April 2020 consistent with iron deficiency anemia (Fe: 10, TIBC 177, %Sat: 6, Ferritin 309). She has had two sessions of Injectafer in June 2020. Found to be FOBT +. Previous colonoscopy in 2011 was normal. However, patient has had 15lb unintentional weight loss and fatigue over past  few months. On chart review, patient seen by GI and does not want to undergo further evaluation for colonic neoplasm at this time. She denies any GI symptoms at this time. Hb remains stable.   - Continue to monitor  Polyarthropathy:  Patient has had trouble standing from sitting position for few months with intermittent rash. She was noted to have elevated WBCs, ESR and MPO on previous admissions and referred to rheumatology. She was diagnosed with polyarthropathy with suspicion of polymyalgia rheumatica. She was started on low dose prednisone with resulting improvement in symptoms. - Continue prednisone77m qd - Continue to monitor   Type II DM: Patient with Hx of Type 2 DM with recent hyperglycemia due to prednisone. - SSI  - CBG monitoring  Diet:Heart Healthy DVT Prophylaxis:Lovenox Code: DNR/DNI  Dispo: Anticipated discharge in approximately 2-3 day(s).   MJeanmarie Hubert MD  Internal Medicine PGY1  10/24/2018, 1:09 PM Pager: [phone]

## 2018-10-25 ENCOUNTER — Other Ambulatory Visit: Payer: Self-pay

## 2018-10-25 DIAGNOSIS — R195 Other fecal abnormalities: Secondary | ICD-10-CM

## 2018-10-25 DIAGNOSIS — R7303 Prediabetes: Secondary | ICD-10-CM

## 2018-10-25 LAB — COMPREHENSIVE METABOLIC PANEL
ALT: 41 U/L (ref 0–44)
AST: 23 U/L (ref 15–41)
Albumin: 2.8 g/dL — ABNORMAL LOW (ref 3.5–5.0)
Alkaline Phosphatase: 63 U/L (ref 38–126)
Anion gap: 9 (ref 5–15)
BUN: 38 mg/dL — ABNORMAL HIGH (ref 8–23)
CO2: 28 mmol/L (ref 22–32)
Calcium: 9.1 mg/dL (ref 8.9–10.3)
Chloride: 101 mmol/L (ref 98–111)
Creatinine, Ser: 0.97 mg/dL (ref 0.44–1.00)
GFR calc Af Amer: >60 mL/min (ref >60)
GFR calc non Af Amer: 54 mL/min — ABNORMAL LOW (ref >60)
Glucose, Bld: 99 mg/dL (ref 70–99)
Potassium: 5.3 mmol/L — ABNORMAL HIGH (ref 3.5–5.1)
Sodium: 138 mmol/L (ref 135–145)
Total Bilirubin: 0.7 mg/dL (ref 0.3–1.2)
Total Protein: 5.5 g/dL — ABNORMAL LOW (ref 6.5–8.1)

## 2018-10-25 LAB — GLUCOSE, CAPILLARY
Glucose-Capillary: 88 mg/dL (ref 70–99)
Glucose-Capillary: 208 mg/dL — ABNORMAL HIGH (ref 70–99)
Glucose-Capillary: 245 mg/dL — ABNORMAL HIGH (ref 70–99)
Glucose-Capillary: 350 mg/dL — ABNORMAL HIGH (ref 70–99)

## 2018-10-25 LAB — CBC
HCT: 36.4 % (ref 36.0–46.0)
Hemoglobin: 11.6 g/dL — ABNORMAL LOW (ref 12.0–15.0)
MCH: 28.9 pg (ref 26.0–34.0)
MCHC: 31.9 g/dL (ref 30.0–36.0)
MCV: 90.5 fL (ref 80.0–100.0)
Platelets: 546 10*3/uL — ABNORMAL HIGH (ref 150–400)
RBC: 4.02 MIL/uL (ref 3.87–5.11)
RDW: 17.2 % — ABNORMAL HIGH (ref 11.5–15.5)
WBC: 14 10*3/uL — ABNORMAL HIGH (ref 4.0–10.5)
nRBC: 0 % (ref 0.0–0.2)

## 2018-10-25 NOTE — Progress Notes (Signed)
  Date: 10/25/2018  Patient name: Linda Ray  Medical record number: [id number]  Date of birth: [birth year]        I have seen and evaluated this patient and I have discussed the plan of care with the house staff. Please see their note for complete details. I concur with their findings with the following additions/corrections: Ms. Twardzik was seen this morning on team rounds.  Dr. Maudie Flakes note to come shortly.  She is deconditioned but currently requiring no inpatient interventions.  She is not interested in SNF.  She was able to sit up in bed multiple times spontaneously without assistance.  We did not watch her stand or walk.  Occupational Therapy worked with her today and she required 1 person hand-held assist to transfer.  Patient's biggest concern is being able to use the toilet.  She has a bedside commode and a shower seat.  She has a 1 level home and her daughter will be able to live with her when she goes home.  Possible discharge today versus August 30 to home with home health services.  Bartholomew Crews, MD 10/25/2018, 2:20 PM

## 2018-10-25 NOTE — Progress Notes (Addendum)
Subjective: Patient reports breathing well but still doesn't feel back to her old self.  We discussed this will take some time given how ill she was when she arrived.  She is still concerned about going home but is unsure why.  She mentions it may be difficult to get to the bathroom in time.  I ask how she does this hear and she states she can stand and get to the bedside commode.  I offer to give her a bedside commode to leave with and she states she has one at home already.  She is still adamant no SNF.    Objective:  Vital signs in last 24 hours: Vitals:   10/24/18 2253 10/25/18 0332 10/25/18 0517 10/25/18 1303  BP: (!) 146/68  (!) 151/80 137/64  Pulse: 87  93 91  Resp:    16  Temp: (!) 97.5 F (36.4 C)  98.1 F (36.7 C)   TempSrc: Oral  Oral   SpO2: 94%  96% 96%  Weight:  47.7 kg    Height:       Cardiac: JVD 9-10, normal rate and rhythm, clear s1 and s2, no murmurs, rubs or gallops, no LE edema Pulmonary: late inspiratory wheezing bilaterally, not in distress Abdominal: non distended abdomen, soft and nontender Psych: Alert, conversant   Assessment/Plan:  84 yo female with PMHx of COPD, iron deficiency anemia, and borderline DMand polyarthropathy concerning for PMRpresenting with sudden onset shortness of breathfor requiring NIPPV. Since then, patient has been weaned off and is saturating well on minimal HFNC.   Sepsissecondary to [employer] AcquiredPneumonia: Patient with history of COPD with sudden onset shortness of breath requiring BiPAP.Patient presented with respiratory distress with hypoxia on arrival, placed on BiPAP with improvement in symptoms. She is now breathing comfortably on room air. Patient remains afebrile. Tachycardia and tachypnea improved.LZJ67.3 with procalcitonin trending down. Patient has completed 5 days of azithromycin and 6 days of cefepime.  -off all abx, no further s/s of infection   Acute hypoxic and hypercarbic respiratory failure:  Patient with history of COPD and chronic tobacco use with sudden onset dyspnea requiring BiPAP due to respiratory distress and hypoxia withABG:pH7.214; pCO263.7;HCO3 25.3, pO2100.She is refusing breathing treatments as she does not have any immediate benefits. She continues to have persistent expiratory wheezing on auscultation. Will slowly taper prednisone to baseline of 2m (start prednisone 365mqd tomorrow). now on room air   -Prednisone 30 mg qd - Holding lasix today   Heart failure with reduced ejection fraction:  Patient with elevated BNP, JVP on examination and cardiomegaly on examination. Echo with EF 30-35% with LV apical and anteroseptal akinesis and atrial enlargement. She reports that she became more restless after the furosemide previously.  Volume status stable  -K higher today cannot add HFrEF therapy currently   Iron deficiency anemia:  Patient with chronic anemia, previously thought to be anemia of chronic disease but recent iron studies in April 2020 consistent with iron deficiency anemia (Fe: 10, TIBC 177, %Sat: 6, Ferritin 309). She has had two sessions of Injectafer in June 2020. Found to be FOBT +. Previous colonoscopy in 2011 was normal. However, patient has had 15lb unintentional weight loss and fatigue over past few months. On chart review, patient seen by GI and does not want to undergo further evaluation for colonic neoplasm at this time. She denies any GI symptoms at this time. Hb remains stable.   - Continue to monitor  Polyarthropathy:  Patient has had trouble standing from  sitting position for few months with intermittent rash. She was noted to have elevated WBCs, ESR and MPO on previous admissions and referred to rheumatology. She was diagnosed with polyarthropathy with suspicion of polymyalgia rheumatica. She was started on low dose prednisone with resulting improvement in symptoms. - Continue prednisone20m qd - Continue to monitor   Type II  DM: Patient with Hx of Type 2 DM with recent hyperglycemia due to prednisone. - SSI  - CBG monitoring  Diet:Heart Healthy DVT Prophylaxis:Lovenox Code: DNR/DNI  Dispo: Spoke with daughter today who is very reasonable and will provide reassurance to the patient and try to further understand her reluctance to return home. Family meeting today about disposition,  home with home health and hospice today vs tomorrow   WKatherine Roan MD  10/25/2018, 2:08 PM

## 2018-10-26 LAB — CBC
HCT: 37.1 % (ref 36.0–46.0)
Hemoglobin: 11.8 g/dL — ABNORMAL LOW (ref 12.0–15.0)
MCH: 28.6 pg (ref 26.0–34.0)
MCHC: 31.8 g/dL (ref 30.0–36.0)
MCV: 89.8 fL (ref 80.0–100.0)
Platelets: 512 10*3/uL — ABNORMAL HIGH (ref 150–400)
RBC: 4.13 MIL/uL (ref 3.87–5.11)
RDW: 17.2 % — ABNORMAL HIGH (ref 11.5–15.5)
WBC: 14.1 10*3/uL — ABNORMAL HIGH (ref 4.0–10.5)
nRBC: 0 % (ref 0.0–0.2)

## 2018-10-26 LAB — COMPREHENSIVE METABOLIC PANEL
ALT: 42 U/L (ref 0–44)
AST: 20 U/L (ref 15–41)
Albumin: 2.8 g/dL — ABNORMAL LOW (ref 3.5–5.0)
Alkaline Phosphatase: 63 U/L (ref 38–126)
Anion gap: 11 (ref 5–15)
BUN: 32 mg/dL — ABNORMAL HIGH (ref 8–23)
CO2: 29 mmol/L (ref 22–32)
Calcium: 9.2 mg/dL (ref 8.9–10.3)
Chloride: 99 mmol/L (ref 98–111)
Creatinine, Ser: 0.9 mg/dL (ref 0.44–1.00)
GFR calc Af Amer: >60 mL/min (ref >60)
GFR calc non Af Amer: 60 mL/min — ABNORMAL LOW (ref >60)
Glucose, Bld: 104 mg/dL — ABNORMAL HIGH (ref 70–99)
Potassium: 4.8 mmol/L (ref 3.5–5.1)
Sodium: 139 mmol/L (ref 135–145)
Total Bilirubin: 0.6 mg/dL (ref 0.3–1.2)
Total Protein: 5.2 g/dL — ABNORMAL LOW (ref 6.5–8.1)

## 2018-10-26 LAB — GLUCOSE, CAPILLARY
Glucose-Capillary: 91 mg/dL (ref 70–99)
Glucose-Capillary: 262 mg/dL — ABNORMAL HIGH (ref 70–99)

## 2018-10-26 MED ORDER — PREDNISONE 10 MG PO TABS: ORAL_TABLET | ORAL | 0 refills | Status: DC

## 2018-10-26 NOTE — Progress Notes (Signed)
Subjective: Patient examined this morning at bedside. She is in no acute distress but reports not being able to sleep well overnight as "people kept coming in". She is agreeable to going home today and does not need equipment.   Objective:  Vital signs in last 24 hours: Vitals:   10/25/18 1303 10/25/18 2135 10/26/18 0500 10/26/18 0526  BP: 137/64 138/61  (!) 154/95  Pulse: 91 85  95  Resp: 16 16    Temp:  97.6 F (36.4 C)  97.6 F (36.4 C)  TempSrc:  Oral    SpO2: 96% 100%  97%  Weight:   47.5 kg   Height:       Physical Exam Constitutional:      General: She is not in acute distress. Cardiovascular:     Rate and Rhythm: Normal rate and regular rhythm.     Pulses: Normal pulses.     Heart sounds: Normal heart sounds. No murmur. No friction rub. No gallop.   Pulmonary:     Effort: Pulmonary effort is normal. No tachypnea, accessory muscle usage or respiratory distress.     Comments: Minimal diffuse expiratory wheezing  Abdominal:     General: Bowel sounds are normal.     Palpations: Abdomen is soft.     Tenderness: There is no abdominal tenderness.  Musculoskeletal: Normal range of motion.  Neurological:     General: No focal deficit present.     Mental Status: She is alert and oriented to person, place, and time.      Assessment/Plan:  84 yo female with PMHx of COPD, iron deficiency anemia, and borderline DMand polyarthropathy concerning for PMRpresenting with sudden onset shortness of breathfor requiring NIPPV. Since then, patient has been weaned off and is saturating well on room air.   Sepsissecondary to [employer] AcquiredPneumonia: Patient with history of COPD with sudden onset shortness of breath requiring BiPAP.Patient presented with respiratory distress with hypoxia on arrival, placed on BiPAP with improvement in symptoms.She is now breathing comfortably on room air.Patient remains afebrile. Tachycardia and tachypnea improved. Procalcitonin trending  down. Patient has completed 5 days of azithromycin and 7 days of cefepime. -Off antibiotics, No s/s of infection  Acute hypoxic and hypercarbic respiratory failure: Patient with history of COPD and chronic tobacco use with sudden onset dyspnea requiring BiPAP due to respiratory distress and hypoxia withpresenting ABG:pH7.214; pCO263.7;HCO3 25.3, pO2100.She has minimal expiratory wheezing on auscultation. She is saturating well on room air. Will slowly taper prednisone to baseline of 12m.   -Prednisone 30 mg qd - Continue to hold lasix    Heart failure with reduced ejection fraction:  Patient with elevated BNP, JVPand cardiomegaly on presentation. Echo with EF 30-35% with LV apical and anteroseptal akinesis and atrial enlargement. Volume status stable.. K improved.   -Continue to hold Lasix   Iron deficiency anemia:  Patient with chronic anemia, previously thought to be anemia of chronic disease but recent iron studies in April 2020 consistent with iron deficiency anemia (Fe: 10, TIBC 177, %Sat: 6, Ferritin 309). She has had two sessions of Injectafer in June 2020. Found to be FOBT +. Previous colonoscopy in 2011 was normal. However, patient has had 15lb unintentional weight loss and fatigue over past few months. On chart review, patient seen by GI and does not want to undergo further evaluation for colonic neoplasm at this time. She denies any GI symptoms at this time. Hbremains stable.  - Continue to monitor  Polyarthropathy:  Patient has had trouble standing  from sitting position for few months with intermittent rash. She was noted to have elevated WBCs, ESR and MPO on previous admissions and referred to rheumatology. She was diagnosed with polyarthropathy with suspicion of polymyalgia rheumatica. She was started on low dose prednisone with resulting improvement in symptoms. - Continue prednisone93m qd - Continue to monitor   Type II DM: Patient with Hx of Type 2  DM with recent hyperglycemia due to prednisone.  - SSI  - CBG monitoring  Diet:Heart Healthy DVT Prophylaxis:Lovenox Code: DNR/DNI  Dispo: Anticipated discharge to home today with daughter.   AHarvie Heck MD  Internal Medicine PGY 1 10/26/2018, 7:35 AM Pager: [phone]

## 2018-10-26 NOTE — Progress Notes (Signed)
Patient was discharged home with home health by MD order; discharged instructions review and give to patient and her daughter with care notes and prescription; IV DIC; skin intact; patient will be escorted to the car by nurse via wheelchair.

## 2018-10-26 NOTE — Discharge Instructions (Signed)
Ms. Yomaira, [place] were admitted for respiratory failure due to COPD exacerbation secondary to pneumonia. You were treated with antibiotics, breathing treatments and steroids resulting in improvement in symptoms.   Please continue to take the prednisone 30mg  and taper the dose as instructed.   Please continue to use your albuterol inhaler 1-2 puffs every 6 hours as needed for shortness of breath or wheezing.   Please schedule a visit with your PCP for a hospital follow up and further goals of care discussion.   Contact us if you have any questions. Thank you!

## 2018-10-26 NOTE — Care Management (Signed)
Notified [facility] health that patient will DC today. No other CM needs identified.

## 2018-10-26 NOTE — Discharge Summary (Addendum)
Name: Linda Ray MRN: [id number] DOB: April 28, 1934 84 y.o. PCP: Vicenta Aly, FNP  Date of Admission: 10/18/2018  4:56 AM Date of Discharge:  Attending Physician: Oda Kilts, MD  Discharge Diagnosis: 1. Acute on chronic hypoxemic/hypercarbic respiratory failure 2.Heart failure with reduced EF  Discharge Medications: Allergies as of 10/26/2018      Reactions   Codeine Nausea And Vomiting   Sulfa Drugs Cross Reactors Other (See Comments)   unknown   Tetanus-diphth-acell Pertussis Other (See Comments)   Significant local reaction   Metformin And Related Diarrhea      Medication List    TAKE these medications   albuterol 108 (90 Base) MCG/ACT inhaler Commonly known as: VENTOLIN HFA Inhale 1-2 puffs into the lungs every 6 (six) hours as needed for wheezing or shortness of breath.   aspirin 81 MG tablet Take 81 mg by mouth daily.   B-complex with vitamin C tablet Take 1 tablet by mouth daily.   furosemide 20 MG tablet Commonly known as: LASIX Take 1 tablet (20 mg total) by mouth daily as needed for fluid or edema.   glimepiride 4 MG tablet Commonly known as: AMARYL Take 8 mg by mouth daily with lunch.   predniSONE 10 MG tablet Commonly known as: DELTASONE Please continue taking your prednisone in the following manner:  On 10/27/2018 and 10/28/2018 - take '30mg'$  of prednisone (3 tablets of '10mg'$ ) daily with breakfast. On 10/29/2018, 10/30/2018, and 10/31/2018 - take '20mg'$  of prednisone (2 tablets of '10mg'$ ) daily with breakfast On 11/01/2018 and beyond - take your home dose of prednisone '10mg'$  (1 tablet of '10mg'$ )  daily with breakfast What changed:   medication strength  how much to take  how to take this  additional instructions       Disposition and follow-up:   Ms.Linda Ray was discharged from [facility] in Stable condition.  At the hospital follow up visit please address:  Acute on chronic hypoxic and hypercarbic respiratory failure 2/2  PNA: Patient with PMHx of COPD presented with sudden onset dyspnea requiring NIPPV. PNA on CXR. Improved with continuous nebs, prednisone and antibiotics.  - f/u prednisone taper   Heart failure with reduced ejection fraction:  Patient with elevated BNP, JVP and cardiomegaly onpresentation. Echo with EF 30-35% with LV apical and anteroseptal akinesis and atrial enlargement.Volume status stable.   2.  Labs / imaging needed at time of follow-up: BMP, CBC  3.  Pending labs/ test needing follow-up: none   Follow-up Appointments: Follow-up Information    Care, [facility] Follow up.   Specialty: Home Health Services Why: [place] arranged. Contact information: [address]  [place] [address] [phone]           Hospital Course by problem list: Sepsissecondary to Community AcquiredPneumonia: Patient with history of COPD with sudden onset shortness of breath requiring BiPAP.Patient presented with respiratory distress with hypoxia on arrival, placed on BiPAP with improvement in symptoms.CXR with pneumonia (R>L). Patient completed 5 days of azithromycin and 7 days of cefepime with improvement in symptoms and procalcitonin trending down.   Acute hypoxic and hypercarbic respiratory failure: Patient with history of COPD and chronic tobacco use with sudden onset dyspnea requiring BiPAP due to respiratory distress and hypoxia withpresenting ABG:pH7.214; pCO263.7;HCO3 25.3, pO2100.On discharge, she is saturating well on room air. Will slowly taper prednisone to baseline of '10mg'$ .  Heart failure with reduced ejection fraction:  Patient with elevated BNP, JVPand cardiomegaly on presentation.  Echo with EF 30-35% with LV apical and anteroseptal akinesis and atrial enlargement. Volume status stable. Lasix prn  Iron deficiency anemia:  Patient with chronic anemia, previously thought to be anemia of chronic disease but recent iron studies in April  2020 consistent with iron deficiency anemia (Fe: 10, TIBC 177, %Sat: 6, Ferritin 309). She has had two sessions of Injectafer in June 2020. Found to be FOBT +. Previous colonoscopy in 2011 was normal. However, patient has had 15lb unintentional weight loss and fatigue over past few months. On chart review, patient seen by GI and does not want to undergo further evaluation for colonic neoplasm at this time. She denies any GI symptoms at this time. Hbstable during admission. Patient and daughter to pursue hospice care.    Polyarthropathy:  Patient has had trouble standing from sitting position for few months with intermittent rash. She was noted to have elevated WBCs, ESR and MPO on previous admissions and referred to rheumatology. She was diagnosed with polyarthropathy with suspicion of polymyalgia rheumatica. She was started on low dose prednisone with resulting improvement in symptoms.    Discharge Vitals:   BP (!) 119/58 (BP Location: Left Arm)   Pulse 88   Temp 98.1 F (36.7 C) (Oral)   Resp 18   Ht '5\' 1"'$  (1.549 m)   Wt 47.5 kg   SpO2 100%   BMI 19.79 kg/m   Pertinent Labs, Studies, and Procedures:   CXR 10/18/2018:  IMPRESSION: Increased left mid lung and right lower lung base patchy consolidation which may represent superimposed infectious process upon a background of chronic interstitial opacities.  CXR 10/19/2018:  IMPRESSION: Interval worsening right-greater-than-left heterogeneous opacities which may represent pneumonia or asymmetric edema.   Discharge Instructions: Discharge Instructions    (HEART FAILURE PATIENTS) Call MD:  Anytime you have any of the following symptoms: 1) 3 pound weight gain in 24 hours or 5 pounds in 1 week 2) shortness of breath, with or without a dry hacking cough 3) swelling in the hands, feet or stomach 4) if you have to sleep on extra pillows at night in order to breathe.   Complete by: As directed    Call MD for:  difficulty breathing,  headache or visual disturbances   Complete by: As directed    Call MD for:  extreme fatigue   Complete by: As directed    Call MD for:  persistant dizziness or light-headedness   Complete by: As directed    Call MD for:  persistant nausea and vomiting   Complete by: As directed    Call MD for:  severe uncontrolled pain   Complete by: As directed    Call MD for:  temperature >100.4   Complete by: As directed    Diet - low sodium heart healthy   Complete by: As directed    Increase activity slowly   Complete by: As directed       Signed: Harvie Heck, MD  Internal Medicine PGY 1 10/26/2018, 3:20 PM   Pager: [phone]

## 2019-04-24 ENCOUNTER — Ambulatory Visit: Payer: Medicare Other | Attending: Internal Medicine

## 2019-04-24 DIAGNOSIS — Z23 Encounter for immunization: Secondary | ICD-10-CM

## 2019-04-24 NOTE — Progress Notes (Signed)
   Covid-19 Vaccination Clinic  Name:  Linda Ray    MRN: [id number] DOB: [birth year]  04/24/2019  [name] was observed post Covid-19 immunization for 15 minutes without incidence. She was provided with Vaccine Information Sheet and instruction to access the V-Safe system.   [name] was instructed to call 911 with any severe reactions post vaccine: Marland Kitchen Difficulty breathing  . Swelling of your face and throat  . A fast heartbeat  . A bad rash all over your body  . Dizziness and weakness    Immunizations Administered    Name Date Dose VIS Date Route   Pfizer COVID-19 Vaccine 04/24/2019 12:56 PM 0.3 mL 02/06/2019 Intramuscular   Manufacturer: [place]   Lot: [id number]   [place]: [id number]

## 2019-05-19 ENCOUNTER — Ambulatory Visit: Payer: Medicare Other | Attending: Internal Medicine

## 2019-05-19 DIAGNOSIS — Z23 Encounter for immunization: Secondary | ICD-10-CM

## 2019-05-19 NOTE — Progress Notes (Signed)
   Covid-19 Vaccination Clinic  Name:  Linda Ray    MRN: [id number] DOB: Jan 07, 1935  05/19/2019  [name] was observed post Covid-19 immunization for 15 minutes without incident. She was provided with Vaccine Information Sheet and instruction to access the V-Safe system.   [name] was instructed to call 911 with any severe reactions post vaccine: Marland Kitchen Difficulty breathing  . Swelling of face and throat  . A fast heartbeat  . A bad rash all over body  . Dizziness and weakness   Immunizations Administered    Name Date Dose VIS Date Route   Pfizer COVID-19 Vaccine 05/19/2019  4:08 PM 0.3 mL 02/06/2019 Intramuscular   Manufacturer: [place]   Lot: [id number]   [place]: [id number]

## 2019-07-31 ENCOUNTER — Emergency Department (HOSPITAL_COMMUNITY)
Admission: EM | Admit: 2019-07-31 | Discharge: 2019-08-01 | Discharge status: Against Medical Advice | Payer: Medicare Other | Attending: Emergency Medicine | Admitting: Emergency Medicine

## 2019-07-31 ENCOUNTER — Encounter (HOSPITAL_COMMUNITY): Payer: Self-pay | Admitting: Emergency Medicine

## 2019-07-31 ENCOUNTER — Other Ambulatory Visit: Payer: Self-pay

## 2019-07-31 DIAGNOSIS — Z5321 Procedure and treatment not carried out due to patient leaving prior to being seen by health care provider: Secondary | ICD-10-CM | POA: Insufficient documentation

## 2019-07-31 DIAGNOSIS — R1084 Generalized abdominal pain: Secondary | ICD-10-CM | POA: Diagnosis not present

## 2019-07-31 LAB — COMPREHENSIVE METABOLIC PANEL WITH GFR
ALT: 17 U/L (ref 0–44)
AST: 20 U/L (ref 15–41)
Albumin: 3.5 g/dL (ref 3.5–5.0)
Alkaline Phosphatase: 51 U/L (ref 38–126)
Anion gap: 15 (ref 5–15)
BUN: 32 mg/dL — ABNORMAL HIGH (ref 8–23)
CO2: 23 mmol/L (ref 22–32)
Calcium: 9.7 mg/dL (ref 8.9–10.3)
Chloride: 103 mmol/L (ref 98–111)
Creatinine, Ser: 1.21 mg/dL — ABNORMAL HIGH (ref 0.44–1.00)
GFR calc Af Amer: 48 mL/min — ABNORMAL LOW
GFR calc non Af Amer: 41 mL/min — ABNORMAL LOW
Glucose, Bld: 264 mg/dL — ABNORMAL HIGH (ref 70–99)
Potassium: 4.1 mmol/L (ref 3.5–5.1)
Sodium: 141 mmol/L (ref 135–145)
Total Bilirubin: 0.6 mg/dL (ref 0.3–1.2)
Total Protein: 6.5 g/dL (ref 6.5–8.1)

## 2019-07-31 LAB — CBC
HCT: 46.1 % — ABNORMAL HIGH (ref 36.0–46.0)
Hemoglobin: 14.8 g/dL (ref 12.0–15.0)
MCH: 31.4 pg (ref 26.0–34.0)
MCHC: 32.1 g/dL (ref 30.0–36.0)
MCV: 97.9 fL (ref 80.0–100.0)
Platelets: 397 K/uL (ref 150–400)
RBC: 4.71 MIL/uL (ref 3.87–5.11)
RDW: 13.4 % (ref 11.5–15.5)
WBC: 19 K/uL — ABNORMAL HIGH (ref 4.0–10.5)
nRBC: 0 % (ref 0.0–0.2)

## 2019-07-31 LAB — LIPASE, BLOOD: Lipase: 18 U/L (ref 11–51)

## 2019-07-31 MED ORDER — SODIUM CHLORIDE 0.9% FLUSH: 3.0000 mL | Freq: Once | INTRAVENOUS | Status: DC

## 2019-07-31 NOTE — ED Triage Notes (Signed)
Patient arrives to ED with complaints of sudden generalized abdominal pain starting today that radiates to both sides of her back. Patient states that shes been nauseous since she woke up and has vomited 6 times. EMS have patient 8mg  Zofran and 500cc fluid. Patient continues to be nauseous.

## 2019-07-31 NOTE — ED Notes (Signed)
Pt has not answered called multiple times

## 2019-07-31 NOTE — ED Notes (Signed)
Pt has decided to go home

## 2019-11-07 DIAGNOSIS — N1831 Chronic kidney disease, stage 3a: Secondary | ICD-10-CM | POA: Insufficient documentation

## 2019-12-21 ENCOUNTER — Other Ambulatory Visit: Payer: Self-pay | Admitting: Nurse Practitioner

## 2019-12-21 DIAGNOSIS — R911 Solitary pulmonary nodule: Secondary | ICD-10-CM

## 2019-12-21 DIAGNOSIS — J449 Chronic obstructive pulmonary disease, unspecified: Secondary | ICD-10-CM

## 2020-01-06 ENCOUNTER — Ambulatory Visit
Admission: RE | Admit: 2020-01-06 | Discharge: 2020-01-06 | Disposition: A | Discharge status: Home / Self Care | Payer: Medicare Other | Source: Ambulatory Visit | Attending: Nurse Practitioner | Admitting: Nurse Practitioner

## 2020-01-06 DIAGNOSIS — R911 Solitary pulmonary nodule: Secondary | ICD-10-CM

## 2020-01-06 DIAGNOSIS — J449 Chronic obstructive pulmonary disease, unspecified: Secondary | ICD-10-CM

## 2020-03-03 IMAGING — CT CT HEAD WITHOUT CONTRAST
3 series · 15 of 47 positions shown, 18 images · non-contrast
Comparison: None.

CLINICAL DATA: Headache

EXAM:
CT HEAD WITHOUT CONTRAST
TECHNIQUE: Contiguous axial images were obtained from the base of the skull
through the vertex without intravenous contrast.

[Series 2: head wo · axial · 0.42mm/px · z∈[-150,-25]mm · 9 of 30 slices shown, 12 images]
[im 3/30  brain]
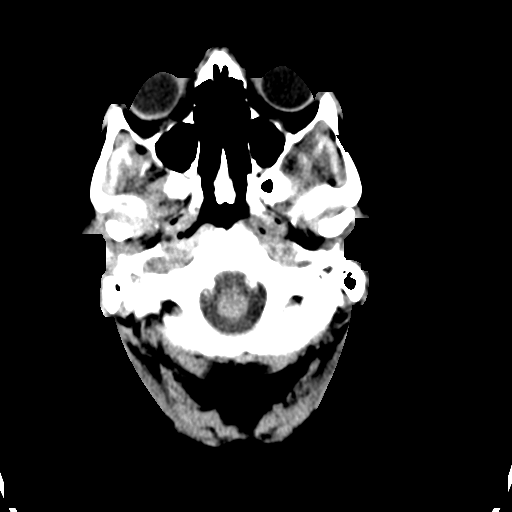
[im 3/30  bone]
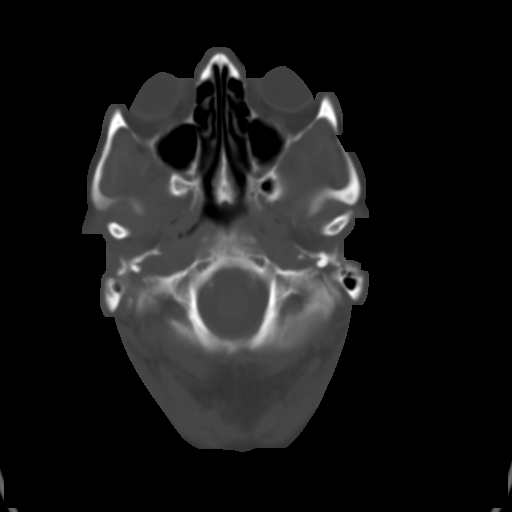
[im 6/30  brain]
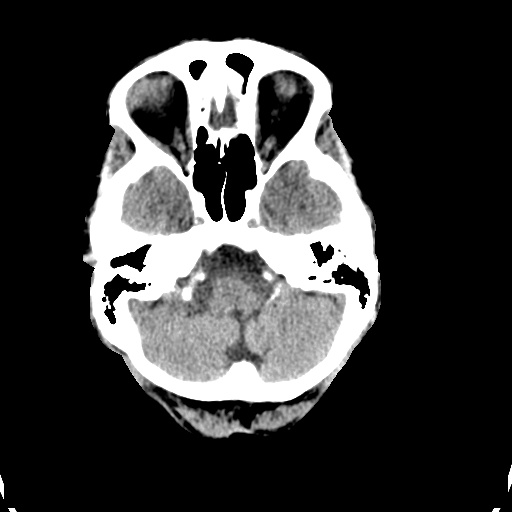
[im 9/30  brain]
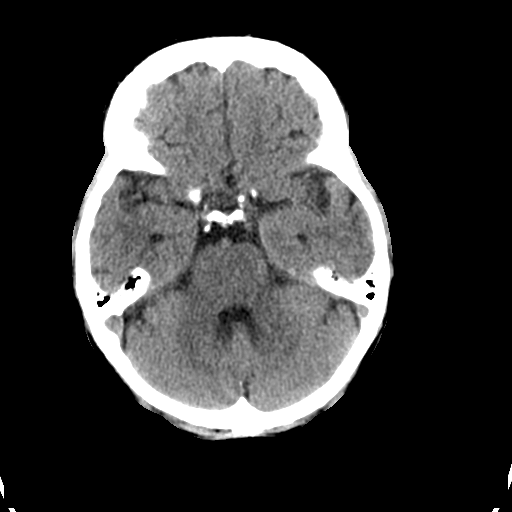
[im 12/30  brain]
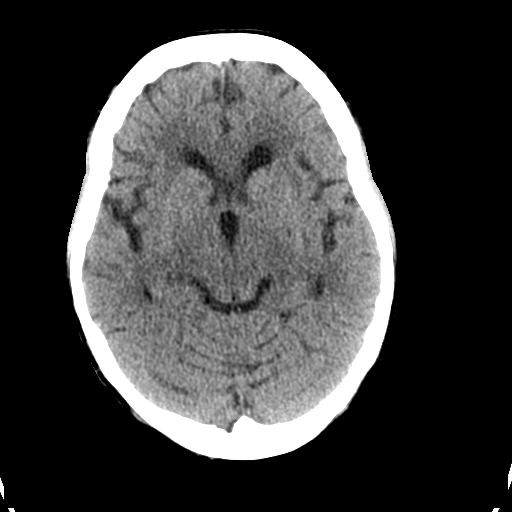
[im 16/30  brain]
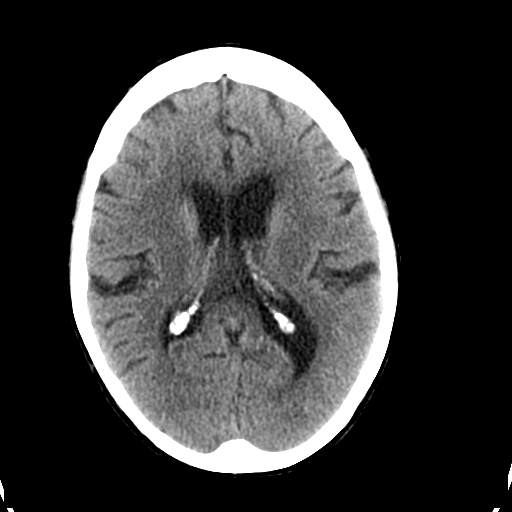
[im 16/30  bone]
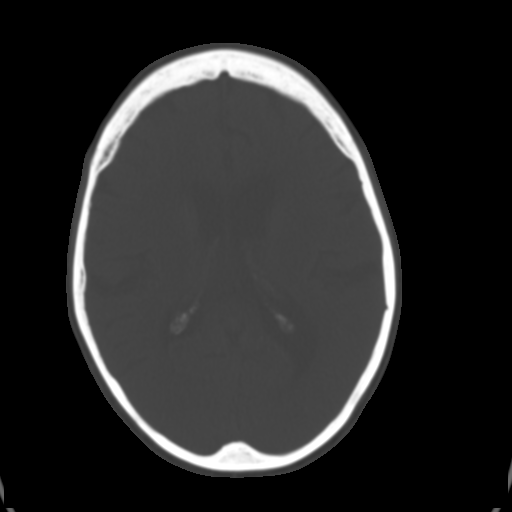
[im 19/30  brain]
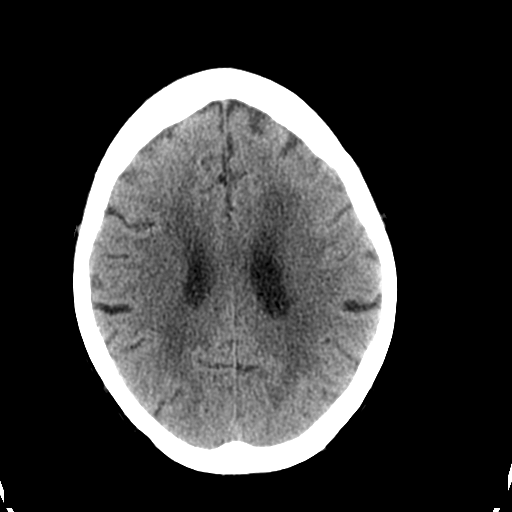
[im 22/30  brain]
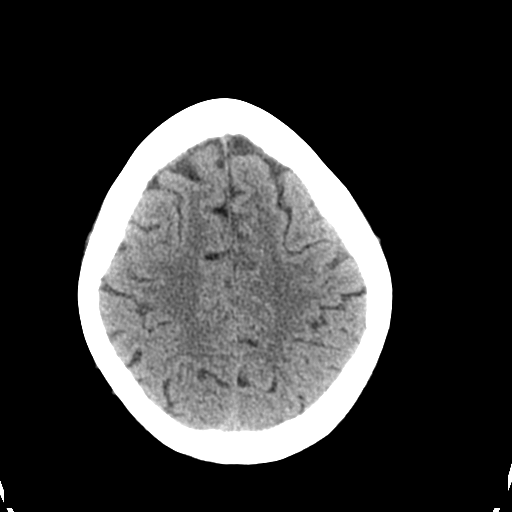
[im 25/30  brain]
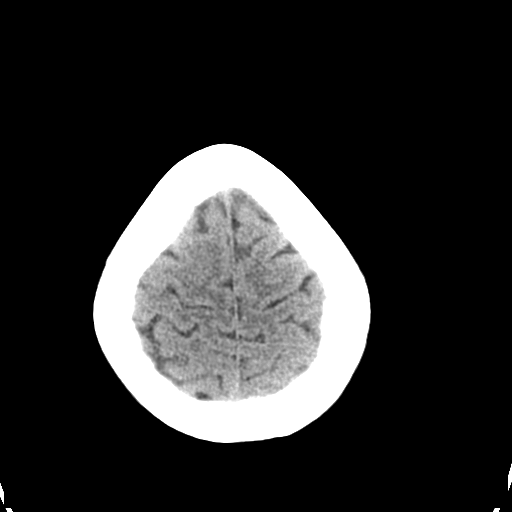
[im 28/30  brain]
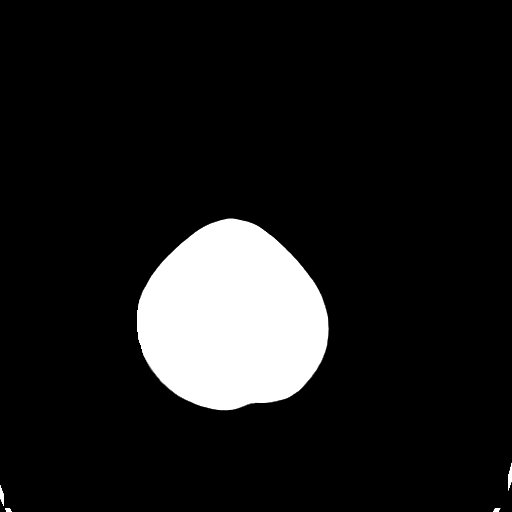
[im 28/30  bone]
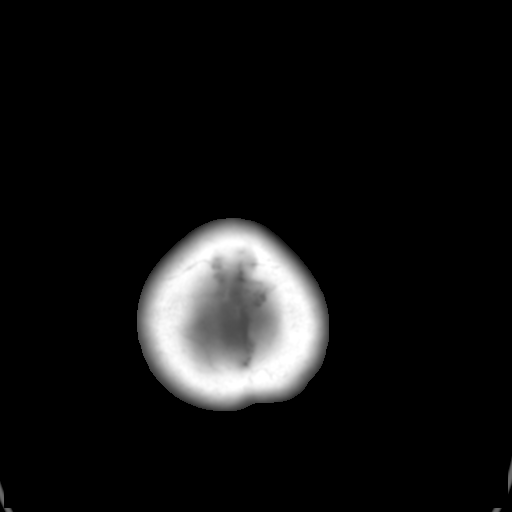

[Series 4: coronal soft tissue · coronal · 0.29mm/px · 3 of 80 slices shown]
[im 27/80  brain]
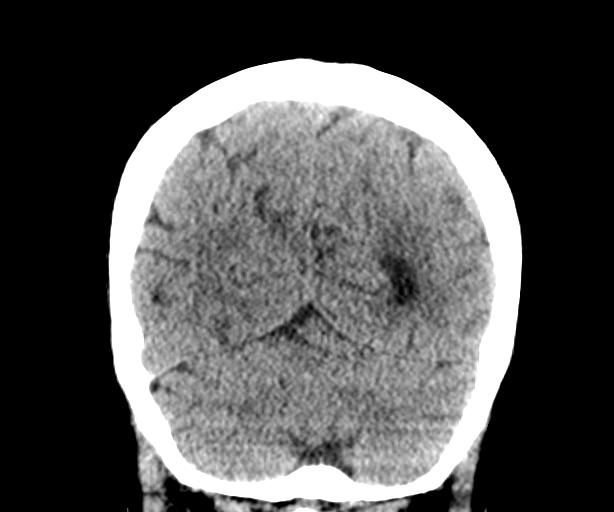
[im 36/80  brain]
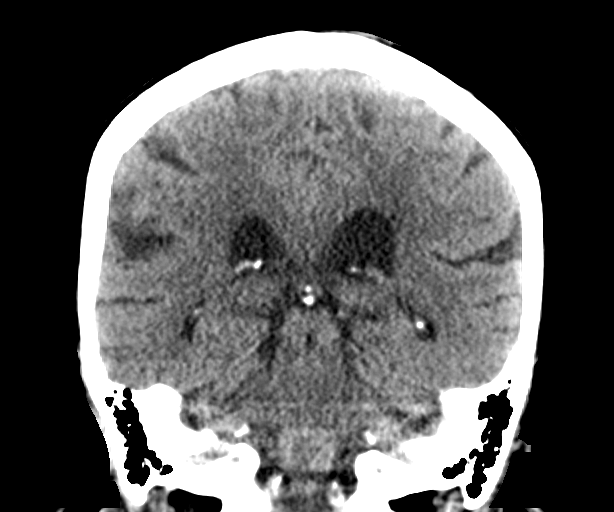
[im 44/80  brain]
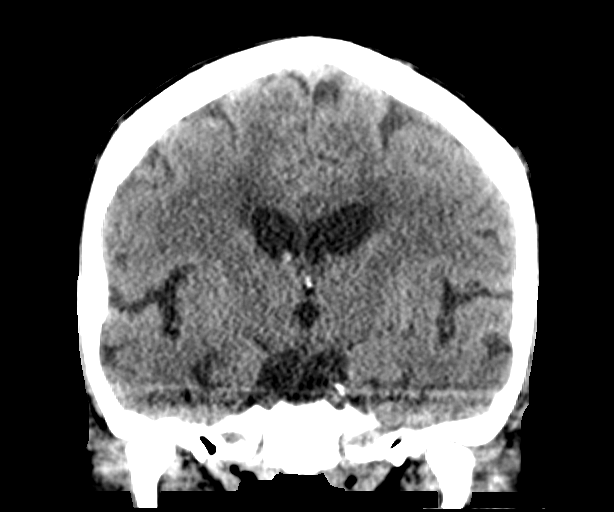

[Series 5: sagittal soft tissue · sagittal · 0.32mm/px · 3 of 53 slices shown]
[im 18/53  brain]
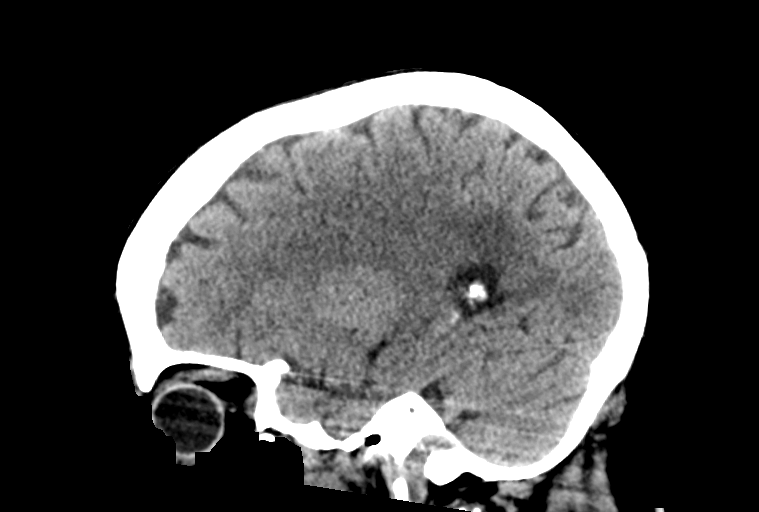
[im 27/53  brain]
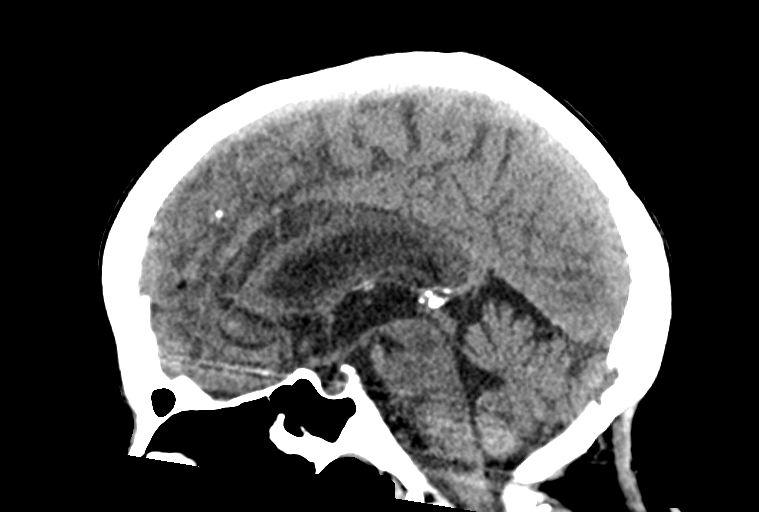
[im 35/53  brain]
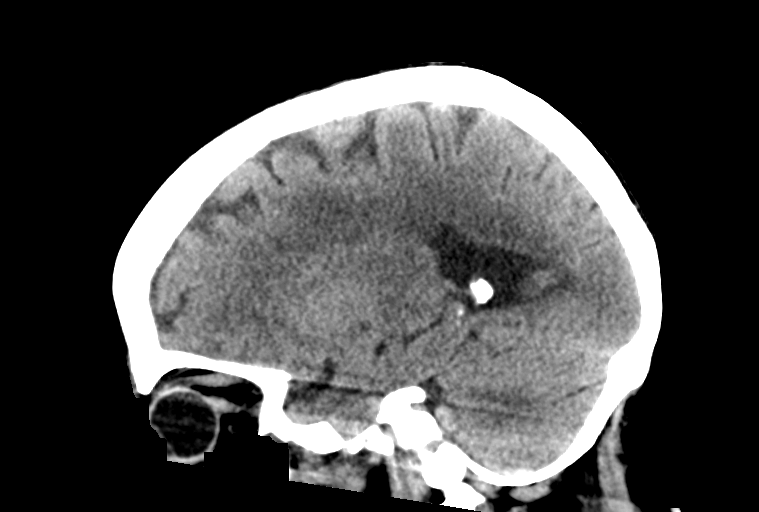

[15 of 47 positions shown; findings below may reference images not displayed]

FINDINGS: Brain: No acute territorial infarction, hemorrhage or intracranial
mass. Patchy hypodensity in the bilateral white matter. Mild
atrophy. Prominent ventricles, felt secondary to atrophy.

Vascular: No hyperdense vessels. Vertebral and carotid vascular
calcification

Skull: Normal. Negative for fracture or focal lesion.

Sinuses/Orbits: No acute finding.

Other: None
IMPRESSION: 1. No CT evidence for acute intracranial abnormality.
2. Atrophy and mild small vessel ischemic changes of the white
matter.

## 2020-03-16 ENCOUNTER — Encounter: Payer: Self-pay | Admitting: Cardiovascular Disease

## 2020-03-16 ENCOUNTER — Ambulatory Visit (INDEPENDENT_AMBULATORY_CARE_PROVIDER_SITE_OTHER): Payer: Medicare Other | Admitting: Cardiovascular Disease

## 2020-03-16 ENCOUNTER — Other Ambulatory Visit: Payer: Self-pay

## 2020-03-16 DIAGNOSIS — I739 Peripheral vascular disease, unspecified: Secondary | ICD-10-CM | POA: Diagnosis not present

## 2020-03-16 DIAGNOSIS — I10 Essential (primary) hypertension: Secondary | ICD-10-CM

## 2020-03-16 DIAGNOSIS — Z87891 Personal history of nicotine dependence: Secondary | ICD-10-CM | POA: Diagnosis not present

## 2020-03-16 NOTE — Assessment & Plan Note (Signed)
History of tobacco use having smoked over 70 pack years and currently smoking 1/2 pack/day.

## 2020-03-16 NOTE — Assessment & Plan Note (Signed)
Linda Ray was referred to me by Vicenta Aly, FNP for evaluation of symptomatic left upper extremity claudication.  This began approximately a month or 2 ago.  Her left hand turns intermittently blue.  She has pain in decree strength in her left arm.  She did have Doppler studies performed at [facility] 03/09/2020 that suggested left axillary stenosis.  [name] get a CTA to further evaluate.  Normally, stenosis in this area is consistent with a vasculitis.  There is no blood pressure audible in the left upper extremity.  I cannot feel a left radial or brachial pulse.

## 2020-03-16 NOTE — Progress Notes (Signed)
03/16/2020 [facility] [place]   12-17-1934  [id number]  Primary Physician Vicenta Aly, [place] Primary Cardiologist: Lorretta Harp MD Lupe Carney, [place]  HPI:  Linda Ray is a 86 y.o. thin appearing widowed Caucasian female mother of 2 biologic children, grandmother of 7 grandchildren is accompanied by her daughter Linda Ray today.  She was referred by Melvyn Novas, FNP, for peripheral vascular valuation because of left upper extremity claudication.  She is retired from being a Radiation protection practitioner.  Her risk factors include 70-pack-year tobacco abuse and treated hypertension.  She does have COPD.  She apparently saw Dr. Tamala Julian many years ago who did a heart catheterization that was normal.  She is never had a heart heart attack but apparently has had TIAs in the past.  She has COPD.  She has noticed left hand cyanosis's episodic over the last month or 2 with weakness and pain in her left upper extremity.  Dopplers performed at [facility] 03/09/2020 showed left axillary stenosis.   Current Meds  Medication Sig  . albuterol (PROVENTIL HFA;VENTOLIN HFA) 108 (90 Base) MCG/ACT inhaler Inhale 1-2 puffs into the lungs every 6 (six) hours as needed for wheezing or shortness of breath.  Marland Kitchen amLODipine (NORVASC) 2.5 MG tablet Take 2.5 mg by mouth daily.  Marland Kitchen aspirin 81 MG tablet Take 81 mg by mouth daily.  . B Complex-C (B-COMPLEX WITH VITAMIN C) tablet Take 1 tablet by mouth daily.     Allergies  Allergen Reactions  . Codeine Nausea And Vomiting  . Sulfa Drugs Cross Reactors Other (See Comments)    unknown  . Tetanus-Diphth-Acell Pertussis Other (See Comments)    Significant local reaction  . Metformin And Related Diarrhea    Social History   Socioeconomic History  . Marital status: Widowed    Spouse name: Not on file  . Number of children: 2  . Years of education: Not on file  . Highest education level: Not on file  Occupational History  . Occupation: retired  Tobacco Use  .  Smoking status: Current Every Day Smoker    Packs/day: 1.00    Years: 65.00    Pack years: 65.00    Types: Cigarettes  . Smokeless tobacco: Never Used  . Tobacco comment: currently smoking 1ppd 07/30/2016  Substance and Sexual Activity  . Alcohol use: Yes    Comment: occasionally  . Drug use: No  . Sexual activity: Not on file  Other Topics Concern  . Not on file  Social History Narrative   Current Smoker, smoking 21-30 Cigarettes Daily   Alcohol, Yes - rare         Social Determinants of Health   Financial Resource Strain: Not on file  Food Insecurity: Not on file  Transportation Needs: Not on file  Physical Activity: Not on file  Stress: Not on file  Social Connections: Not on file  Intimate Partner Violence: Not on file     Review of Systems: General: negative for chills, fever, night sweats or weight changes.  Cardiovascular: negative for chest pain, dyspnea on exertion, edema, orthopnea, palpitations, paroxysmal nocturnal dyspnea or shortness of breath Dermatological: negative for rash Respiratory: negative for cough or wheezing Urologic: negative for hematuria Abdominal: negative for nausea, vomiting, diarrhea, bright red blood per rectum, melena, or hematemesis Neurologic: negative for visual changes, syncope, or dizziness All other systems reviewed and are otherwise negative except as noted above.    Blood pressure (!) 150/74, pulse 98, height 5\' 1"  (  1.549 m), weight 110 lb (49.9 kg).  General appearance: alert and no distress Neck: no adenopathy, no carotid bruit, no JVD, supple, symmetrical, trachea midline and thyroid not enlarged, symmetric, no tenderness/mass/nodules Lungs: clear to auscultation bilaterally Heart: regular rate and rhythm, S1, S2 normal, no murmur, click, rub or gallop Extremities: extremities normal, atraumatic, no cyanosis or edema Pulses: Absent left radial and brachial pulse Skin: Skin color, texture, turgor normal. No rashes or  lesions Neurologic: Alert and oriented X 3, normal strength and tone. Normal symmetric reflexes. Normal coordination and gait  EKG sinus rhythm at 98 with septal Q waves.  I personally reviewed this EKG.  She has profile  ASSESSMENT AND PLAN:   Essential hypertension History of essential hypertension a blood pressure in the right arm of 150/74.  She is on low-dose amlodipine.  Smoking history History of tobacco use having smoked over 70 pack years and currently smoking 1/2 pack/day.  Peripheral arterial disease ([place]) [name] was referred to me by Vicenta Aly, FNP for evaluation of symptomatic left upper extremity claudication.  This began approximately a month or 2 ago.  Her left hand turns intermittently blue.  She has pain in decree strength in her left arm.  She did have Doppler studies performed at [facility] 03/09/2020 that suggested left axillary stenosis.  [name] get a CTA to further evaluate.  Normally, stenosis in this area is consistent with a vasculitis.  There is no blood pressure audible in the left upper extremity.  I cannot feel a left radial or brachial pulse.      Lorretta Harp MD FACP,FACC,FAHA, [facility] 03/16/2020 12:07 PM

## 2020-03-16 NOTE — Assessment & Plan Note (Signed)
History of essential hypertension a blood pressure in the right arm of 150/74.  She is on low-dose amlodipine.

## 2020-03-16 NOTE — Patient Instructions (Addendum)
Medication Instructions:  Your physician recommends that you continue on your current medications as directed. Please refer to the Current Medication list given to you today.  *If you need a refill on your cardiac medications before your next appointment, please call your pharmacy*   Lab Work: Lipid, Hepatic, BMET   If you have labs (blood work) drawn today and your tests are completely normal, you will receive your results only by: Marland Kitchen MyChart Message (if you have MyChart) OR . A paper copy in the mail If you have any lab test that is abnormal or we need to change your treatment, we will call you to review the results.   Testing/Procedures: CTA of thoracic aorta and upper extremities  Follow-Up: At [facility], you and your health needs are our priority.  As part of our continuing mission to provide you with exceptional heart care, we have created designated Provider Care Teams.  These Care Teams include your primary Cardiologist (physician) and Advanced Practice Providers (APPs -  Physician Assistants and Nurse Practitioners) who all work together to provide you with the care you need, when you need it.  We recommend signing up for the patient portal called "MyChart".  Sign up information is provided on this After Visit Summary.  MyChart is used to connect with patients for Virtual Visits (Telemedicine).  Patients are able to view lab/test results, encounter notes, upcoming appointments, etc.  Non-urgent messages can be sent to your provider as well.   To learn more about what you can do with MyChart, go to [email or web address].    Your next appointment:   3 month(s)  The format for your next appointment:   In Person  Provider:   Quay Burow, MD

## 2020-03-17 ENCOUNTER — Telehealth: Payer: Self-pay | Admitting: Cardiology

## 2020-03-17 ENCOUNTER — Telehealth: Payer: Self-pay | Admitting: *Deleted

## 2020-03-17 ENCOUNTER — Other Ambulatory Visit: Payer: Self-pay | Admitting: Cardiology

## 2020-03-17 ENCOUNTER — Other Ambulatory Visit: Payer: Self-pay

## 2020-03-17 DIAGNOSIS — E875 Hyperkalemia: Secondary | ICD-10-CM

## 2020-03-17 LAB — LIPID PANEL
Chol/HDL Ratio: 2.3 ratio (ref 0.0–4.4)
Cholesterol, Total: 157 mg/dL (ref 100–199)
HDL: 69 mg/dL (ref >39)
LDL Chol Calc (NIH): 71 mg/dL (ref 0–99)
Triglycerides: 96 mg/dL (ref 0–149)
VLDL Cholesterol Cal: 17 mg/dL (ref 5–40)

## 2020-03-17 LAB — HEPATIC FUNCTION PANEL
ALT: 15 IU/L (ref 0–32)
AST: 18 IU/L (ref 0–40)
Albumin: 4.2 g/dL (ref 3.6–4.6)
Alkaline Phosphatase: 81 IU/L (ref 44–121)
Bilirubin Total: <0.2 mg/dL (ref 0.0–1.2)
Bilirubin, Direct: 0.1 mg/dL (ref 0.00–0.40)
Total Protein: 7.1 g/dL (ref 6.0–8.5)

## 2020-03-17 LAB — BASIC METABOLIC PANEL
BUN/Creatinine Ratio: 17 (ref 12–28)
BUN: 22 mg/dL (ref 8–27)
CO2: 24 mmol/L (ref 20–29)
Calcium: 10.3 mg/dL (ref 8.7–10.3)
Chloride: 102 mmol/L (ref 96–106)
Creatinine, Ser: 1.29 mg/dL — ABNORMAL HIGH (ref 0.57–1.00)
GFR calc Af Amer: 44 mL/min/{1.73_m2} — ABNORMAL LOW (ref >59)
GFR calc non Af Amer: 38 mL/min/{1.73_m2} — ABNORMAL LOW (ref >59)
Glucose: 141 mg/dL — ABNORMAL HIGH (ref 65–99)
Potassium: 6 mmol/L (ref 3.5–5.2)
Sodium: 143 mmol/L (ref 134–144)

## 2020-03-17 NOTE — Telephone Encounter (Signed)
Spoke to patient, aware of lab results and need for STAT repeat today.  She will come to our office for this to be redrawn.

## 2020-03-17 NOTE — Telephone Encounter (Signed)
Patient was returning phone call. Per Mickel Baas, direct call Dr. Kennon Holter nurse. Please call back

## 2020-03-17 NOTE — Telephone Encounter (Signed)
[facility] called with K+ of 6.0  Pt not on meds for K+.  I do not yet have Cr.  Will send to Dr. Gwenlyn Found and his nurse.  Will recheck stat.   I will call pt to notify.  Order for lab placed

## 2020-03-17 NOTE — Telephone Encounter (Signed)
Notes received sent to referral for scheduling [place]

## 2020-03-18 ENCOUNTER — Other Ambulatory Visit: Payer: Self-pay

## 2020-03-18 ENCOUNTER — Telehealth: Payer: Self-pay

## 2020-03-18 DIAGNOSIS — Z79899 Other long term (current) drug therapy: Secondary | ICD-10-CM

## 2020-03-18 DIAGNOSIS — E875 Hyperkalemia: Secondary | ICD-10-CM

## 2020-03-18 LAB — BASIC METABOLIC PANEL
BUN/Creatinine Ratio: 20 (ref 12–28)
BUN/Creatinine Ratio: 24 (calc) — ABNORMAL HIGH (ref 6–22)
BUN: 31 mg/dL — ABNORMAL HIGH (ref 8–27)
BUN: 32 mg/dL — ABNORMAL HIGH (ref 7–25)
CO2: 27 mmol/L (ref 20–29)
CO2: 30 mmol/L (ref 20–32)
Calcium: 9.9 mg/dL (ref 8.7–10.3)
Calcium: 9.7 mg/dL (ref 8.6–10.4)
Chloride: 101 mmol/L (ref 96–106)
Chloride: 102 mmol/L (ref 98–110)
Creat: 1.35 mg/dL — ABNORMAL HIGH (ref 0.60–0.88)
Creatinine, Ser: 1.54 mg/dL — ABNORMAL HIGH (ref 0.57–1.00)
GFR calc Af Amer: 36 mL/min/{1.73_m2} — ABNORMAL LOW (ref >59)
GFR calc non Af Amer: 31 mL/min/{1.73_m2} — ABNORMAL LOW (ref >59)
Glucose, Bld: 220 mg/dL — ABNORMAL HIGH (ref 65–99)
Glucose: 210 mg/dL — ABNORMAL HIGH (ref 65–99)
Potassium: 5.5 mmol/L — ABNORMAL HIGH (ref 3.5–5.2)
Potassium: 5 mmol/L (ref 3.5–5.3)
Sodium: 141 mmol/L (ref 134–144)
Sodium: 138 mmol/L (ref 135–146)

## 2020-03-18 NOTE — Telephone Encounter (Signed)
Patient returned call to office to discuss recent BMET results.   Spoke with patient- patient made aware of lab results and the need to have them rechecked. Patient states that her daughter who drives her places is at home in [place] and may not be able to bring her out to have labs checked until this afternoon. Advised patient that [facility] [employer] closes at 5pm. Patient states she will come and pick up lab slips at the office and will then go to [facility] in the [place] [facility] to have labs drawn.   Dr. Gwenlyn Found would also like for patient to follow up with her PCP to address the elevated creatinine and the elevated potassium. Patient states she will call them to make an appointment. Advised patient if there is any issue in getting labs to please call the office. Office number given to patient.   Patient verbalized understanding of all instructions.

## 2020-03-20 NOTE — Telephone Encounter (Signed)
Scr and K improved. Repeat BMET 2 weeks

## 2020-03-25 ENCOUNTER — Ambulatory Visit (HOSPITAL_COMMUNITY)
Admission: RE | Admit: 2020-03-25 | Discharge: 2020-03-25 | Disposition: A | Discharge status: Home / Self Care | Payer: Medicare Other | Source: Ambulatory Visit | Attending: Cardiovascular Disease | Admitting: Cardiovascular Disease

## 2020-03-25 ENCOUNTER — Other Ambulatory Visit: Payer: Self-pay

## 2020-03-25 ENCOUNTER — Encounter (HOSPITAL_COMMUNITY): Payer: Self-pay

## 2020-03-25 DIAGNOSIS — I739 Peripheral vascular disease, unspecified: Secondary | ICD-10-CM

## 2020-03-25 HISTORY — DX: Essential (primary) hypertension: I10

## 2020-03-25 HISTORY — DX: Type 2 diabetes mellitus without complications: E11.9

## 2020-03-25 HISTORY — DX: Heart failure, unspecified: I50.9

## 2020-03-25 MED ORDER — IOHEXOL 350 MG/ML SOLN
80.0000 mL | Freq: Once | INTRAVENOUS | Status: AC | PRN
  Administered 2020-03-25: 80 mL via INTRAVENOUS

## 2020-03-25 NOTE — Progress Notes (Signed)
Late entry: [facility] CT calling for additional orders for upper extremity CT angio. Orders placed and prior auth expedited. Called [facility] to confirm receipt of orders and prior auth done.

## 2020-03-28 NOTE — Telephone Encounter (Signed)
Spoke with patient, advised patient to return next week for repeat BMET. Patient made aware of results and verbalized understanding.  Order for BMET has been placed.   Patient inquiring about CTA of Left upper extremity results. Patient made aware that Dr. Gwenlyn Found needs patient to return to office for visit to discuss results at next available per result note.   Lorretta Harp, MD  03/26/2020 9:46 AM EST      Return office visit with me to discuss results at next available    Made patient an appointment tomorrow 03/29/20 at 8:30am with Dr. Gwenlyn Found to discuss. Patient aware and verbalized understanding.

## 2020-03-28 NOTE — Addendum Note (Signed)
Addended by: Rexanne Mano B on: 03/28/2020 09:15 AM   Modules accepted: Orders

## 2020-03-29 ENCOUNTER — Other Ambulatory Visit: Payer: Self-pay

## 2020-03-29 ENCOUNTER — Ambulatory Visit (INDEPENDENT_AMBULATORY_CARE_PROVIDER_SITE_OTHER): Payer: Medicare Other | Admitting: Cardiovascular Disease

## 2020-03-29 ENCOUNTER — Encounter: Payer: Self-pay | Admitting: Cardiovascular Disease

## 2020-03-29 VITALS — BP 150/70 | HR 94 | Ht 61.0 in | Wt 110.4 lb

## 2020-03-29 DIAGNOSIS — I739 Peripheral vascular disease, unspecified: Secondary | ICD-10-CM

## 2020-03-29 NOTE — Progress Notes (Signed)
Linda Ray returns today for follow-up of her recent CTA performed because of left upper extremity critical limb ischemia with a cold week purplish hand.  This showed proximal left brachial artery high-grade stenosis suggesting an inflammatory etiology such as giant cell arteritis.  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  I am going to refer her to Dr. Carlis Abbott, vascular surgeon, for further evaluation.  I'll see her back in 6 months.  Lorretta Harp, M.D., [place], [facility], [place] [address] [place], [place]  [address]  [phone] 03/29/2020 8:51 AM

## 2020-03-29 NOTE — Patient Instructions (Signed)

## 2020-04-05 ENCOUNTER — Other Ambulatory Visit: Payer: Self-pay

## 2020-04-05 ENCOUNTER — Encounter: Payer: Medicare Other | Admitting: Vascular Surgery

## 2020-04-05 ENCOUNTER — Ambulatory Visit (INDEPENDENT_AMBULATORY_CARE_PROVIDER_SITE_OTHER): Payer: Medicare Other | Admitting: Vascular Surgery

## 2020-04-05 ENCOUNTER — Encounter: Payer: Self-pay | Admitting: Vascular Surgery

## 2020-04-05 DIAGNOSIS — I714 Abdominal aortic aneurysm, without rupture, unspecified: Secondary | ICD-10-CM

## 2020-04-05 DIAGNOSIS — I70208 Unspecified atherosclerosis of native arteries of extremities, other extremity: Secondary | ICD-10-CM | POA: Diagnosis not present

## 2020-04-05 NOTE — Progress Notes (Signed)
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 female, with history of diabetes, hypertension, COPD that presents as a referral from Dr. Gwenlyn Ray for evaluation of a left brachial artery stenosis with discolored left hand.  Patient reports that her hand has been turning blue since about Christmas and this affects all 5 digits in the left hand.  She has also noticed fatigue in her left arm where her muscles cramp whenever she is using the left arm.  She has no tissue loss in the left hand at this time.  She got a CTA left upper extremity on 03/25/2020 that suggest a high-grade proximal left brachial artery stenosis.  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  She states she had no prior knowledge of the aneurysm in her abdomen.  She has previously had a laparotomy years ago.  He does smoke about half pack a day and has smoked her entire life.  She denies any fevers, chills etc to suggest vasculitis.  No abdominal pain.  Does report chronic back pain for years at baseline.  Past Medical History:  Diagnosis Date  . Anemia   . Arthritis   . Borderline diabetes   . Breast cancer ([place])   . CHF (congestive heart failure) ([place])   . Chronic back pain   . COPD (chronic obstructive pulmonary disease) ([place])   . Diabetes mellitus without complication ([place])   . Hypertension   . RLS (restless legs syndrome)     Past Surgical History:  Procedure Laterality Date  . ABDOMINAL SURGERY  1954  . BACK SURGERY  2009  . BREAST SURGERY    . COLONOSCOPY     [facility]  . MASTECTOMY Left 2007  . MASTECTOMY    . TONSILLECTOMY      Family History  Problem Relation Age of Onset  . Colon cancer Mother        age 70  . Stroke Father   . Cancer Sister        breast, bladder, kidney     SOCIAL HISTORY: Social History   Socioeconomic History  . Marital status: Widowed    Spouse name: Not on file  . Number of children: 2  . Years of education: Not on file  . Highest education level: Not on file  Occupational History  . Occupation: retired  Tobacco Use  . Smoking status: Current Every Day Smoker    Packs/day: 1.00    Years: 65.00    Pack years: 65.00    Types: Cigarettes  . Smokeless tobacco: Never Used  . Tobacco comment: currently smoking 1ppd 07/30/2016  Substance and Sexual Activity  . Alcohol use: Yes    Comment: occasionally  . Drug use: No  . Sexual activity: Not on file  Other Topics Concern  . Not on file  Social History Narrative   Current Smoker, smoking 21-30 Cigarettes Daily   Alcohol, Yes - rare         Social Determinants of Health   Financial Resource Strain: Not on file  Food Insecurity: Not on file  Transportation Needs: Not on file  Physical Activity: Not on file  Stress: Not on file  Social Connections: Not on file  Intimate Partner Violence: Not on file    Allergies  Allergen Reactions  . Codeine Nausea And Vomiting  . Sulfa Drugs Cross Reactors Other (See Comments)    unknown  . Tetanus-Diphth-Acell Pertussis Other (See Comments)    Significant local reaction  . Metformin And Related Diarrhea    Current Outpatient Medications  Medication Sig Dispense Refill  . albuterol (PROVENTIL HFA;VENTOLIN HFA) 108 (90 Base) MCG/ACT inhaler Inhale 1-2 puffs into the lungs every 6 (six) hours as needed for wheezing or shortness of breath. 1 each 0  . amLODipine (NORVASC) 2.5 MG tablet Take 5 mg by mouth daily.    Marland Kitchen aspirin 81 MG tablet Take 81 mg by mouth daily.    . B Complex-C (B-COMPLEX WITH VITAMIN C) tablet Take 1 tablet by mouth daily.     No current facility-administered medications for this visit.    REVIEW OF SYSTEMS:  [X]  denotes positive finding, [ ]  denotes negative finding Cardiac  Comments:  Chest pain or chest  pressure:    Shortness of breath upon exertion: x   Short of breath when lying flat:    Irregular heart rhythm:        Vascular    Pain in calf, thigh, or hip brought on by ambulation:    Pain in feet at night that wakes you up from your sleep:     Blood clot in your veins:    Leg swelling:         Pulmonary    Oxygen at home:    Productive cough:     Wheezing:         Neurologic    Sudden weakness in arms or legs:     Sudden numbness in arms or legs:     Sudden onset of difficulty speaking or slurred speech:    Temporary loss of vision in one eye:     Problems with dizziness:         Gastrointestinal    Blood in stool:     Vomited blood:         Genitourinary    Burning when urinating:     Blood in urine:        Psychiatric    Major depression:         Hematologic    Bleeding problems:    Problems with blood clotting too easily:        Skin    Rashes or ulcers:        Constitutional    Fever or chills:      PHYSICAL EXAM: Vitals:   04/05/20 0828  BP: (!) 165/72  Pulse: (!) 107  Resp: 14  Temp: 98.2 F (36.8 C)  TempSrc: Temporal  SpO2: 98%  Weight: 109 lb (49.4 kg)  Height: 5\' 1"  (1.549 m)    GENERAL: The patient is a well-nourished female, in no acute distress. The vital signs are documented above. CARDIAC: There is a regular rate and rhythm.  VASCULAR:  Palpable brachial pulse high up toward the axilla in the left arm but no brachial pulse palpable at the antecubitum and no radial pulse palpable at left wrist, but left hand is warm and no tissue loss Palpable femoral pulses bilaterally Hard to appreciate pedal pulses on exam PULMONARY: No respiratory distress. ABDOMEN: Soft and non-tender MUSCULOSKELETAL: There are no major deformities or cyanosis. NEUROLOGIC: No focal weakness or paresthesias are detected. SKIN: There are no ulcers or rashes noted. PSYCHIATRIC: The patient has a normal affect.  DATA:   CTA left upper extremity on  03/25/2020  was reviewed and she does have a. high-grade proximal brachial artery stenosis.  She also has a supraceliac saccular aneurysm that has grown significant growth from a CT that was done about 1 year 9 months ago and it was imaged on this chest CT.  This aneurysm is just above the takeoff of the celiac artery that is stenotic with post stenotic dilation and she has a diseased proximal SMA.  Assessment/Plan:  86 year old female presents as a referral from Dr. Gwenlyn Ray with left brachial artery stenosis in the setting of discolored left hand with left arm claudication.  I don't think she has vasculitis based on her symptoms and she has a long history of tobacco abuse that puts her at risk for atherosclerotic disease.  I discussed with her that we could certainly arrange a left upper extremity arteriogram to further evaluate options whether she has an endovascular option and may ultimately require bypass if we do not feel endovascular therapy would be durable.  We will arrange for arteriogram next Thursday and risks and benefits were discussed.  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  She has no prior knowledge of this aneurysm although it was identified on a CT that was done 1 year 9 months ago.  This has  grown significantly over that timeframe.  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  I discussed with her and her daughter that I do not think we have a good option here in [place] to treat this given the location just above the celiac artery and I think she would ultimately require a likely four-vessel fenestration given the proximity to the visceral segment of her aorta.  I have reviewed her images with several my partners and we all feel that the best option would be a referral to Dr. Sammuel Hines in [place] to see if he thinks he has anything to offer given the location of her aneurysm adjacent to the visceral  segment.  We will get the referral to [facility] and also get her images to Dr. Sammuel Hines and certainly appreciate his assistance   Marty Heck, MD [facility]: [phone]

## 2020-04-13 ENCOUNTER — Other Ambulatory Visit (HOSPITAL_COMMUNITY)
Admission: RE | Admit: 2020-04-13 | Discharge: 2020-04-13 | Disposition: A | Discharge status: Home / Self Care | Payer: Medicare Other | Source: Ambulatory Visit | Attending: Vascular Surgery | Admitting: Vascular Surgery

## 2020-04-13 DIAGNOSIS — Z01812 Encounter for preprocedural laboratory examination: Secondary | ICD-10-CM | POA: Diagnosis present

## 2020-04-13 DIAGNOSIS — Z20822 Contact with and (suspected) exposure to covid-19: Secondary | ICD-10-CM | POA: Diagnosis not present

## 2020-04-13 LAB — SARS CORONAVIRUS 2 (TAT 6-24 HRS): SARS Coronavirus 2: NEGATIVE

## 2020-04-14 ENCOUNTER — Other Ambulatory Visit: Payer: Self-pay

## 2020-04-14 ENCOUNTER — Ambulatory Visit (HOSPITAL_COMMUNITY)
Admission: RE | Admit: 2020-04-14 | Discharge: 2020-04-14 | Disposition: A | Discharge status: Home / Self Care | Payer: Medicare Other | Attending: Vascular Surgery | Admitting: Vascular Surgery

## 2020-04-14 ENCOUNTER — Encounter (HOSPITAL_COMMUNITY)
Admission: RE | Disposition: A | Discharge status: Home / Self Care | Payer: Self-pay | Source: Home / Self Care | Attending: Vascular Surgery

## 2020-04-14 DIAGNOSIS — Z885 Allergy status to narcotic agent status: Secondary | ICD-10-CM | POA: Diagnosis not present

## 2020-04-14 DIAGNOSIS — Z7982 Long term (current) use of aspirin: Secondary | ICD-10-CM | POA: Diagnosis not present

## 2020-04-14 DIAGNOSIS — E1151 Type 2 diabetes mellitus with diabetic peripheral angiopathy without gangrene: Secondary | ICD-10-CM | POA: Insufficient documentation

## 2020-04-14 DIAGNOSIS — I70218 Atherosclerosis of native arteries of extremities with intermittent claudication, other extremity: Secondary | ICD-10-CM | POA: Insufficient documentation

## 2020-04-14 DIAGNOSIS — Z882 Allergy status to sulfonamides status: Secondary | ICD-10-CM | POA: Insufficient documentation

## 2020-04-14 DIAGNOSIS — Z79899 Other long term (current) drug therapy: Secondary | ICD-10-CM | POA: Insufficient documentation

## 2020-04-14 DIAGNOSIS — F17210 Nicotine dependence, cigarettes, uncomplicated: Secondary | ICD-10-CM | POA: Insufficient documentation

## 2020-04-14 DIAGNOSIS — I771 Stricture of artery: Secondary | ICD-10-CM | POA: Diagnosis not present

## 2020-04-14 HISTORY — PX: AORTIC ARCH ANGIOGRAPHY: CATH118224

## 2020-04-14 LAB — POCT ACTIVATED CLOTTING TIME
Activated Clotting Time: 249 seconds
Activated Clotting Time: 196 seconds
Activated Clotting Time: 184 seconds

## 2020-04-14 LAB — POCT I-STAT, CHEM 8
BUN: 31 mg/dL — ABNORMAL HIGH (ref 8–23)
Calcium, Ion: 1.22 mmol/L (ref 1.15–1.40)
Chloride: 104 mmol/L (ref 98–111)
Creatinine, Ser: 1.1 mg/dL — ABNORMAL HIGH (ref 0.44–1.00)
Glucose, Bld: 132 mg/dL — ABNORMAL HIGH (ref 70–99)
HCT: 39 % (ref 36.0–46.0)
Hemoglobin: 13.3 g/dL (ref 12.0–15.0)
Potassium: 3.7 mmol/L (ref 3.5–5.1)
Sodium: 141 mmol/L (ref 135–145)
TCO2: 25 mmol/L (ref 22–32)

## 2020-04-14 SURGERY — AORTIC ARCH ANGIOGRAPHY
Anesthesia: LOCAL

## 2020-04-14 MED ORDER — ATORVASTATIN CALCIUM 10 MG PO TABS: 10.0000 mg | ORAL_TABLET | Freq: Every day | ORAL | 11 refills | Status: AC

## 2020-04-14 MED ORDER — ONDANSETRON HCL 4 MG/2ML IJ SOLN: 4.0000 mg | Freq: Four times a day (QID) | INTRAMUSCULAR | Status: DC | PRN

## 2020-04-14 MED ORDER — LIDOCAINE HCL (PF) 1 % IJ SOLN
INTRAMUSCULAR | Status: AC
  Filled 2020-04-14: qty 30

## 2020-04-14 MED ORDER — CLOPIDOGREL BISULFATE 75 MG PO TABS: 75.0000 mg | ORAL_TABLET | Freq: Every day | ORAL | Status: DC

## 2020-04-14 MED ORDER — CLOPIDOGREL BISULFATE 300 MG PO TABS
ORAL_TABLET | ORAL | Status: AC
  Filled 2020-04-14: qty 1

## 2020-04-14 MED ORDER — SODIUM CHLORIDE 0.9% FLUSH: 3.0000 mL | INTRAVENOUS | Status: DC | PRN

## 2020-04-14 MED ORDER — CLOPIDOGREL BISULFATE 75 MG PO TABS: 75.0000 mg | ORAL_TABLET | Freq: Every day | ORAL | 11 refills | Status: AC

## 2020-04-14 MED ORDER — ATORVASTATIN CALCIUM 10 MG PO TABS: 10.0000 mg | ORAL_TABLET | Freq: Every day | ORAL | Status: DC

## 2020-04-14 MED ORDER — LIDOCAINE HCL (PF) 1 % IJ SOLN
INTRAMUSCULAR | Status: DC | PRN
  Administered 2020-04-14: 15 mL via INTRADERMAL

## 2020-04-14 MED ORDER — ACETAMINOPHEN 325 MG PO TABS: 650.0000 mg | ORAL_TABLET | ORAL | Status: DC | PRN

## 2020-04-14 MED ORDER — SODIUM CHLORIDE 0.9 % IV SOLN: 250.0000 mL | INTRAVENOUS | Status: DC | PRN

## 2020-04-14 MED ORDER — HEPARIN (PORCINE) IN NACL 1000-0.9 UT/500ML-% IV SOLN
INTRAVENOUS | Status: AC
  Filled 2020-04-14: qty 1000

## 2020-04-14 MED ORDER — MIDAZOLAM HCL 5 MG/5ML IJ SOLN
INTRAMUSCULAR | Status: AC
  Filled 2020-04-14: qty 5

## 2020-04-14 MED ORDER — FENTANYL CITRATE (PF) 100 MCG/2ML IJ SOLN
INTRAMUSCULAR | Status: AC
  Filled 2020-04-14: qty 2

## 2020-04-14 MED ORDER — HEPARIN SODIUM (PORCINE) 1000 UNIT/ML IJ SOLN
INTRAMUSCULAR | Status: DC | PRN
  Administered 2020-04-14 (×2): 3000 [IU] via INTRAVENOUS

## 2020-04-14 MED ORDER — FENTANYL CITRATE (PF) 100 MCG/2ML IJ SOLN
INTRAMUSCULAR | Status: DC | PRN
  Administered 2020-04-14: 25 ug via INTRAVENOUS

## 2020-04-14 MED ORDER — IODIXANOL 320 MG/ML IV SOLN
INTRAVENOUS | Status: DC | PRN
  Administered 2020-04-14: 100 mL via INTRA_ARTERIAL

## 2020-04-14 MED ORDER — CLOPIDOGREL BISULFATE 75 MG PO TABS: 300.0000 mg | ORAL_TABLET | Freq: Once | ORAL | Status: DC

## 2020-04-14 MED ORDER — LABETALOL HCL 5 MG/ML IV SOLN: 10.0000 mg | INTRAVENOUS | Status: DC | PRN

## 2020-04-14 MED ORDER — HEPARIN (PORCINE) IN NACL 1000-0.9 UT/500ML-% IV SOLN
INTRAVENOUS | Status: DC | PRN
  Administered 2020-04-14 (×2): 500 mL

## 2020-04-14 MED ORDER — MIDAZOLAM HCL 2 MG/2ML IJ SOLN
INTRAMUSCULAR | Status: DC | PRN
  Administered 2020-04-14: 1 mg via INTRAVENOUS

## 2020-04-14 MED ORDER — HYDRALAZINE HCL 20 MG/ML IJ SOLN: 5.0000 mg | INTRAMUSCULAR | Status: DC | PRN

## 2020-04-14 MED ORDER — CLOPIDOGREL BISULFATE 300 MG PO TABS
ORAL_TABLET | ORAL | Status: DC | PRN
  Administered 2020-04-14: 300 mg via ORAL

## 2020-04-14 MED ORDER — SODIUM CHLORIDE 0.9 % IV SOLN: INTRAVENOUS | Status: DC

## 2020-04-14 MED ORDER — SODIUM CHLORIDE 0.9% FLUSH: 3.0000 mL | Freq: Two times a day (BID) | INTRAVENOUS | Status: DC

## 2020-04-14 MED ORDER — SODIUM CHLORIDE 0.9 % IV SOLN: INTRAVENOUS | Status: AC

## 2020-04-14 SURGICAL SUPPLY — 19 items
BALLN MUSTANG 4X40X135 (BALLOONS)
BALLOON MUSTANG 4X40X135 (BALLOONS) IMPLANT
CATH ANGIO 5F PIGTAIL 100CM (CATHETERS) ×1 IMPLANT
CATH ANGIO 5F SIM1 100CM (CATHETERS) ×1 IMPLANT
CATH MUSTANG 3X40X135 (BALLOONS) ×1 IMPLANT
DEVICE CONTINUOUS FLUSH (MISCELLANEOUS) ×1 IMPLANT
GLIDEWIRE ADV .035X180CM (WIRE) ×1 IMPLANT
KIT ENCORE 26 ADVANTAGE (KITS) ×1 IMPLANT
KIT MICROPUNCTURE NIT STIFF (SHEATH) ×1 IMPLANT
KIT PV (KITS) ×2 IMPLANT
SHEATH PINNACLE 5F 10CM (SHEATH) ×1 IMPLANT
SHEATH PINNACLE 6F 10CM (SHEATH) ×1 IMPLANT
SHEATH SHUTTLE SELECT 6F (SHEATH) ×1 IMPLANT
STOPCOCK MORSE 400PSI 3WAY (MISCELLANEOUS) ×1 IMPLANT
SYR MEDRAD MARK 7 150ML (SYRINGE) ×2 IMPLANT
TRANSDUCER W/STOPCOCK (MISCELLANEOUS) ×2 IMPLANT
TRAY PV CATH (CUSTOM PROCEDURE TRAY) ×2 IMPLANT
WIRE ROSEN-J .035X260CM (WIRE) ×1 IMPLANT
WIRE STARTER BENTSON 035X150 (WIRE) ×1 IMPLANT

## 2020-04-14 NOTE — H&P (Signed)
History and Physical Interval Note:  04/14/2020 8:41 AM  Linda Ray  has presented today for surgery, with the diagnosis of pad.  The various methods of treatment have been discussed with the patient and family. After consideration of risks, benefits and other options for treatment, the patient has consented to  Procedure(s): AORTIC ARCH ANGIOGRAPHY (N/A) as a surgical intervention.  The patient's history has been reviewed, patient examined, no change in status, stable for surgery.  I have reviewed the patient's chart and labs.  Questions were answered to the patient's satisfaction.    Arch aortogram, left upper extremity arteriogram, evaluate brachial artery stenosis.  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 female, with history of diabetes, hypertension, COPD that presents as a referral from Dr. Gwenlyn Found for evaluation of a left brachial artery stenosis with discolored left hand.  Patient reports that her hand has been turning blue since about Christmas and this affects all 5 digits in the left hand.  She has also noticed fatigue in her left arm where her muscles cramp whenever she is using the left arm.  She has no tissue loss in the left hand at this time.  She got a CTA left upper extremity on 03/25/2020 that suggest a high-grade proximal left brachial artery stenosis.  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  She states she had no prior knowledge of the aneurysm in her abdomen.  She has previously had a laparotomy years ago.  He does smoke about half pack a day and has smoked her entire life.  She denies any fevers, chills etc to suggest vasculitis.  No abdominal pain.  Does report chronic back pain for years at baseline.       Past Medical History:  Diagnosis Date  . Anemia   . Arthritis   . Borderline diabetes   . Breast cancer ([place])   . CHF (congestive heart failure) ([place])   . Chronic back pain   . COPD (chronic obstructive pulmonary disease) ([employer])   . Diabetes mellitus without complication ([place])   . Hypertension   . RLS (restless legs syndrome)          Past Surgical History:  Procedure Laterality Date  . ABDOMINAL SURGERY  1954  . BACK SURGERY  2009  . BREAST SURGERY    . COLONOSCOPY     [facility]  . MASTECTOMY Left 2007  . MASTECTOMY    . TONSILLECTOMY           Family History  Problem Relation Age of Onset  . Colon cancer Mother        age 60  . Stroke Father   . Cancer Sister        breast, bladder, kidney    SOCIAL HISTORY: Social History        Socioeconomic History  . Marital status: Widowed    Spouse name: Not on file  . Number of children: 2  . Years of education: Not on file  . Highest education level: Not on file  Occupational History  . Occupation: retired  Tobacco Use  . Smoking status: Current Every Day  Smoker    Packs/day: 1.00    Years: 65.00    Pack years: 65.00    Types: Cigarettes  . Smokeless tobacco: Never Used  . Tobacco comment: currently smoking 1ppd 07/30/2016  Substance and Sexual Activity  . Alcohol use: Yes    Comment: occasionally  . Drug use: No  . Sexual activity: Not on file  Other Topics Concern  . Not on file  Social History Narrative   Current Smoker, smoking 21-30 Cigarettes Daily   Alcohol, Yes - rare         Social Determinants of Health   Financial Resource Strain: Not on file  Food Insecurity: Not on file  Transportation Needs: Not on file  Physical Activity: Not on file  Stress: Not on file  Social Connections: Not on file  Intimate Partner Violence: Not on file         Allergies  Allergen Reactions  . Codeine Nausea And Vomiting  . Sulfa Drugs Cross Reactors  Other (See Comments)    unknown  . Tetanus-Diphth-Acell Pertussis Other (See Comments)    Significant local reaction  . Metformin And Related Diarrhea          Current Outpatient Medications  Medication Sig Dispense Refill  . albuterol (PROVENTIL HFA;VENTOLIN HFA) 108 (90 Base) MCG/ACT inhaler Inhale 1-2 puffs into the lungs every 6 (six) hours as needed for wheezing or shortness of breath. 1 each 0  . amLODipine (NORVASC) 2.5 MG tablet Take 5 mg by mouth daily.    Marland Kitchen aspirin 81 MG tablet Take 81 mg by mouth daily.    . B Complex-C (B-COMPLEX WITH VITAMIN C) tablet Take 1 tablet by mouth daily.     No current facility-administered medications for this visit.    REVIEW OF SYSTEMS:  [X]  denotes positive finding, [ ]  denotes negative finding Cardiac  Comments:  Chest pain or chest pressure:    Shortness of breath upon exertion: x   Short of breath when lying flat:    Irregular heart rhythm:        Vascular    Pain in calf, thigh, or hip brought on by ambulation:    Pain in feet at night that wakes you up from your sleep:     Blood clot in your veins:    Leg swelling:         Pulmonary    Oxygen at home:    Productive cough:     Wheezing:         Neurologic    Sudden weakness in arms or legs:     Sudden numbness in arms or legs:     Sudden onset of difficulty speaking or slurred speech:    Temporary loss of vision in one eye:     Problems with dizziness:         Gastrointestinal    Blood in stool:     Vomited blood:         Genitourinary    Burning when urinating:     Blood in urine:        Psychiatric    Major depression:         Hematologic    Bleeding problems:    Problems with blood clotting too easily:        Skin    Rashes or ulcers:        Constitutional    Fever or chills:      PHYSICAL EXAM:    Vitals:  04/05/20 0828  BP: (!)  165/72  Pulse: (!) 107  Resp: 14  Temp: 98.2 F (36.8 C)  TempSrc: Temporal  SpO2: 98%  Weight: 109 lb (49.4 kg)  Height: 5\' 1"  (1.549 m)    GENERAL: The patient is a well-nourished female, in no acute distress. The vital signs are documented above. CARDIAC: There is a regular rate and rhythm.  VASCULAR:  Palpable brachial pulse high up toward the axilla in the left arm but no brachial pulse palpable at the antecubitum and no radial pulse palpable at left wrist, but left hand is warm and no tissue loss Palpable femoral pulses bilaterally Hard to appreciate pedal pulses on exam PULMONARY: No respiratory distress. ABDOMEN: Soft and non-tender MUSCULOSKELETAL: There are no major deformities or cyanosis. NEUROLOGIC: No focal weakness or paresthesias are detected. SKIN: There are no ulcers or rashes noted. PSYCHIATRIC: The patient has a normal affect.  DATA:   CTA left upper extremity on 03/25/2020 was reviewed and she does have a. high-grade proximal brachial artery stenosis.  She also has a supraceliac saccular aneurysm that has grown significant growth from a CT that was done about 1 year 9 months ago and it was imaged on this chest CT.  This aneurysm is just above the takeoff of the celiac artery that is stenotic with post stenotic dilation and she has a diseased proximal SMA.  Assessment/Plan:  86 year old female presents as a referral from Dr. Gwenlyn Found with left brachial artery stenosis in the setting of discolored left hand with left arm claudication.  I don't think she has vasculitis based on her symptoms and she has a long history of tobacco abuse that puts her at risk for atherosclerotic disease.  I discussed with her that we could certainly arrange a left upper extremity arteriogram to further evaluate options whether she has an endovascular option and may ultimately require bypass if we do not feel endovascular therapy would be durable.  We will arrange for arteriogram next  Thursday and risks and benefits were discussed.  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  She has no prior knowledge of this aneurysm although it was identified on a CT that was done 1 year 9 months ago.  This has  grown significantly over that timeframe.  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  I discussed with her and her daughter that I do not think we have a good option here in [place] to treat this given the location just above the celiac artery and I think she would ultimately require a likely four-vessel fenestration given the proximity to the visceral segment of her aorta.  I have reviewed her images with several my partners and we all feel that the best option would be a referral to Dr. Sammuel Hines in [place] to see if he thinks he has anything to offer given the location of her aneurysm adjacent to the visceral segment.  We will get the referral to [facility] and also get her images to Dr. Sammuel Hines and certainly appreciate his assistance   Marty Heck, MD [facility]: [phone]

## 2020-04-14 NOTE — Progress Notes (Signed)
Discharge instructions reviewed with pt and her daughter (via telephone) both voice understanding.  

## 2020-04-14 NOTE — Progress Notes (Signed)
37fr sheath aspirated and removed from rfa, manual pressure applied for 25 minutes. Site level 0 no s+s of hematoma. Tegaderm dressing applied, bedrest instructions given.   Bilateral dp and pt pulses present with doppler. Left radial pulse palpable.    Bedrest begins at 13:15:00

## 2020-04-14 NOTE — Op Note (Signed)
Patient name: Linda Ray MRN: [id number] DOB: 05/27/1934 Sex: female  04/14/2020 Pre-operative Diagnosis: Left brachial artery stenosis with left arm claudication and discolored fingers in the left hand Post-operative diagnosis:  Same Surgeon:  Marty Heck, MD Procedure Performed: 1.  Ultrasound guided access of right common femoral artery 2.  Limited arch aortogram and left upper extremity arteriogram with selection of second-order branches in the left upper extremity 3.  Left brachial artery angioplasty (3 mm x 40 mm Mustang) 4.  58 minutes of monitored moderate conscious sedation time  Indications: Patient is 86 year old female that was referred from cardiology with discolored left fingers with left arm claudication and CT showing left brachial artery stenosis.  She presents today for left upper extremity arteriogram and possible intervention after risk benefits discussed.  Findings:   Limited arch aortogram showed type III arch.  The left subclavian artery has some calcification at the ostium but this appeared to be less than 30 to 40% and not flow-limiting.  The left subclavian and axillary artery are widely patent and the left vert was patent.  Left brachial artery had a short 20 to 30 mm segment with high-grade stenosis in the mid segment of the artery.  The remainder of the brachial artery was patent and dominant flow into the hand was through the radial artery.  After the lesion was crossed from right femoral approach it was angioplastied with a 3 mm balloon to nominal pressure for 2 minutes.  I did not use a larger balloon given it was a fairly small brachial artery.  We then had no residual dissection with a patent brachial artery and then we had preserved runoff in the radial and a diminutive ulnar noted at the end of the case   Procedure:  The patient was identified in the holding area and taken to room 8.  The patient was then placed supine on the table and prepped and  draped in the usual sterile fashion.  A time out was called.  Ultrasound was used to evaluate the right common femoral artery.  It was patent .  A digital ultrasound image was acquired.  A micropuncture needle was used to access the right common femoral artery under ultrasound guidance.  An 018 wire was advanced without resistance and a micropuncture sheath was placed.  The 018 wire was removed and a benson wire was placed.  The micropuncture sheath was exchanged for a 5 french sheath.  The patient was given 3000 units of IV heparin.  I then advanced a pigtail catheter into the ascending aorta and a limited arch aortogram was obtained.  This was a type III arch.  I then used a Simmons 1 catheter with a Bentson wire to cannulate the left subclavian artery and was able to get my Simmons catheter into the proximal left subclavian.  We then did hand injections and staged imaging of the left upper extremity that showed a focal high-grade brachial stenosis with dominant runoff of the radial artery to the left hand.  We elected to try and treat this with angioplasty.  I then used a Glidewire advantage ultimately to select the left axillary and avoid the vert and I was able to get my Rosita Fire 1 catheter to advance on out into the proximal brachial artery where we identified the lesion and successfully crossed the lesion with a Glidewire advantage and I got a Simmons catheter to cross the lesion.  We confirmed on hand-injection we were in the true  lumen.  I put a Rosen wire out in the brachial artery distal to the lesion and then we exchanged for a long 6 [place] shuttle sheath in the right common femoral artery up into the arch and this was placed all the way out into the left axillary artery.  On hand-injection we confirmed the location of the lesion this was angioplastied with a 3 mm x 40 mm Mustang to nominal pressure for 2 minutes.  We had preserved runoff down the left lower extremity with no residual stenosis in the  brachial artery and this appeared to be compatible with the size of the adjacent normal-appearing artery.  We had preserved runoff in the radial and could also see a diminutive ulnar at the end of the case.  She now has a palpable left radial pulse.  Wires and catheters were removed and we put a short 6 French sheath in the right common femoral artery.  She remained stable and was taken to holding to have the sheath removed.  Plan: Patient will be loaded on Plavix and need aspirin Plavix.  Will arrange follow-up in 1 month.  She has been referred to [facility] for her saccular aneurysm above her celiac artery.  Marty Heck, MD [facility] [place] Office: [phone]

## 2020-04-14 NOTE — Discharge Instructions (Signed)
Femoral Site Care  This sheet gives you information about how to care for yourself after your procedure. Your health care provider may also give you more specific instructions. If you have problems or questions, contact your health care provider. What can I expect after the procedure? After the procedure, it is common to have:  Bruising that usually fades within 1-2 weeks.  Tenderness at the site. Follow these instructions at home: Wound care  Follow instructions from your health care provider about how to take care of your insertion site. Make sure you: ? Wash your hands with soap and water before you change your bandage (dressing). If soap and water are not available, use hand sanitizer. ? Change your dressing as told by your health care provider. ? Leave stitches (sutures), skin glue, or adhesive strips in place. These skin closures may need to stay in place for 2 weeks or longer. If adhesive strip edges start to loosen and curl up, you may trim the loose edges. Do not remove adhesive strips completely unless your health care provider tells you to do that.  Do not take baths, swim, or use a hot tub until your health care provider approves.  You may shower 24-48 hours after the procedure or as told by your health care provider. ? Gently wash the site with plain soap and water. ? Pat the area dry with a clean towel. ? Do not rub the site. This may cause bleeding.  Do not apply powder or lotion to the site. Keep the site clean and dry.  Check your femoral site every day for signs of infection. Check for: ? Redness, swelling, or pain. ? Fluid or blood. ? Warmth. ? Pus or a bad smell. Activity  For the first 2-3 days after your procedure, or as long as directed: ? Avoid climbing stairs as much as possible. ? Do not squat.  Do not lift anything that is heavier than 10 lb (4.5 kg), or the limit that you are told, until your health care provider says that it is safe.  Rest as  directed. ? Avoid sitting for a long time without moving. Get up to take short walks every 1-2 hours.  Do not drive for 24 hours if you were given a medicine to help you relax (sedative). General instructions  Take over-the-counter and prescription medicines only as told by your health care provider.  Keep all follow-up visits as told by your health care provider. This is important. Contact a health care provider if you have:  A fever or chills.  You have redness, swelling, or pain around your insertion site. Get help right away if:  The catheter insertion area swells very fast.  You pass out.  You suddenly start to sweat or your skin gets clammy.  The catheter insertion area is bleeding, and the bleeding does not stop when you hold steady pressure on the area.  The area near or just beyond the catheter insertion site becomes pale, cool, tingly, or numb. These symptoms may represent a serious problem that is an emergency. Do not wait to see if the symptoms will go away. Get medical help right away. Call your local emergency services (911 in the [place] Do not drive yourself to the hospital. Summary  After the procedure, it is common to have bruising that usually fades within 1-2 weeks.  Check your femoral site every day for signs of infection.  Do not lift anything that is heavier than 10 lb (4.5 kg), or   the limit that you are told, until your health care provider says that it is safe. This information is not intended to replace advice given to you by your health care provider. Make sure you discuss any questions you have with your health care provider. Document Revised: 10/16/2019 Document Reviewed: 10/16/2019 Elsevier Patient Education  2021 Elsevier Inc.  

## 2020-04-14 NOTE — Progress Notes (Signed)
Up an walked and tolerated well; right groin stable, no bleeding or hematoma

## 2020-04-15 ENCOUNTER — Encounter (HOSPITAL_COMMUNITY): Payer: Self-pay | Admitting: Vascular Surgery

## 2020-05-13 ENCOUNTER — Other Ambulatory Visit: Payer: Self-pay | Admitting: *Deleted

## 2020-05-13 DIAGNOSIS — I70208 Unspecified atherosclerosis of native arteries of extremities, other extremity: Secondary | ICD-10-CM

## 2020-05-17 ENCOUNTER — Ambulatory Visit (INDEPENDENT_AMBULATORY_CARE_PROVIDER_SITE_OTHER): Payer: Medicare Other | Admitting: Vascular Surgery

## 2020-05-17 ENCOUNTER — Other Ambulatory Visit: Payer: Self-pay

## 2020-05-17 ENCOUNTER — Ambulatory Visit (HOSPITAL_COMMUNITY)
Admission: RE | Admit: 2020-05-17 | Discharge: 2020-05-17 | Disposition: A | Discharge status: Home / Self Care | Payer: Medicare Other | Source: Ambulatory Visit | Attending: Vascular Surgery | Admitting: Vascular Surgery

## 2020-05-17 ENCOUNTER — Encounter: Payer: Self-pay | Admitting: Vascular Surgery

## 2020-05-17 VITALS — BP 115/70 | HR 99 | Temp 96.7°F | Resp 14 | Ht 61.0 in | Wt 107.0 lb

## 2020-05-17 DIAGNOSIS — I714 Abdominal aortic aneurysm, without rupture, unspecified: Secondary | ICD-10-CM

## 2020-05-17 DIAGNOSIS — I70208 Unspecified atherosclerosis of native arteries of extremities, other extremity: Secondary | ICD-10-CM

## 2020-05-17 NOTE — Progress Notes (Signed)
Patient name: Linda Ray MRN: [id number] DOB: December 24, 1934 Sex: female  REASON FOR CONSULT: Follow-up after angioplasty for left brachial artery stenosis (referred to [facility] for saccular aneurysm of the supraceliac aorta)  HPI: [name] is a 86 y.o. female, with history of diabetes, hypertension, COPD that presents for follow-up of left brachial artery angioplasty for brachial artery stenosis on 04/14/2020.  She states her left hand is doing much better.  No fatigue.  No discoloration.   Can feel her radial pulse now.  She previously reported that her hand had been turning blue since about Christmas and this affects all 5 digits in the left hand.  She had also noticed fatigue in her left arm where her muscles cramp whenever she was using the left arm.  She got a CTA left upper extremity on 03/25/2020 that suggest a high-grade proximal left brachial artery stenosis.  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  She states she had no prior knowledge of the aneurysm in her abdomen.    She was ultimately referred to [facility] for custom modified four-vessel fenestration to treat her supraceliac aorta.  I did get a note from Dr. Sammuel Hines and ultimately she has elected continued observation and has 38-month follow-up with [facility].  Her four-vessel endograft would require iliac conduit and she do not want to take on the risks of cardiac and pulmonary complications.  Past Medical History:  Diagnosis Date  . Anemia   . Arthritis   . Borderline diabetes   . Breast cancer ([place])   . CHF (congestive heart failure) ([place])   . Chronic back pain   . COPD (chronic obstructive pulmonary disease) ([place])   . Diabetes mellitus without complication ([place])   . Hypertension   . RLS (restless legs syndrome)     Past Surgical History:  Procedure Laterality Date  . ABDOMINAL SURGERY  1954  . AORTIC ARCH ANGIOGRAPHY N/A 04/14/2020   Procedure: AORTIC ARCH  ANGIOGRAPHY;  Surgeon: Marty Heck, MD;  Location: [facility];  Service: Cardiovascular;  Laterality: N/A;  . BACK SURGERY  2009  . BREAST SURGERY    . COLONOSCOPY     [facility]  . MASTECTOMY Left 2007  . MASTECTOMY    . TONSILLECTOMY      Family History  Problem Relation Age of Onset  . Colon cancer Mother        age 28  . Stroke Father   . Cancer Sister        breast, bladder, kidney    SOCIAL HISTORY: Social History   Socioeconomic History  . Marital status: Widowed    Spouse name: Not on file  . Number of children: 2  . Years of education: Not on file  . Highest education level: Not on file  Occupational History  . Occupation: retired  Tobacco Use  . Smoking status: Current Every Day Smoker    Packs/day: 1.00    Years: 65.00    Pack years: 65.00    Types: Cigarettes  . Smokeless tobacco: Never Used  . Tobacco comment: currently smoking 1ppd 07/30/2016  Substance and Sexual Activity  . Alcohol use: Yes    Comment: occasionally  . Drug use: No  . Sexual activity: Not on file  Other Topics Concern  . Not on file  Social History Narrative   Current Smoker, smoking 21-30 Cigarettes Daily   Alcohol, Yes - rare  Social Determinants of Health   Financial Resource Strain: Not on file  Food Insecurity: Not on file  Transportation Needs: Not on file  Physical Activity: Not on file  Stress: Not on file  Social Connections: Not on file  Intimate Partner Violence: Not on file    Allergies  Allergen Reactions  . Sulfa Drugs Cross Reactors Nausea And Vomiting  . Tetanus-Diphth-Acell Pertussis Swelling    Significant local reaction  . Codeine Nausea And Vomiting  . Metformin And Related Diarrhea  . Sulfa Antibiotics Itching    Current Outpatient Medications  Medication Sig Dispense Refill  . albuterol (PROVENTIL HFA;VENTOLIN HFA) 108 (90 Base) MCG/ACT inhaler Inhale 1-2 puffs into the lungs every 6 (six) hours as needed for wheezing or  shortness of breath. 1 each 0  . amLODipine (NORVASC) 2.5 MG tablet Take 5 mg by mouth every evening.    Marland Kitchen aspirin 81 MG tablet Take 81 mg by mouth daily.    Marland Kitchen atorvastatin (LIPITOR) 10 MG tablet Take 1 tablet (10 mg total) by mouth daily. 30 tablet 11  . B Complex-C (B-COMPLEX WITH VITAMIN C) tablet Take 1 tablet by mouth daily.    . clopidogrel (PLAVIX) 75 MG tablet Take 1 tablet (75 mg total) by mouth daily. 30 tablet 11  . traMADol (ULTRAM) 50 MG tablet Take 25 mg by mouth daily as needed for moderate pain. (Patient not taking: Reported on 05/17/2020)     No current facility-administered medications for this visit.    REVIEW OF SYSTEMS:  [X]  denotes positive finding, [ ]  denotes negative finding Cardiac  Comments:  Chest pain or chest pressure:    Shortness of breath upon exertion: x   Short of breath when lying flat:    Irregular heart rhythm:        Vascular    Pain in calf, thigh, or hip brought on by ambulation:    Pain in feet at night that wakes you up from your sleep:     Blood clot in your veins:    Leg swelling:         Pulmonary    Oxygen at home:    Productive cough:     Wheezing:         Neurologic    Sudden weakness in arms or legs:     Sudden numbness in arms or legs:     Sudden onset of difficulty speaking or slurred speech:    Temporary loss of vision in one eye:     Problems with dizziness:         Gastrointestinal    Blood in stool:     Vomited blood:         Genitourinary    Burning when urinating:     Blood in urine:        Psychiatric    Major depression:         Hematologic    Bleeding problems:    Problems with blood clotting too easily:        Skin    Rashes or ulcers:        Constitutional    Fever or chills:      PHYSICAL EXAM: Vitals:   05/17/20 1541  BP: 115/70  Pulse: 99  Resp: 14  Temp: (!) 96.7 F (35.9 C)  TempSrc: Temporal  SpO2: 95%  Weight: 107 lb (48.5 kg)  Height: 5\' 1"  (1.549 m)    GENERAL: The patient  is a well-nourished female,  in no acute distress. The vital signs are documented above. CARDIAC: There is a regular rate and rhythm.  VASCULAR:  Left radial pulse palpable Left hand normal color, good capillary refill   DATA:    Left upper extremity arterial duplex today shows widely patent left upper extremity with no recurrent stenosis after angioplasty.  Assessment/Plan:  86 year old female presents for follow-up after left brachial artery angioplasty for left brachial artery stenosis with fatigue and discoloration in the hand.  She now has an easily palpable radial pulse at the wrist.  Her duplex today shows a widely patent angioplasty site with no recurrent stenosis.  She wants to stop her Plavix given the bruising that she thinks it has caused increased fatigue as well.  She has agreed to stay on aspirin statin from my standpoint.  I will see her in 1 year with left arm arterial duplex for ongoing surveillance.  She has follow-up with Dr. Sammuel Hines in 6 months for CTA and surveillance of her supraceliac aneurysm and she has elected no custom modified four-vessel fenestration at this time which would require iliac conduit.   Marty Heck, MD [facility] Office: [phone]

## 2020-09-07 ENCOUNTER — Encounter (HOSPITAL_BASED_OUTPATIENT_CLINIC_OR_DEPARTMENT_OTHER): Payer: Self-pay | Admitting: Obstetrics and Gynecology

## 2020-09-07 ENCOUNTER — Emergency Department (HOSPITAL_BASED_OUTPATIENT_CLINIC_OR_DEPARTMENT_OTHER)
Admission: EM | Admit: 2020-09-07 | Discharge: 2020-09-07 | Disposition: A | Discharge status: Home / Self Care | Payer: Medicare Other | Attending: Emergency Medicine | Admitting: Emergency Medicine

## 2020-09-07 ENCOUNTER — Other Ambulatory Visit: Payer: Self-pay

## 2020-09-07 DIAGNOSIS — Z853 Personal history of malignant neoplasm of breast: Secondary | ICD-10-CM | POA: Insufficient documentation

## 2020-09-07 DIAGNOSIS — F17210 Nicotine dependence, cigarettes, uncomplicated: Secondary | ICD-10-CM | POA: Diagnosis not present

## 2020-09-07 DIAGNOSIS — I110 Hypertensive heart disease with heart failure: Secondary | ICD-10-CM | POA: Insufficient documentation

## 2020-09-07 DIAGNOSIS — Z7902 Long term (current) use of antithrombotics/antiplatelets: Secondary | ICD-10-CM | POA: Insufficient documentation

## 2020-09-07 DIAGNOSIS — E119 Type 2 diabetes mellitus without complications: Secondary | ICD-10-CM | POA: Diagnosis not present

## 2020-09-07 DIAGNOSIS — Z7982 Long term (current) use of aspirin: Secondary | ICD-10-CM | POA: Diagnosis not present

## 2020-09-07 DIAGNOSIS — I509 Heart failure, unspecified: Secondary | ICD-10-CM | POA: Diagnosis not present

## 2020-09-07 DIAGNOSIS — R040 Epistaxis: Secondary | ICD-10-CM

## 2020-09-07 DIAGNOSIS — J441 Chronic obstructive pulmonary disease with (acute) exacerbation: Secondary | ICD-10-CM | POA: Insufficient documentation

## 2020-09-07 DIAGNOSIS — Z79899 Other long term (current) drug therapy: Secondary | ICD-10-CM | POA: Diagnosis not present

## 2020-09-07 NOTE — ED Triage Notes (Signed)
Patient reports a nosebleed in the right nare. No noted active bleeding at this time. Patient reports "it is seeping"

## 2020-09-07 NOTE — Discharge Instructions (Addendum)
Place a small amount of Vaseline or bacitracin or Neosporin just the tip of your pinky and rub just inside the nose a couple times a day to help keep it moist and lubricated.  Please return for bleeding that does not stop with 2 different attempts to hold pressure for 15 minutes without letting go.  If you have a humidifier he can use in your bedroom overnight.  Please follow-up with your family doctor in the office.  I have given you information for the ear nose and throat physician that is on-call if you would like to see them in the office.

## 2020-09-07 NOTE — ED Provider Notes (Signed)
[facility] EMERGENC[facility] DEPT Provider Note   CSN: [id number] Arrival date & time: 09/07/20  2056     History Chief Complaint  Patient presents with   Epistaxis    Linda Ray is a 86 y.o. female.  86 yo F with a chief complaints of bleeding from the right naris.  This was off and on this evening.  She thinks it stopped by the time she got here.  She denies any trauma to the area.  Denies any pain or fever.  Has had nosebleeds before but usually did not last this long.  Tried ice and pressure off and on.  The history is provided by the patient.  Epistaxis Location:  R nare Severity:  Moderate Duration:  4 hours Timing:  Intermittent Progression:  Waxing and waning Chronicity:  Recurrent Relieved by:  Nothing Worsened by:  Nothing Ineffective treatments:  Applying pressure and ice Associated symptoms: no blood in oropharynx, no congestion, no dizziness, no fever and no headaches       Past Medical History:  Diagnosis Date   Anemia    Arthritis    Borderline diabetes    Breast cancer ([facility])    CHF (congestive heart failure) ([facility])    Chronic back pain    COPD (chronic obstructive pulmonary disease) ([facility])    Diabetes mellitus without complication ([facility])    Hypertension    RLS (restless legs syndrome)     Patient Active Problem List   Diagnosis Date Noted   Brachial artery stenosis, left ([place]) 04/05/2020   Abdominal aortic aneurysm ([place]) 04/05/2020   Essential hypertension 03/16/2020   Peripheral arterial disease ([place]) 03/16/2020   Malnutrition of moderate degree 10/20/2018   COPD with acute exacerbation ([place]) 10/18/2018   Iron deficiency anemia 10/18/2018   Thrombocytosis 10/18/2018   Acute CHF (congestive heart failure) ([place]) 10/18/2018   Pneumonia 06/12/2018   Hyponatremia 06/10/2018   Rash 06/10/2018   Myalgia 06/10/2018   Sinus tachycardia 06/10/2018   UTI (urinary tract infection) 06/10/2018   COPD, moderate ([place]) 07/27/2015   Smoking  history 05/31/2015   Incidental lung nodule, > 31mm and < 60mm 05/31/2015   Other fatigue 05/31/2015   Breast cancer, left breast - lobular T2N0 12/05/2010    Past Surgical History:  Procedure Laterality Date   ABDOMINAL SURGERY  1954   AORTIC ARCH ANGIOGRAPHY N/A 04/14/2020   Procedure: AORTIC ARCH ANGIOGRAPHY;  Surgeon: Marty Heck, MD;  Location: [facility];  Service: Cardiovascular;  Laterality: N/A;   BACK SURGERY  2009   BREAST SURGERY     COLONOSCOPY     [facility]   MASTECTOMY Left 2007   MASTECTOMY     TONSILLECTOMY       OB History   No obstetric history on file.     Family History  Problem Relation Age of Onset   Colon cancer Mother        age 25   Stroke Father    Cancer Sister        breast, bladder, kidney    Social History   Tobacco Use   Smoking status: Every Day    Packs/day: 1.00    Years: 65.00    Pack years: 65.00    Types: Cigarettes   Smokeless tobacco: Never   Tobacco comments:    currently smoking 1ppd 07/30/2016  Vaping Use   Vaping Use: Never used  Substance Use Topics   Alcohol use: Yes    Comment: occasionally  Drug use: No    Home Medications Prior to Admission medications   Medication Sig Start Date End Date Taking? Authorizing Provider  albuterol (PROVENTIL HFA;VENTOLIN HFA) 108 (90 Base) MCG/ACT inhaler Inhale 1-2 puffs into the lungs every 6 (six) hours as needed for wheezing or shortness of breath. 06/12/18   [place] Eric J, DO  amLODipine (NORVASC) 2.5 MG tablet Take 5 mg by mouth every evening. 03/12/20   [provider]  aspirin 81 MG tablet Take 81 mg by mouth daily.    [provider]  atorvastatin (LIPITOR) 10 MG tablet Take 1 tablet (10 mg total) by mouth daily. 04/14/20 04/14/21  Marty Heck, MD  B Complex-C (B-COMPLEX WITH VITAMIN C) tablet Take 1 tablet by mouth daily.    [provider]  clopidogrel (PLAVIX) 75 MG tablet Take 1 tablet (75 mg total) by mouth daily.  04/14/20 04/14/21  Marty Heck, MD  traMADol (ULTRAM) 50 MG tablet Take 25 mg by mouth daily as needed for moderate pain. Patient not taking: Reported on 05/17/2020    [provider]    Allergies    Sulfa drugs cross reactors, Tetanus-diphth-acell pertussis, Codeine, Metformin and related, and Sulfa antibiotics  Review of Systems   Review of Systems  Constitutional:  Negative for chills and fever.  HENT:  Positive for nosebleeds. Negative for congestion and rhinorrhea.   Eyes:  Negative for redness and visual disturbance.  Respiratory:  Negative for shortness of breath and wheezing.   Cardiovascular:  Negative for chest pain and palpitations.  Gastrointestinal:  Negative for nausea and vomiting.  Genitourinary:  Negative for dysuria and urgency.  Musculoskeletal:  Negative for arthralgias and myalgias.  Skin:  Negative for pallor and wound.  Neurological:  Negative for dizziness and headaches.   Physical Exam Updated Vital Signs BP (!) 154/69 (BP Location: Right Arm)   Pulse 81   Temp 98.8 F (37.1 C) (Oral)   Resp 15   SpO2 100%   Physical Exam Vitals and nursing note reviewed.  Constitutional:      General: She is not in acute distress.    Appearance: She is well-developed. She is not diaphoretic.  HENT:     Head: Normocephalic and atraumatic.     Nose:     Comments: Small clots in the anterior aspects of the naris on the right lung just along the rim.  No vascularity at Kiesselbach's plexus no obvious areas of bleeding. Eyes:     Pupils: Pupils are equal, round, and reactive to light.  Cardiovascular:     Rate and Rhythm: Normal rate and regular rhythm.     Heart sounds: No murmur heard.   No friction rub. No gallop.  Pulmonary:     Effort: Pulmonary effort is normal.     Breath sounds: No wheezing or rales.  Abdominal:     General: There is no distension.     Palpations: Abdomen is soft.     Tenderness: There is no abdominal tenderness.   Musculoskeletal:        General: No tenderness.     Cervical back: Normal range of motion and neck supple.  Skin:    General: Skin is warm and dry.  Neurological:     Mental Status: She is alert and oriented to person, place, and time.  Psychiatric:        Behavior: Behavior normal.    ED Results / Procedures / Treatments   Labs (all labs ordered are listed,  but only abnormal results are displayed) Labs Reviewed - No data to display  EKG None  Radiology No results found.  Procedures Procedures   Medications Ordered in ED Medications - No data to display  ED Course  I have reviewed the triage vital signs and the nursing notes.  Pertinent labs & imaging results that were available during my care of the patient were reviewed by me and considered in my medical decision making (see chart for details).    MDM Rules/Calculators/A&P                          86 yo F with a chief complaints of epistaxis.  This is resolved.  I do not see anything that I can cauterize.  Given measures that the patient can take at home if this rebleeds.  We will have her follow-up with her PCP.  Given ENT if needed.  11:19 PM:  I have discussed the diagnosis/risks/treatment options with the patient and family and believe the pt to be eligible for discharge home to follow-up with PCP. We also discussed returning to the ED immediately if new or worsening sx occur. We discussed the sx which are most concerning (e.g., sudden worsening pain, fever, inability to tolerate by mouth) that necessitate immediate return. Medications administered to the patient during their visit and any new prescriptions provided to the patient are listed below.  Medications given during this visit Medications - No data to display   The patient appears reasonably screen and/or stabilized for discharge and I doubt any other medical condition or other [facility] requiring further screening, evaluation, or treatment in the ED at this  time prior to discharge.   Final Clinical Impression(s) / ED Diagnoses Final diagnoses:  Epistaxis    Rx / DC Orders ED Discharge Orders     None        Deno Etienne, DO 09/07/20 2319

## 2020-10-04 ENCOUNTER — Encounter (HOSPITAL_BASED_OUTPATIENT_CLINIC_OR_DEPARTMENT_OTHER): Payer: Self-pay | Admitting: Obstetrics and Gynecology

## 2020-10-04 ENCOUNTER — Emergency Department (HOSPITAL_BASED_OUTPATIENT_CLINIC_OR_DEPARTMENT_OTHER)
Admission: EM | Admit: 2020-10-04 | Discharge: 2020-10-04 | Disposition: A | Discharge status: Home / Self Care | Payer: Medicare Other | Attending: Emergency Medicine | Admitting: Emergency Medicine

## 2020-10-04 ENCOUNTER — Other Ambulatory Visit: Payer: Self-pay

## 2020-10-04 DIAGNOSIS — J449 Chronic obstructive pulmonary disease, unspecified: Secondary | ICD-10-CM | POA: Diagnosis not present

## 2020-10-04 DIAGNOSIS — F17210 Nicotine dependence, cigarettes, uncomplicated: Secondary | ICD-10-CM | POA: Insufficient documentation

## 2020-10-04 DIAGNOSIS — I509 Heart failure, unspecified: Secondary | ICD-10-CM | POA: Insufficient documentation

## 2020-10-04 DIAGNOSIS — Z853 Personal history of malignant neoplasm of breast: Secondary | ICD-10-CM | POA: Diagnosis not present

## 2020-10-04 DIAGNOSIS — E119 Type 2 diabetes mellitus without complications: Secondary | ICD-10-CM | POA: Insufficient documentation

## 2020-10-04 DIAGNOSIS — Z79899 Other long term (current) drug therapy: Secondary | ICD-10-CM | POA: Insufficient documentation

## 2020-10-04 DIAGNOSIS — I110 Hypertensive heart disease with heart failure: Secondary | ICD-10-CM | POA: Diagnosis not present

## 2020-10-04 DIAGNOSIS — Z7902 Long term (current) use of antithrombotics/antiplatelets: Secondary | ICD-10-CM | POA: Insufficient documentation

## 2020-10-04 DIAGNOSIS — Z7982 Long term (current) use of aspirin: Secondary | ICD-10-CM | POA: Diagnosis not present

## 2020-10-04 DIAGNOSIS — R040 Epistaxis: Secondary | ICD-10-CM | POA: Diagnosis not present

## 2020-10-04 MED ORDER — OXYMETAZOLINE HCL 0.05 % NA SOLN
1.0000 | Freq: Once | NASAL | Status: AC
  Administered 2020-10-04: 1 via NASAL
  Filled 2020-10-04: qty 30

## 2020-10-04 NOTE — ED Triage Notes (Signed)
Patient reports to the ER for right nare bleeding. Patient seen for same in July

## 2020-10-04 NOTE — ED Provider Notes (Signed)
[facility] EMERGENCY DEPT Provider Note   CSN: [id number] Arrival date & time: 10/04/20  1933     History Chief Complaint  Patient presents with   Epistaxis    Linda Ray is a 86 y.o. female.  The history is provided by the patient.  Epistaxis Location:  R nare Severity:  Moderate Duration:  10 minutes Timing:  Constant Progression:  Resolved Chronicity:  Recurrent Context: hypertension   Context: not anticoagulants, not bleeding disorder, not home oxygen, not recent infection and not thrombocytopenia   Relieved by:  Applying pressure Exacerbated by: Blowing the nose. Ineffective treatments:  None tried Associated symptoms: congestion   Associated symptoms: no blood in oropharynx, no cough, no facial pain and no sinus pain   Risk factors comment:  Prior history of nosebleeds that required cauterization on the left side.  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   Abdominal aortic aneurysm ([place]) 04/05/2020   Essential hypertension 03/16/2020   Peripheral arterial disease ([place]) 03/16/2020   Malnutrition of moderate degree 10/20/2018   COPD with acute exacerbation ([place]) 10/18/2018   Iron deficiency anemia 10/18/2018   Thrombocytosis 10/18/2018   Acute CHF (congestive heart failure) ([place]) 10/18/2018   Pneumonia 06/12/2018   Hyponatremia 06/10/2018   Rash 06/10/2018   Myalgia 06/10/2018   Sinus tachycardia 06/10/2018   UTI (urinary tract infection) 06/10/2018   COPD, moderate ([place]) 07/27/2015   Smoking  history 05/31/2015   Incidental lung nodule, > 6m and < 867m04/05/2015   Other fatigue 05/31/2015   Breast cancer, left breast - lobular T2N0 12/05/2010    Past Surgical History:  Procedure Laterality Date   [place]AP[place] N/A 04/14/2020   Procedure: AORTIC ARCH ANGIOGRAPHY;  Surgeon: ClMarty HeckMD;  [facility];  Service: Cardiovascular;  Laterality: N/A;   BACK SURGERY  2009   BREAST SURGERY     COLONOSCOPY     [facility]   MASTECTOMY Left 2007   MASTECTOMY     TONSILLECTOMY       OB History   No obstetric history on file.     Family History  Problem Relation Age of Onset   Colon cancer Mother        age 84 Stroke Father    Cancer Sister        breast, bladder, kidney    Social History   Tobacco Use   Smoking status: Every Day    Packs/day: 1.00    Years: 65.00    Pack years: 65.00    Types: Cigarettes   Smokeless tobacco: Never   Tobacco comments:    currently smoking 1ppd 07/30/2016  Vaping Use   Vaping Use: Never used  Substance Use Topics   Alcohol use: Yes    Comment: occasionally   Drug use: No    Home Medications Prior to Admission medications   Medication Sig Start Date  End Date Taking? Authorizing Provider  albuterol (PROVENTIL HFA;VENTOLIN HFA) 108 (90 Base) MCG/ACT inhaler Inhale 1-2 puffs into the lungs every 6 (six) hours as needed for wheezing or shortness of breath. 06/12/18   [place] Eric J, DO  amLODipine (NORVASC) 2.5 MG tablet Take 5 mg by mouth every evening. 03/12/20   [provider]  aspirin 81 MG tablet Take 81 mg by mouth daily.    [provider]  atorvastatin (LIPITOR) 10 MG tablet Take 1 tablet (10 mg total) by mouth daily. 04/14/20 04/14/21  Marty Heck, MD  B Complex-C (B-COMPLEX WITH VITAMIN C) tablet Take 1 tablet by mouth daily.    [provider]  clopidogrel (PLAVIX) 75 MG tablet Take 1 tablet (75 mg total) by mouth daily.  04/14/20 04/14/21  Marty Heck, MD  traMADol (ULTRAM) 50 MG tablet Take 25 mg by mouth daily as needed for moderate pain. Patient not taking: Reported on 05/17/2020    [provider]    Allergies    Sulfa drugs cross reactors, Tetanus-diphth-acell pertussis, Codeine, Metformin and related, and Sulfa antibiotics  Review of Systems   Review of Systems  HENT:  Positive for congestion and nosebleeds. Negative for sinus pain.   Respiratory:  Negative for cough.   All other systems reviewed and are negative.  Physical Exam Updated Vital Signs BP (!) 178/80 (BP Location: Right Arm)   Pulse 94   Temp 98.2 F (36.8 C) (Oral)   Resp 16   SpO2 100%   Physical Exam Vitals and nursing note reviewed.  Constitutional:      General: She is not in acute distress.    Appearance: Normal appearance. She is normal weight.  HENT:     Head: Normocephalic and atraumatic.     Nose:     Comments: Prior epistaxis from the right nare but no evidence of bleeding vessel at this time.  No blood in the oropharynx Eyes:     Pupils: Pupils are equal, round, and reactive to light.  Cardiovascular:     Rate and Rhythm: Normal rate.     Pulses: Normal pulses.  Pulmonary:     Effort: Pulmonary effort is normal.  Musculoskeletal:     Cervical back: Normal range of motion and neck supple.  Neurological:     Mental Status: She is alert. Mental status is at baseline.  Psychiatric:        Mood and Affect: Mood normal.        Behavior: Behavior normal.    ED Results / Procedures / Treatments   Labs (all labs ordered are listed, but only abnormal results are displayed) Labs Reviewed - No data to display  EKG None  Radiology No results found.  Procedures Procedures   Medications Ordered in ED Medications  oxymetazoline (AFRIN) 0.05 % nasal spray 1 spray (1 spray Each Nare Given 10/04/20 2034)    ED Course  I have reviewed the triage vital signs and the nursing notes.  Pertinent  labs & imaging results that were available during my care of the patient were reviewed by me and considered in my medical decision making (see chart for details).    MDM Rules/Calculators/A&P                           Patient presenting with anterior right epistaxis which resolved by her applying pressure prior to being evaluated by myself.  No obvious bleeding vessel present currently.  No blood in the oropharynx.  Started when she blew her nose earlier as she has had some mild congestion.  Patient does not take anticoagulation and is otherwise well-appearing. To use Afrin twice a day for the next 2 to 3 days.  Also use Vaseline to keep the naris moist. Final Clinical Impression(s) / ED Diagnoses Final diagnoses:  Epistaxis    Rx / DC Orders ED Discharge Orders     None        Blanchie Dessert, MD 10/04/20 2153

## 2020-10-04 NOTE — ED Notes (Signed)
This RN presented the AVS utilizing Teachback Method. Patient verbalizes understanding of Discharge Instructions. Opportunity for Questioning and Answers were provided. Patient Discharged from ED ambulatory with [facility] with [facility].

## 2020-10-04 NOTE — Discharge Instructions (Addendum)
Use the Afrin 2 times a day for the next 2 or 3 days just to help the vessel constricting the bleeding to not restart.  Put Vaseline inside your nose to keep it moist

## 2020-10-04 NOTE — ED Notes (Signed)
Bleeding Controlled at this Time. Afrin administered.

## 2020-10-05 ENCOUNTER — Ambulatory Visit (INDEPENDENT_AMBULATORY_CARE_PROVIDER_SITE_OTHER): Payer: Medicare Other | Admitting: Cardiovascular Disease

## 2020-10-05 ENCOUNTER — Encounter: Payer: Self-pay | Admitting: Cardiovascular Disease

## 2020-10-05 VITALS — BP 154/66 | HR 85 | Ht 61.0 in | Wt 109.0 lb

## 2020-10-05 DIAGNOSIS — I10 Essential (primary) hypertension: Secondary | ICD-10-CM | POA: Diagnosis not present

## 2020-10-05 DIAGNOSIS — Z87891 Personal history of nicotine dependence: Secondary | ICD-10-CM

## 2020-10-05 DIAGNOSIS — I70208 Unspecified atherosclerosis of native arteries of extremities, other extremity: Secondary | ICD-10-CM | POA: Diagnosis not present

## 2020-10-05 DIAGNOSIS — I714 Abdominal aortic aneurysm, without rupture, unspecified: Secondary | ICD-10-CM

## 2020-10-05 DIAGNOSIS — I739 Peripheral vascular disease, unspecified: Secondary | ICD-10-CM

## 2020-10-05 DIAGNOSIS — R0989 Other specified symptoms and signs involving the circulatory and respiratory systems: Secondary | ICD-10-CM | POA: Diagnosis not present

## 2020-10-05 DIAGNOSIS — E785 Hyperlipidemia, unspecified: Secondary | ICD-10-CM | POA: Insufficient documentation

## 2020-10-05 DIAGNOSIS — E782 Mixed hyperlipidemia: Secondary | ICD-10-CM

## 2020-10-05 NOTE — Assessment & Plan Note (Signed)
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  She was sent to [facility] to see Dr. Sammuel Hines who felt that given her age and comorbidities surgical treatment of this was too high risk.

## 2020-10-05 NOTE — Assessment & Plan Note (Signed)
History of peripheral arterial disease status post left brachial PTA by Dr. Carlis Abbott 04/14/2020 for left hand ischemia.  Follow-up Dopplers performed 05/17/2020 revealed a widely patent brachial artery with by and triphasic waveforms.  Her symptoms have resolved.  She is on Plavix.

## 2020-10-05 NOTE — Assessment & Plan Note (Signed)
History of ongoing tobacco abuse of 1 pack/day recalcitrant to risk factor modification. 

## 2020-10-05 NOTE — Assessment & Plan Note (Signed)
History of essential hypertension blood pressure measured today at 154/66.  She is on amlodipine.

## 2020-10-05 NOTE — Patient Instructions (Signed)
Medication Instructions:  The current medical regimen is effective;  continue present plan and medications.  *If you need a refill on your cardiac medications before your next appointment, please call your pharmacy*   Testing/Procedures: Your physician has requested that you have a carotid duplex. This test is an ultrasound of the carotid arteries in your neck. It looks at blood flow through these arteries that supply the brain with blood. Allow one hour for this exam. There are no restrictions or special instructions.    Follow-Up: At [facility], you and your health needs are our priority.  As part of our continuing mission to provide you with exceptional heart care, we have created designated Provider Care Teams.  These Care Teams include your primary Cardiologist (physician) and Advanced Practice Providers (APPs -  Physician Assistants and Nurse Practitioners) who all work together to provide you with the care you need, when you need it.  We recommend signing up for the patient portal called "MyChart".  Sign up information is provided on this After Visit Summary.  MyChart is used to connect with patients for Virtual Visits (Telemedicine).  Patients are able to view lab/test results, encounter notes, upcoming appointments, etc.  Non-urgent messages can be sent to your provider as well.   To learn more about what you can do with MyChart, go to [email or web address].    Your next appointment:   12 month(s)  The format for your next appointment:   In Person  Provider:   Quay Burow, MD

## 2020-10-05 NOTE — Progress Notes (Signed)
10/05/2020 Linda Ray   December 23, 1934  [id number]  Primary Physician Vicenta Aly, [place] Primary Cardiologist: Lorretta Harp MD Lupe Carney, [place]  HPI:  Linda Ray is a 86 y.o.  thin appearing widowed Caucasian female mother of 2 biologic children, grandmother of 7 grandchildren is accompanied by her daughter Ivin Booty today.  She was referred by Melvyn Novas, FNP, for peripheral vascular valuation because of left upper extremity claudication.  I last saw her in the office 03/29/2020.  She is retired from being a Radiation protection practitioner.  Her risk factors include 70-pack-year tobacco abuse and treated hypertension.  She does have COPD.  She apparently saw Dr. Tamala Julian many years ago who did a heart catheterization that was normal.  She is never had a heart heart attack but apparently has had TIAs in the past.  She has COPD.  She has noticed left hand cyanosis's episodic over the last month or 2 with weakness and pain in her left upper extremity.  Dopplers performed at [facility] health 03/09/2020 showed left axillary stenosis.  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  I ultimately sent her to Dr. Carlis Abbott who performed PTA of her left brachial artery on 04/14/2020 with excellent angiographic and clinical result.  Follow-up Doppler studies performed 05/17/2020 revealed a widely patent brachial artery with triphasic waveforms and her symptoms resolved.  She was sent to see Dr. Sammuel Hines at [facility] because of her supraceliac aneurysm and penetrating ulcer who felt that surgery was too high risk given her age.  She denies chest pain or shortness of breath.   Current Meds  Medication Sig   albuterol (PROVENTIL HFA;VENTOLIN HFA) 108 (90 Base) MCG/ACT inhaler Inhale 1-2 puffs into the lungs every 6 (six) hours as needed for wheezing or shortness of breath.   amLODipine (NORVASC) 2.5 MG  tablet Take 5 mg by mouth every evening.   aspirin 81 MG tablet Take 81 mg by mouth daily.   atorvastatin (LIPITOR) 10 MG tablet Take 1 tablet (10 mg total) by mouth daily.   B Complex-C (B-COMPLEX WITH VITAMIN C) tablet Take 1 tablet by mouth daily.   clopidogrel (PLAVIX) 75 MG tablet Take 1 tablet (75 mg total) by mouth daily.   traMADol (ULTRAM) 50 MG tablet Take 25 mg by mouth daily as needed for moderate pain.     Allergies  Allergen Reactions   Sulfa Drugs Cross Reactors Nausea And Vomiting   Tetanus-Diphth-Acell Pertussis Swelling    Significant local reaction   Codeine Nausea And Vomiting   Metformin And Related Diarrhea   Sulfa Antibiotics Itching    Social History   Socioeconomic History   Marital status: Widowed    Spouse name: Not on file   Number of children: 2   Years of education: Not on file   Highest education level: Not on file  Occupational History   Occupation: retired  Tobacco Use   Smoking status: Every Day    Packs/day: 1.00    Years: 65.00    Pack years: 65.00    Types: Cigarettes   Smokeless tobacco: Never   Tobacco comments:    currently smoking 1ppd 07/30/2016  Vaping Use   Vaping Use: Never used  Substance and Sexual Activity   Alcohol use: Yes    Comment: occasionally   Drug use: No   Sexual activity: Not on file  Other Topics Concern   Not on file  Social History Narrative   Current Smoker, smoking 21-30 Cigarettes Daily   Alcohol, Yes - rare         Social Determinants of Health   Financial Resource Strain: Not on file  Food Insecurity: Not on file  Transportation Needs: Not on file  Physical Activity: Not on file  Stress: Not on file  Social Connections: Not on file  Intimate Partner Violence: Not on file     Review of Systems: General: negative for chills, fever, night sweats or weight changes.  Cardiovascular: negative for chest pain, dyspnea on exertion, edema, orthopnea, palpitations, paroxysmal nocturnal dyspnea or  shortness of breath Dermatological: negative for rash Respiratory: negative for cough or wheezing Urologic: negative for hematuria Abdominal: negative for nausea, vomiting, diarrhea, bright red blood per rectum, melena, or hematemesis Neurologic: negative for visual changes, syncope, or dizziness All other systems reviewed and are otherwise negative except as noted above.    Blood pressure (!) 154/66, pulse 85, height '5\' 1"'$  (1.549 m), weight 109 lb (49.4 kg), SpO2 94 %.  General appearance: alert and no distress Neck: no adenopathy, no JVD, supple, symmetrical, trachea midline, thyroid not enlarged, symmetric, no tenderness/mass/nodules, and bilateral carotid bruits Lungs: clear to auscultation bilaterally Heart: regular rate and rhythm, S1, S2 normal, no murmur, click, rub or gallop Extremities: extremities normal, atraumatic, no cyanosis or edema Pulses: 2+ and symmetric Skin: Skin color, texture, turgor normal. No rashes or lesions Neurologic: Grossly normal  EKG sinus rhythm at 85 without ST or T wave changes.  Personally reviewed this EKG.  ASSESSMENT AND PLAN:   Smoking history History of ongoing tobacco abuse of 1 pack/day recalcitrant to risk factor modification.  Essential hypertension History of essential hypertension blood pressure measured today at 154/66.  She is on amlodipine.  Peripheral arterial disease (HCC) History of peripheral arterial disease status post left brachial PTA by Dr. Carlis Abbott 04/14/2020 for left hand ischemia.  Follow-up Dopplers performed 05/17/2020 revealed a widely patent brachial artery with by and triphasic waveforms.  Her symptoms have resolved.  She is on Plavix.  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  She was sent to [facility] to see Dr. Sammuel Hines who felt that given her age and comorbidities surgical treatment of this was too high risk.  Hyperlipidemia History of hyperlipidemia on statin  therapy with lipid profile performed 03/16/2020 revealing total cholesterol of 157, LDL of 71 and HDL 69.     Lorretta Harp MD [facility], [facility] 10/05/2020 11:23 AM

## 2020-10-05 NOTE — Assessment & Plan Note (Signed)
History of hyperlipidemia on statin therapy with lipid profile performed 03/16/2020 revealing total cholesterol of 157, LDL of 71 and HDL 69.

## 2020-10-13 ENCOUNTER — Ambulatory Visit (HOSPITAL_COMMUNITY)
Admission: RE | Admit: 2020-10-13 | Payer: Medicare Other | Source: Ambulatory Visit | Attending: Cardiovascular Disease | Admitting: Cardiovascular Disease

## 2020-10-14 ENCOUNTER — Encounter (HOSPITAL_COMMUNITY): Payer: Medicare Other

## 2020-10-27 ENCOUNTER — Other Ambulatory Visit: Payer: Self-pay

## 2020-10-27 ENCOUNTER — Ambulatory Visit (HOSPITAL_COMMUNITY)
Admission: RE | Admit: 2020-10-27 | Discharge: 2020-10-27 | Disposition: A | Discharge status: Home / Self Care | Payer: Medicare Other | Source: Ambulatory Visit | Attending: Internal Medicine | Admitting: Internal Medicine

## 2020-10-27 ENCOUNTER — Other Ambulatory Visit (HOSPITAL_COMMUNITY): Payer: Self-pay

## 2020-10-27 DIAGNOSIS — R0989 Other specified symptoms and signs involving the circulatory and respiratory systems: Secondary | ICD-10-CM | POA: Insufficient documentation

## 2020-10-27 DIAGNOSIS — I6523 Occlusion and stenosis of bilateral carotid arteries: Secondary | ICD-10-CM

## 2020-10-28 ENCOUNTER — Other Ambulatory Visit (HOSPITAL_COMMUNITY): Payer: Self-pay | Admitting: Cardiovascular Disease

## 2020-10-28 DIAGNOSIS — I6523 Occlusion and stenosis of bilateral carotid arteries: Secondary | ICD-10-CM

## 2020-11-07 ENCOUNTER — Telehealth: Payer: Self-pay | Admitting: Cardiovascular Disease

## 2020-11-07 NOTE — Telephone Encounter (Signed)
Returned call to patient, made patient aware of the following results:  Linda Harp, MD  10/28/2020  3:06 PM EDT     Mod Bilat ICA stenosis. Repeat 12 months    Advised patient to call back to office with any issues, questions, or concerns. Patient verbalized understanding.

## 2020-11-07 NOTE — Telephone Encounter (Signed)
Patient is returning call to discuss carotid results. 

## 2021-05-08 ENCOUNTER — Other Ambulatory Visit: Payer: Self-pay | Admitting: *Deleted

## 2021-05-08 DIAGNOSIS — I70208 Unspecified atherosclerosis of native arteries of extremities, other extremity: Secondary | ICD-10-CM

## 2021-05-23 ENCOUNTER — Other Ambulatory Visit: Payer: Self-pay

## 2021-05-23 ENCOUNTER — Ambulatory Visit (HOSPITAL_COMMUNITY)
Admission: RE | Admit: 2021-05-23 | Discharge: 2021-05-23 | Disposition: A | Discharge status: Home / Self Care | Payer: Medicare Other | Source: Ambulatory Visit | Attending: Vascular Surgery | Admitting: Vascular Surgery

## 2021-05-23 ENCOUNTER — Ambulatory Visit (INDEPENDENT_AMBULATORY_CARE_PROVIDER_SITE_OTHER): Payer: Medicare Other | Admitting: Vascular Surgery

## 2021-05-23 VITALS — BP 159/73 | HR 85

## 2021-05-23 DIAGNOSIS — I70208 Unspecified atherosclerosis of native arteries of extremities, other extremity: Secondary | ICD-10-CM | POA: Diagnosis not present

## 2021-05-23 DIAGNOSIS — I7141 Pararenal abdominal aortic aneurysm, without rupture: Secondary | ICD-10-CM

## 2021-05-23 NOTE — Progress Notes (Signed)
? ? ?Patient name: Linda Ray MRN: [id number] DOB: June 25, 1934 Sex: female ? ?REASON FOR CONSULT: 1 year follow-up for surveillance of left brachial artery angioplasty ? ?HPI: ?Linda Ray is a 87 y.o. female, with history of diabetes, hypertension, COPD that presents for 1 year follow-up of left brachial artery angioplasty for brachial artery stenosis on 04/14/2020.  No new complaints today.  States her left hand is normal color and she no longer has any blue discoloration.  No arm fatigue. ? ?She previously reported that her hand had been turning blue and she had also noticed fatigue in her left arm where her muscles cramp whenever she was using the left arm.  She got a CTA left upper extremity on 03/25/2020 that suggest a high-grade proximal left brachial artery stenosis.  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   ? ?I then referred her to [facility] for custom modified four-vessel fenestration to treat her supraceliac aorta.  I did get a note from Dr. Sammuel Ray and ultimately she has elected continued observation and a four-vessel endograft would require iliac conduit and she did not want to take on the risks of cardiac and pulmonary complications. ? ?Past Medical History:  ?Diagnosis Date  ? Anemia   ? Arthritis   ? Borderline diabetes   ? Breast cancer ([place])   ? CHF (congestive heart failure) ([place])   ? Chronic back pain   ? COPD (chronic obstructive pulmonary disease) ([place])   ? Diabetes mellitus without complication ([place])   ? Hypertension   ? RLS (restless legs syndrome)   ? ? ?Past Surgical History:  ?Procedure Laterality Date  ? ABDOMINAL SURGERY  1954  ? AORTIC ARCH ANGIOGRAPHY N/A 04/14/2020  ? Procedure: AORTIC ARCH ANGIOGRAPHY;  Surgeon: Marty Heck, MD;  Location: [facility];  Service: Cardiovascular;  Laterality: N/A;  ? BACK SURGERY  2009  ? BREAST SURGERY    ? COLONOSCOPY    ? [facility]  ? MASTECTOMY Left 2007  ? MASTECTOMY    ?  TONSILLECTOMY    ? ? ?Family History  ?Problem Relation Age of Onset  ? Colon cancer Mother   ?     age 35  ? Stroke Father   ? Cancer Sister   ?     breast, bladder, kidney  ? ? ?SOCIAL HISTORY: ?Social History  ? ?Socioeconomic History  ? Marital status: Widowed  ?  Spouse name: Not on file  ? Number of children: 2  ? Years of education: Not on file  ? Highest education level: Not on file  ?Occupational History  ? Occupation: retired  ?Tobacco Use  ? Smoking status: Every Day  ?  Packs/day: 1.00  ?  Years: 65.00  ?  Pack years: 65.00  ?  Types: Cigarettes  ? Smokeless tobacco: Never  ? Tobacco comments:  ?  currently smoking 1ppd 07/30/2016  ?Vaping Use  ? Vaping Use: Never used  ?Substance and Sexual Activity  ? Alcohol use: Yes  ?  Comment: occasionally  ? Drug use: No  ? Sexual activity: Not on file  ?Other Topics Concern  ? Not on file  ?Social History Narrative  ? Current Smoker, smoking 21-30 Cigarettes Daily  ? Alcohol, Yes - rare  ?   ?   ? ?Social Determinants of Health  ? ?Financial Resource Strain: Not on file  ?Food Insecurity: Not on file  ?Transportation Needs: Not on  file  ?Physical Activity: Not on file  ?Stress: Not on file  ?Social Connections: Not on file  ?Intimate Partner Violence: Not on file  ? ? ?Allergies  ?Allergen Reactions  ? Sulfa Drugs Cross Reactors Nausea And Vomiting  ? Tetanus-Diphth-Acell Pertussis Swelling  ?  Significant local reaction  ? Codeine Nausea And Vomiting  ? Metformin And Related Diarrhea  ? Sulfa Antibiotics Itching  ? ? ?Current Outpatient Medications  ?Medication Sig Dispense Refill  ? albuterol (PROVENTIL HFA;VENTOLIN HFA) 108 (90 Base) MCG/ACT inhaler Inhale 1-2 puffs into the lungs every 6 (six) hours as needed for wheezing or shortness of breath. 1 each 0  ? amLODipine (NORVASC) 2.5 MG tablet Take 5 mg by mouth every evening.    ? aspirin 81 MG tablet Take 81 mg by mouth daily.    ? atorvastatin (LIPITOR) 10 MG tablet Take 1 tablet (10 mg total) by mouth daily.  30 tablet 11  ? B Complex-C (B-COMPLEX WITH VITAMIN C) tablet Take 1 tablet by mouth daily.    ? budesonide-formoterol (SYMBICORT) 160-4.5 MCG/ACT inhaler Inhale 2 puffs into the lungs 2 (two) times daily.    ? traMADol (ULTRAM) 50 MG tablet Take 25 mg by mouth daily as needed for moderate pain.    ? ?No current facility-administered medications for this visit.  ? ? ?REVIEW OF SYSTEMS:  ?'[X]'$  denotes positive finding, '[ ]'$  denotes negative finding ?Cardiac  Comments:  ?Chest pain or chest pressure:    ?Shortness of breath upon exertion:    ?Short of breath when lying flat:    ?Irregular heart rhythm:    ?    ?Vascular    ?Pain in calf, thigh, or hip brought on by ambulation:    ?Pain in feet at night that wakes you up from your sleep:     ?Blood clot in your veins:    ?Leg swelling:     ?    ?Pulmonary    ?Oxygen at home:    ?Productive cough:     ?Wheezing:     ?    ?Neurologic    ?Sudden weakness in arms or legs:     ?Sudden numbness in arms or legs:     ?Sudden onset of difficulty speaking or slurred speech:    ?Temporary loss of vision in one eye:     ?Problems with dizziness:     ?    ?Gastrointestinal    ?Blood in stool:     ?Vomited blood:     ?    ?Genitourinary    ?Burning when urinating:     ?Blood in urine:    ?    ?Psychiatric    ?Major depression:     ?    ?Hematologic    ?Bleeding problems:    ?Problems with blood clotting too easily:    ?    ?Skin    ?Rashes or ulcers:    ?    ?Constitutional    ?Fever or chills:    ? ? ?PHYSICAL EXAM: ?Vitals:  ? 05/23/21 1453 05/23/21 1455  ?BP: (!) 169/73 (!) 159/73  ?Pulse: 85 85  ? ? ?GENERAL: The patient is a well-nourished female, in no acute distress. The vital signs are documented above. ?CARDIAC: There is a regular rate and rhythm.  ?VASCULAR:  ?Left radial pulse palpable ?Left hand normal color, good capillary refill ? ? ?DATA:  ? ?Left upper extremity arterial duplex concerning for high-grade stenosis in the axillary artery with a  velocity of  650. ? ?Assessment/Plan: ? ?87 year old female presents for 1 year follow-up after left brachial artery angioplasty for left brachial artery stenosis last year with arm fatigue and discoloration in the hand.  Her duplex today suggest a high-grade stenosis in the axillary artery which is proximal to her previously treated segment.  She still has an easily palpable radial pulse at the left wrist.  She is having no recurrent left arm fatigue or discoloration of the digits.  Even with the duplex findings today, I do not see any role for further intervention given she is asymptomatic.  I discussed that she stay on aspirin statin for risk reduction. ? ?I previously sent her to Dr. Sammuel Ray for evaluation of four-vessel fenestration given her supraceliac aneurysm.  She has elected no intervention after discussion with Dr. Sammuel Ray.  She would like to continue surveillance here in [place].  She was due for a follow-up CT in August 2023 at [facility] and would prefer to stay here in [place].  We discussed if there would be any value in getting a repeat scan if she does not want intervention and she does feel it would help her mentally to know about its growth.  ? ? ?Marty Heck, MD ?[facility] ?Office: [phone] ? ? ? ? ?

## 2021-10-04 ENCOUNTER — Other Ambulatory Visit: Payer: Self-pay

## 2021-10-04 DIAGNOSIS — I7141 Pararenal abdominal aortic aneurysm, without rupture: Secondary | ICD-10-CM

## 2021-10-05 ENCOUNTER — Other Ambulatory Visit: Payer: Self-pay

## 2021-10-05 NOTE — Progress Notes (Signed)
Error

## 2021-10-27 ENCOUNTER — Ambulatory Visit
Admission: RE | Admit: 2021-10-27 | Discharge: 2021-10-27 | Disposition: A | Discharge status: Home / Self Care | Payer: Medicare Other | Source: Ambulatory Visit | Attending: Vascular Surgery | Admitting: Vascular Surgery

## 2021-10-27 ENCOUNTER — Inpatient Hospital Stay (HOSPITAL_COMMUNITY): Admission: RE | Admit: 2021-10-27 | Payer: Medicare Other | Source: Ambulatory Visit

## 2021-10-27 DIAGNOSIS — I7141 Pararenal abdominal aortic aneurysm, without rupture: Secondary | ICD-10-CM

## 2021-10-27 MED ORDER — IOPAMIDOL (ISOVUE-370) INJECTION 76%
75.0000 mL | Freq: Once | INTRAVENOUS | Status: AC | PRN
  Administered 2021-10-27: 75 mL via INTRAVENOUS

## 2021-10-31 ENCOUNTER — Ambulatory Visit (INDEPENDENT_AMBULATORY_CARE_PROVIDER_SITE_OTHER): Payer: Medicare Other | Admitting: Vascular Surgery

## 2021-10-31 ENCOUNTER — Encounter: Payer: Self-pay | Admitting: Vascular Surgery

## 2021-10-31 VITALS — BP 155/64 | HR 74 | Temp 97.5°F | Resp 14 | Ht 61.0 in | Wt 101.0 lb

## 2021-10-31 DIAGNOSIS — I7141 Pararenal abdominal aortic aneurysm, without rupture: Secondary | ICD-10-CM | POA: Diagnosis not present

## 2021-10-31 DIAGNOSIS — I70208 Unspecified atherosclerosis of native arteries of extremities, other extremity: Secondary | ICD-10-CM

## 2021-10-31 NOTE — Progress Notes (Signed)
Patient name: Linda Ray MRN: [id number] DOB: 05-23-1934 Sex: female  REASON FOR CONSULT: 1 year follow-up for surveillance of left brachial artery angioplasty/PAU with supra-celiac aortic aneurysm  HPI: Linda Ray is a 87 y.o. 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  No new complaints today.  States her left hand is normal color and she no longer has any blue discoloration.  No arm fatigue.  She previously reported that her hand had been turning blue and she had also noticed fatigue in her left arm where her muscles cramp whenever she was using the left arm.  She got a CTA left upper extremity on 03/25/2020 that suggest a high-grade proximal left brachial artery stenosis.    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  I then referred her to [facility] for custom modified four-vessel fenestration to treat her supraceliac aorta.  I did get a note from Dr. Sammuel Hines and ultimately she has elected continued observation and a four-vessel endograft would require iliac conduit and she did not want to take on the risks of cardiac and pulmonary complications.  Past Medical History:  Diagnosis Date   Anemia    Arthritis    Borderline diabetes    Breast cancer (HCC)    CHF (congestive heart failure) (HCC)    Chronic back pain    COPD (chronic obstructive pulmonary disease) (HCC)    Diabetes mellitus without complication (HCC)    Hypertension    RLS (restless legs syndrome)     Past Surgical History:  Procedure Laterality Date   [place]   AORTIC ARCH ANGIOGRAPHY N/A 04/14/2020   Procedure: AORTIC ARCH ANGIOGRAPHY;  Surgeon: Marty Heck, MD;  Location: [place] [facility];  Service: Cardiovascular;  Laterality: N/A;   BACK SURGERY  2009   BREAST  SURGERY     COLONOSCOPY     [facility]   MASTECTOMY Left 2007   MASTECTOMY     TONSILLECTOMY      Family History  Problem Relation Age of Onset   Colon cancer Mother        age 68   Stroke Father    Cancer Sister        breast, bladder, kidney    SOCIAL HISTORY: Social History   Socioeconomic History   Marital status: Widowed    Spouse name: Not on file   Number of children: 2   Years of education: Not on file   Highest education level: Not on file  Occupational History   Occupation: retired  Tobacco Use   Smoking status: Every Day    Packs/day: 1.00    Years: 65.00    Total pack years: 65.00    Types: Cigarettes   Smokeless tobacco: Never   Tobacco comments:    currently smoking 1ppd 07/30/2016  Vaping Use   Vaping Use: Never used  Substance and Sexual Activity   Alcohol use: Yes    Comment: occasionally   Drug use: No   Sexual activity: Not on file  Other Topics Concern   Not on file  Social History Narrative   Current Smoker, smoking 21-30 Cigarettes Daily   Alcohol, Yes - rare         Social Determinants of Health   Financial Resource Strain: Not on  file  Food Insecurity: Not on file  Transportation Needs: Not on file  Physical Activity: Not on file  Stress: Not on file  Social Connections: Not on file  Intimate Partner Violence: Not on file    Allergies  Allergen Reactions   Sulfa Drugs Cross Reactors Nausea And Vomiting   Tetanus-Diphth-Acell Pertussis Swelling    Significant local reaction   Codeine Nausea And Vomiting   Metformin And Related Diarrhea   Sulfa Antibiotics Itching    Current Outpatient Medications  Medication Sig Dispense Refill   albuterol (PROVENTIL HFA;VENTOLIN HFA) 108 (90 Base) MCG/ACT inhaler Inhale 1-2 puffs into the lungs every 6 (six) hours as needed for wheezing or shortness of breath. 1 each 0   amLODipine (NORVASC) 2.5 MG tablet Take 5 mg by mouth every evening.     aspirin 81 MG tablet Take 81 mg by mouth  daily.     B Complex-C (B-COMPLEX WITH VITAMIN C) tablet Take 1 tablet by mouth daily.     atorvastatin (LIPITOR) 10 MG tablet Take 1 tablet (10 mg total) by mouth daily. 30 tablet 11   budesonide-formoterol (SYMBICORT) 160-4.5 MCG/ACT inhaler Inhale 2 puffs into the lungs 2 (two) times daily. (Patient not taking: Reported on 10/31/2021)     traMADol (ULTRAM) 50 MG tablet Take 25 mg by mouth daily as needed for moderate pain. (Patient not taking: Reported on 10/31/2021)     No current facility-administered medications for this visit.    REVIEW OF SYSTEMS:  '[X]'$  denotes positive finding, '[ ]'$  denotes negative finding Cardiac  Comments:  Chest pain or chest pressure:    Shortness of breath upon exertion:    Short of breath when lying flat:    Irregular heart rhythm:        Vascular    Pain in calf, thigh, or hip brought on by ambulation:    Pain in feet at night that wakes you up from your sleep:     Blood clot in your veins:    Leg swelling:         Pulmonary    Oxygen at home:    Productive cough:     Wheezing:         Neurologic    Sudden weakness in arms or legs:     Sudden numbness in arms or legs:     Sudden onset of difficulty speaking or slurred speech:    Temporary loss of vision in one eye:     Problems with dizziness:         Gastrointestinal    Blood in stool:     Vomited blood:         Genitourinary    Burning when urinating:     Blood in urine:        Psychiatric    Major depression:         Hematologic    Bleeding problems:    Problems with blood clotting too easily:        Skin    Rashes or ulcers:        Constitutional    Fever or chills:      PHYSICAL EXAM: Vitals:   10/31/21 1034  BP: (!) 155/64  Pulse: 74  Resp: 14  Temp: (!) 97.5 F (36.4 C)  TempSrc: Temporal  SpO2: 92%  Weight: 101 lb (45.8 kg)  Height: '5\' 1"'$  (1.549 m)    GENERAL: The patient is a well-nourished female, in no acute distress. The  vital signs are documented  above. CARDIAC: There is a regular rate and rhythm.  VASCULAR:  Left radial pulse palpable Left hand normal color, good capillary refill, no left hand tissue loss Abdomen: No pain with palpation   DATA:      Assessment/Plan:  87 year old female presents for 1 year follow-up after left brachial artery angioplasty for left brachial artery stenosis on 04/14/20 and surveillance of PAU with supra-celiac aneurysm.  She has a palpable left radial pulse at the wrist.  She is no longer having arm fatigue.  Color in the left hand is much improved.  I did not order any additional imaging today as we are following her clinical exam.  There was concern for recurrent stenosis in the axillary artery last year but again her exam was much improved and we have elected for observation.  I previously sent her to Dr. Sammuel Hines for evaluation of four-vessel fenestration given her supraceliac aneurysm.  She has elected no intervention after discussion with Dr. Sammuel Hines.  She has elected for continued surveillance here.  She wants imaging as she feels the growth of the aneurysm will help her decide how to live life.  I will order another CT in 1 year with CTA chest abdomen pelvis.  Discussed after reviewing the imaging today, I do not see any significant growth compared to a CT chest from 12/2019.  Maximal diameter remains 3.6 cm as pictured above.  Marty Heck, MD [facility] [place] Office: [phone]

## 2022-09-13 DIAGNOSIS — E1165 Type 2 diabetes mellitus with hyperglycemia: Secondary | ICD-10-CM | POA: Insufficient documentation

## 2022-10-26 ENCOUNTER — Other Ambulatory Visit: Payer: Self-pay

## 2022-10-26 DIAGNOSIS — I70208 Unspecified atherosclerosis of native arteries of extremities, other extremity: Secondary | ICD-10-CM

## 2022-10-26 DIAGNOSIS — I7141 Pararenal abdominal aortic aneurysm, without rupture: Secondary | ICD-10-CM

## 2022-11-14 ENCOUNTER — Ambulatory Visit
Admission: RE | Admit: 2022-11-14 | Discharge: 2022-11-14 | Disposition: A | Discharge status: Home / Self Care | Payer: Medicare Other | Source: Ambulatory Visit | Attending: Vascular Surgery | Admitting: Vascular Surgery

## 2022-11-14 DIAGNOSIS — I7141 Pararenal abdominal aortic aneurysm, without rupture: Secondary | ICD-10-CM

## 2022-11-14 DIAGNOSIS — I70208 Unspecified atherosclerosis of native arteries of extremities, other extremity: Secondary | ICD-10-CM

## 2022-11-14 MED ORDER — IOPAMIDOL (ISOVUE-370) INJECTION 76%
80.0000 mL | Freq: Once | INTRAVENOUS | Status: AC | PRN
  Administered 2022-11-14: 80 mL via INTRAVENOUS

## 2022-11-19 NOTE — Progress Notes (Unsigned)
Patient name: Linda Ray MRN: [id number] DOB: 1934/11/13 Sex: female  REASON FOR CONSULT: 1 year follow-up for surveillance PAU with supra-celiac aortic aneurysm  HPI: Linda Ray is a 88 y.o. female, with history of diabetes, hypertension, COPD that presents for 1 year follow-up of PAU with supra-celiac aortic aneurysm.  Reports no new abdominal or back pain.  Wants to continue surveillance of her aneurysm as it helps her determine decisions in her life.  I previously referred her to [facility] for custom modified four-vessel fenestration to treat her supraceliac aorta.  I did get a note from Dr. Pattricia Boss and ultimately she has elected continued observation and a four-vessel endograft would require iliac conduit and she did not want to take on the risks of cardiac and pulmonary complications. We have also performed previous left brachial angioplasty.  Arm is doing great with no complaints.  Past Medical History:  Diagnosis Date   Anemia    Arthritis    Borderline diabetes    Breast cancer (HCC)    CHF (congestive heart failure) (HCC)    Chronic back pain    COPD (chronic obstructive pulmonary disease) (HCC)    Diabetes mellitus without complication (HCC)    Hypertension    RLS (restless legs syndrome)     Past Surgical History:  Procedure Laterality Date   ABDOMINAL SURGERY  1954   AORTIC ARCH ANGIOGRAPHY N/A 04/14/2020   Procedure: AORTIC ARCH ANGIOGRAPHY;  Surgeon: Cephus Shelling, MD;  Location: [facility];  Service: Cardiovascular;  Laterality: N/A;   BACK SURGERY  2009   BREAST SURGERY     COLONOSCOPY     [facility]   MASTECTOMY Left 2007   MASTECTOMY     TONSILLECTOMY      Family History  Problem Relation Age of Onset   Colon cancer Mother        age 43   Stroke Father    Cancer Sister        breast, bladder, kidney    SOCIAL HISTORY: Social History   Socioeconomic History   Marital status: Widowed    Spouse name: Not on file   Number of children: 2    Years of education: Not on file   Highest education level: Not on file  Occupational History   Occupation: retired  Tobacco Use   Smoking status: Every Day    Current packs/day: 1.00    Average packs/day: 1 pack/day for 65.0 years (65.0 ttl pk-yrs)    Types: Cigarettes   Smokeless tobacco: Never   Tobacco comments:    currently smoking 1ppd 07/30/2016  Vaping Use   Vaping status: Never Used  Substance and Sexual Activity   Alcohol use: Yes    Comment: occasionally   Drug use: No   Sexual activity: Not on file  Other Topics Concern   Not on file  Social History Narrative   Current Smoker, smoking 21-30 Cigarettes Daily   Alcohol, Yes - rare         Social Determinants of Health   Financial Resource Strain: Low Risk  (09/13/2022)   Received from [facility]   Overall Financial Resource Strain (CARDIA)    Difficulty of Paying Living Expenses: Not hard at all  Food Insecurity: No Food Insecurity (09/13/2022)   Received from [facility]   Hunger Vital Sign    Worried About Running Out of Food in the Last Year: Never true    Ran Out of Food in  the Last Year: Never true  Transportation Needs: No Transportation Needs (09/13/2022)   Received from [facility] - Transportation    Lack of Transportation (Medical): No    Lack of Transportation (Non-Medical): No  Physical Activity: Unknown (09/13/2022)   Received from [facility]   Exercise Vital Sign    Days of Exercise per Week: 0 days    Minutes of Exercise per Session: Not on file  Stress: No Stress Concern Present (09/13/2022)   Received from [facility] - Occupational Stress Questionnaire    Feeling of Stress : Not at all  Social Connections: Socially Integrated (09/13/2022)   Received from [facility]   Social Network    How would you rate your social network (family, work, friends)?: Good participation with social networks  Intimate Partner Violence: Not At  Risk (09/13/2022)   Received from [facility]   HITS    Over the last 12 months how often did your partner physically hurt you?: 1    Over the last 12 months how often did your partner insult you or talk down to you?: 1    Over the last 12 months how often did your partner threaten you with physical harm?: 1    Over the last 12 months how often did your partner scream or curse at you?: 1    Allergies  Allergen Reactions   Sulfa Drugs Cross Reactors Nausea And Vomiting   Tetanus-Diphth-Acell Pertussis Swelling    Significant local reaction   Codeine Nausea And Vomiting   Metformin And Related Diarrhea   Sulfa Antibiotics Itching    Current Outpatient Medications  Medication Sig Dispense Refill   albuterol (PROVENTIL HFA;VENTOLIN HFA) 108 (90 Base) MCG/ACT inhaler Inhale 1-2 puffs into the lungs every 6 (six) hours as needed for wheezing or shortness of breath. 1 each 0   amLODipine (NORVASC) 2.5 MG tablet Take 5 mg by mouth every evening.     aspirin 81 MG tablet Take 81 mg by mouth daily.     atorvastatin (LIPITOR) 10 MG tablet Take 1 tablet (10 mg total) by mouth daily. 30 tablet 11   B Complex-C (B-COMPLEX WITH VITAMIN C) tablet Take 1 tablet by mouth daily.     budesonide-formoterol (SYMBICORT) 160-4.5 MCG/ACT inhaler Inhale 2 puffs into the lungs 2 (two) times daily. (Patient not taking: Reported on 10/31/2021)     traMADol (ULTRAM) 50 MG tablet Take 25 mg by mouth daily as needed for moderate pain. (Patient not taking: Reported on 10/31/2021)     No current facility-administered medications for this visit.    REVIEW OF SYSTEMS:  [X]  denotes positive finding, [ ]  denotes negative finding Cardiac  Comments:  Chest pain or chest pressure:    Shortness of breath upon exertion:    Short of breath when lying flat:    Irregular heart rhythm:        Vascular    Pain in calf, thigh, or hip brought on by ambulation:    Pain in feet at night that wakes you up from your sleep:      Blood clot in your veins:    Leg swelling:         Pulmonary    Oxygen at home:    Productive cough:     Wheezing:         Neurologic    Sudden weakness in arms or legs:     Sudden numbness in  arms or legs:     Sudden onset of difficulty speaking or slurred speech:    Temporary loss of vision in one eye:     Problems with dizziness:         Gastrointestinal    Blood in stool:     Vomited blood:         Genitourinary    Burning when urinating:     Blood in urine:        Psychiatric    Major depression:         Hematologic    Bleeding problems:    Problems with blood clotting too easily:        Skin    Rashes or ulcers:        Constitutional    Fever or chills:      PHYSICAL EXAM: There were no vitals filed for this visit.   GENERAL: The patient is a well-nourished female, in no acute distress. The vital signs are documented above. CARDIAC: There is a regular rate and rhythm.  VASCULAR:  Left radial pulse palpable Bilateral femoral pulses palpable Abdomen: No pain with palpation  DATA:   CTA C/A/P 11/14/22:   Max diameter 3.6 cm supra-celiac aneurysm    Assessment/Plan:  20 - year-old female presents for 1 year follow-up of PAU with supra-celiac aneurysm.  I reviewed her most recent CTA with the patient and there has been some slight increase in the width with thrombus in the saccular aneurysm now as pictured.  Overall these are not significant changes.  She is not interested in repair and I previously sent her to Dr. Pattricia Boss for evaluation of four-vessel fenestration.  She understands given the location of her aneurysm would be difficult to treat with traditional endograft.  Will continue surveillance with another CTA in 1 year as she is using this information to determine decisions about how she lives her life.  Left arm is doing great after previous brachial angioplasty with a palpable radial pulse.  We will follow this clinically.    Cephus Shelling, MD [facility] [place] Office: [phone]

## 2022-11-20 ENCOUNTER — Encounter: Payer: Self-pay | Admitting: Vascular Surgery

## 2022-11-20 ENCOUNTER — Ambulatory Visit (INDEPENDENT_AMBULATORY_CARE_PROVIDER_SITE_OTHER): Payer: Medicare Other | Admitting: Vascular Surgery

## 2022-11-20 VITALS — BP 149/62 | HR 86 | Temp 97.6°F | Wt 101.0 lb

## 2022-11-20 DIAGNOSIS — I7141 Pararenal abdominal aortic aneurysm, without rupture: Secondary | ICD-10-CM | POA: Diagnosis not present

## 2023-10-23 ENCOUNTER — Other Ambulatory Visit: Payer: Self-pay

## 2023-10-23 DIAGNOSIS — I7141 Pararenal abdominal aortic aneurysm, without rupture: Secondary | ICD-10-CM

## 2023-11-27 ENCOUNTER — Ambulatory Visit (HOSPITAL_BASED_OUTPATIENT_CLINIC_OR_DEPARTMENT_OTHER)
Admission: RE | Admit: 2023-11-27 | Discharge: 2023-11-27 | Disposition: A | Discharge status: Home / Self Care | Source: Ambulatory Visit | Attending: Vascular Surgery | Admitting: Vascular Surgery

## 2023-11-27 DIAGNOSIS — I7141 Pararenal abdominal aortic aneurysm, without rupture: Secondary | ICD-10-CM | POA: Diagnosis present

## 2023-11-27 LAB — POCT I-STAT CREATININE: Creatinine, Ser: 1.3 mg/dL — ABNORMAL HIGH (ref 0.44–1.00)

## 2023-11-27 MED ORDER — IOHEXOL 350 MG/ML SOLN
100.0000 mL | Freq: Once | INTRAVENOUS | Status: AC | PRN
  Administered 2023-11-27: 100 mL via INTRAVENOUS

## 2023-12-03 ENCOUNTER — Ambulatory Visit: Attending: Vascular Surgery | Admitting: Vascular Surgery

## 2023-12-03 ENCOUNTER — Encounter: Payer: Self-pay | Admitting: Vascular Surgery

## 2023-12-03 VITALS — BP 137/64 | HR 75 | Temp 98.0°F | Resp 18 | Ht 61.0 in | Wt 95.4 lb

## 2023-12-03 DIAGNOSIS — I7141 Pararenal abdominal aortic aneurysm, without rupture: Secondary | ICD-10-CM | POA: Diagnosis not present

## 2023-12-03 NOTE — Progress Notes (Signed)
 Patient name: Linda Ray MRN: [id number] DOB: 21-Jan-1935 Sex: female  REASON FOR CONSULT: 1 year follow-up for surveillance PAU with supra-celiac aortic aneurysm  HPI: Linda Ray is a 89 y.o. female, with history of diabetes, hypertension, COPD that presents for 1 year follow-up of PAU with supra-celiac aortic aneurysm.  Reports no new abdominal or back pain.  Wants to continue surveillance of her aneurysm as it helps her determine decisions in her life.  States she is planning a trip to [place].  I previously referred her to [facility] for custom modified four-vessel fenestration to treat her supraceliac aorta.  I did get a note from Dr. Missy and ultimately she has elected continued observation and a four-vessel endograft would require iliac conduit and she did not want to take on the risks of cardiac and pulmonary complications.  We have also performed previous left brachial angioplasty.  Arm is doing great with no complaints.  Past Medical History:  Diagnosis Date   Anemia    Arthritis    Borderline diabetes    Breast cancer (HCC)    CHF (congestive heart failure) (HCC)    Chronic back pain    COPD (chronic obstructive pulmonary disease) (HCC)    Diabetes mellitus without complication (HCC)    Hypertension    RLS (restless legs syndrome)     Past Surgical History:  Procedure Laterality Date   ABDOMINAL SURGERY  1954   AORTIC ARCH ANGIOGRAPHY N/A 04/14/2020   Procedure: AORTIC ARCH ANGIOGRAPHY;  Surgeon: Gretta Lonni PARAS, MD;  Location: [facility];  Service: Cardiovascular;  Laterality: N/A;   BACK SURGERY  2009   BREAST SURGERY     COLONOSCOPY     [facility]   MASTECTOMY Left 2007   MASTECTOMY     TONSILLECTOMY      Family History  Problem Relation Age of Onset   Colon cancer Mother        age 17   Stroke Father    Cancer Sister        breast, bladder, kidney    SOCIAL HISTORY: Social History   Socioeconomic History   Marital status: Widowed    Spouse  name: Not on file   Number of children: 2   Years of education: Not on file   Highest education level: Not on file  Occupational History   Occupation: retired  Tobacco Use   Smoking status: Every Day    Current packs/day: 1.00    Average packs/day: 1 pack/day for 65.0 years (65.0 ttl pk-yrs)    Types: Cigarettes   Smokeless tobacco: Never   Tobacco comments:    currently smoking 1ppd 07/30/2016  Vaping Use   Vaping status: Never Used  Substance and Sexual Activity   Alcohol use: Yes    Comment: occasionally   Drug use: No   Sexual activity: Not on file  Other Topics Concern   Not on file  Social History Narrative   Current Smoker, smoking 21-30 Cigarettes Daily   Alcohol, Yes - rare         Social Drivers of Health   Financial Resource Strain: Low Risk  (09/18/2023)   Received from [facility]   Overall Financial Resource Strain (CARDIA)    How hard is it for you to pay for the very basics like food, housing, medical care, and heating?: Not hard at all  Food Insecurity: No Food Insecurity (09/18/2023)   Received from [facility]   Hunger Vital  Sign    Within the past 12 months, you worried that your food would run out before you got the money to buy more.: Never true    Within the past 12 months, the food you bought just didn't last and you didn't have money to get more.: Never true  Transportation Needs: No Transportation Needs (09/18/2023)   Received from [facility] - Transportation    In the past 12 months, has lack of transportation kept you from medical appointments or from getting medications?: No    In the past 12 months, has lack of transportation kept you from meetings, work, or from getting things needed for daily living?: No  Physical Activity: Inactive (09/18/2023)   Received from [facility]   Exercise Vital Sign    On average, how many days per week do you engage in moderate to strenuous exercise (like a brisk walk)?: 0 days    Minutes of  Exercise per Session: Not on file  Stress: No Stress Concern Present (09/18/2023)   Received from [facility] - Occupational Stress Questionnaire    Do you feel stress - tense, restless, nervous, or anxious, or unable to sleep at night because your mind is troubled all the time - these days?: Not at all  Social Connections: Socially Integrated (09/18/2023)   Received from [facility]   Social Network    How would you rate your social network (family, work, friends)?: Good participation with social networks  Intimate Partner Violence: Not At Risk (09/18/2023)   Received from [facility]   HITS    Over the last 12 months how often did your partner physically hurt you?: Never    Over the last 12 months how often did your partner insult you or talk down to you?: Never    Over the last 12 months how often did your partner threaten you with physical harm?: Never    Over the last 12 months how often did your partner scream or curse at you?: Never    Allergies  Allergen Reactions   Sulfa Drugs Cross Reactors Nausea And Vomiting   Tetanus-Diphth-Acell Pertussis Swelling    Significant local reaction   Codeine Nausea And Vomiting   Metformin And Related Diarrhea   Sulfa Antibiotics Itching    Current Outpatient Medications  Medication Sig Dispense Refill   albuterol  (PROVENTIL  HFA;VENTOLIN  HFA) 108 (90 Base) MCG/ACT inhaler Inhale 1-2 puffs into the lungs every 6 (six) hours as needed for wheezing or shortness of breath. 1 each 0   amLODipine (NORVASC) 2.5 MG tablet Take 5 mg by mouth every evening.     aspirin  81 MG tablet Take 81 mg by mouth daily.     atorvastatin  (LIPITOR) 10 MG tablet Take 1 tablet (10 mg total) by mouth daily. 30 tablet 11   B Complex-C (B-COMPLEX WITH VITAMIN C) tablet Take 1 tablet by mouth daily.     No current facility-administered medications for this visit.    REVIEW OF SYSTEMS:  [X]  denotes positive finding, [ ]   denotes negative finding Cardiac  Comments:  Chest pain or chest pressure:    Shortness of breath upon exertion:    Short of breath when lying flat:    Irregular heart rhythm:        Vascular    Pain in calf, thigh, or hip brought on by ambulation:    Pain in feet at night that wakes you up from your  sleep:     Blood clot in your veins:    Leg swelling:         Pulmonary    Oxygen at home:    Productive cough:     Wheezing:         Neurologic    Sudden weakness in arms or legs:     Sudden numbness in arms or legs:     Sudden onset of difficulty speaking or slurred speech:    Temporary loss of vision in one eye:     Problems with dizziness:         Gastrointestinal    Blood in stool:     Vomited blood:         Genitourinary    Burning when urinating:     Blood in urine:        Psychiatric    Major depression:         Hematologic    Bleeding problems:    Problems with blood clotting too easily:        Skin    Rashes or ulcers:        Constitutional    Fever or chills:      PHYSICAL EXAM: There were no vitals filed for this visit.   GENERAL: The patient is a well-nourished female, in no acute distress. The vital signs are documented above. CARDIAC: There is a regular rate and rhythm.  VASCULAR:  Left radial pulse palpable Bilateral femoral pulses palpable No palpable pedal pulses Abdomen: No pain with palpation  DATA:   CTA chest abdomen pelvis 11/27/2023 with similar appearance of thrombosed saccular aneurysm now measuring about 4 cm in maximal diameter  Assessment/Plan:  24 - year-old female presents for 1 year follow-up of PAU with supra-celiac aneurysm.  I reviewed her most recent CTA and there has been some slight increase now measuring about 4 cm in maximal diameter although harder to measure with thrombus.  She is not interested in repair and I previously sent her to Dr. Missy for evaluation of four-vessel fenestration.  She understands given the  location of her aneurysm would be difficult to treat with traditional endograft.  Will continue surveillance with another CTA in 1 year as she is using this information to determine decisions about how she lives her life.  Discussed I would not offer repair in emergent rupture.  Her left radial pulse remains palpable in the setting of prior brachial angioplasty.  We will follow this clinically and no left arm complaints at this time.  Lonni DOROTHA Gaskins, MD [facility] [place] Office: [phone]

## 2024-01-01 ENCOUNTER — Emergency Department (HOSPITAL_COMMUNITY)
Admission: EM | Admit: 2024-01-01 | Discharge: 2024-01-01 | Discharge status: Against Medical Advice | Attending: Emergency Medicine | Admitting: Emergency Medicine

## 2024-01-01 ENCOUNTER — Encounter (HOSPITAL_COMMUNITY): Payer: Self-pay

## 2024-01-01 ENCOUNTER — Other Ambulatory Visit: Payer: Self-pay

## 2024-01-01 DIAGNOSIS — Z79899 Other long term (current) drug therapy: Secondary | ICD-10-CM | POA: Diagnosis not present

## 2024-01-01 DIAGNOSIS — Z7982 Long term (current) use of aspirin: Secondary | ICD-10-CM | POA: Insufficient documentation

## 2024-01-01 DIAGNOSIS — J449 Chronic obstructive pulmonary disease, unspecified: Secondary | ICD-10-CM | POA: Insufficient documentation

## 2024-01-01 DIAGNOSIS — I10 Essential (primary) hypertension: Secondary | ICD-10-CM | POA: Insufficient documentation

## 2024-01-01 DIAGNOSIS — I708 Atherosclerosis of other arteries: Secondary | ICD-10-CM | POA: Diagnosis not present

## 2024-01-01 DIAGNOSIS — E119 Type 2 diabetes mellitus without complications: Secondary | ICD-10-CM | POA: Insufficient documentation

## 2024-01-01 DIAGNOSIS — R1033 Periumbilical pain: Secondary | ICD-10-CM | POA: Diagnosis present

## 2024-01-01 DIAGNOSIS — R109 Unspecified abdominal pain: Secondary | ICD-10-CM

## 2024-01-01 DIAGNOSIS — R9389 Abnormal findings on diagnostic imaging of other specified body structures: Secondary | ICD-10-CM | POA: Diagnosis not present

## 2024-01-01 DIAGNOSIS — K551 Chronic vascular disorders of intestine: Secondary | ICD-10-CM | POA: Insufficient documentation

## 2024-01-01 NOTE — Consult Note (Signed)
 Vascular and Vein Specialist of [facility]  Patient name: Linda Ray MRN: [id number] DOB: 1934/10/31 Sex: female   REQUESTING PROVIDER:    ER   REASON FOR CONSULT:    Abd pain  HISTORY OF PRESENT ILLNESS:   Linda Ray is a 89 y.o. female, who presented to the emergency department after a CT scan performed at [facility] today.  She reports approximately a 40 pound weight loss over the past several years.  She does not endorse postprandial abdominal pain.  In fact she says she has a voracious appetite.  She has had normal bowel function.  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  She had been referred to [facility] but has declined surgical treatment of her aneurysm.  PAST MEDICAL HISTORY    Past Medical History:  Diagnosis Date   AAA (abdominal aortic aneurysm)    Anemia    Arthritis    Borderline diabetes    Breast cancer (HCC)    CHF (congestive heart failure) (HCC)    Chronic back pain    COPD (chronic obstructive pulmonary disease) (HCC)    Diabetes mellitus without complication (HCC)    Hypertension    RLS (restless legs syndrome)      FAMILY HISTORY   Family History  Problem Relation Age of Onset   Colon cancer Mother        age 30   Stroke Father    Cancer Sister        breast, bladder, kidney    SOCIAL HISTORY:   Social History   Socioeconomic History   Marital status: Widowed    Spouse name: Not on file   Number of children: 2   Years of education: Not on file   Highest education level: Not on file  Occupational History   Occupation: retired  Tobacco Use   Smoking status: Every Day    Current packs/day: 1.00    Average packs/day: 1 pack/day for 65.0 years (65.0 ttl pk-yrs)    Types: Cigarettes   Smokeless tobacco: Never   Tobacco comments:    currently smoking 1ppd 07/30/2016  Vaping Use   Vaping status: Never Used  Substance and Sexual Activity   Alcohol use: Yes     Comment: occasionally   Drug use: No   Sexual activity: Not on file  Other Topics Concern   Not on file  Social History Narrative   Current Smoker, smoking 21-30 Cigarettes Daily   Alcohol, Yes - rare         Social Drivers of Health   Financial Resource Strain: Low Risk  (09/18/2023)   Received from [facility]   Overall Financial Resource Strain (CARDIA)    How hard is it for you to pay for the very basics like food, housing, medical care, and heating?: Not hard at all  Food Insecurity: No Food Insecurity (09/18/2023)   Received from [facility]   Hunger Vital Sign    Within the past 12 months, you worried that your food would run out before you got the money to buy more.: Never true    Within the past 12 months, the food you bought just didn't last and you didn't have money to get more.: Never true  Transportation Needs: No Transportation Needs (09/18/2023)   Received from [facility] - Transportation    In the past 12 months, has lack of transportation kept you from medical appointments or  from getting medications?: No    In the past 12 months, has lack of transportation kept you from meetings, work, or from getting things needed for daily living?: No  Physical Activity: Inactive (09/18/2023)   Received from [facility]   Exercise Vital Sign    On average, how many days per week do you engage in moderate to strenuous exercise (like a brisk walk)?: 0 days    Minutes of Exercise per Session: Not on file  Stress: No Stress Concern Present (09/18/2023)   Received from [facility] - Occupational Stress Questionnaire    Do you feel stress - tense, restless, nervous, or anxious, or unable to sleep at night because your mind is troubled all the time - these days?: Not at all  Social Connections: Socially Integrated (09/18/2023)   Received from [facility]   Social Network    How would you rate your social network (family,  work, friends)?: Good participation with social networks  Intimate Partner Violence: Not At Risk (09/18/2023)   Received from [facility]   HITS    Over the last 12 months how often did your partner physically hurt you?: Never    Over the last 12 months how often did your partner insult you or talk down to you?: Never    Over the last 12 months how often did your partner threaten you with physical harm?: Never    Over the last 12 months how often did your partner scream or curse at you?: Never    ALLERGIES:    Allergies  Allergen Reactions   Sulfa Drugs Cross Reactors Nausea And Vomiting   Tetanus-Diphth-Acell Pertussis Swelling    Significant local reaction   Codeine Nausea And Vomiting   Metformin And Related Diarrhea   Sulfa Antibiotics Itching    CURRENT MEDICATIONS:    No current facility-administered medications for this encounter.   Current Outpatient Medications  Medication Sig Dispense Refill   albuterol  (PROVENTIL  HFA;VENTOLIN  HFA) 108 (90 Base) MCG/ACT inhaler Inhale 1-2 puffs into the lungs every 6 (six) hours as needed for wheezing or shortness of breath. 1 each 0   aspirin  81 MG tablet Take 81 mg by mouth daily.     atorvastatin  (LIPITOR) 10 MG tablet Take 1 tablet (10 mg total) by mouth daily. 30 tablet 11   B Complex-C (B-COMPLEX WITH VITAMIN C) tablet Take 1 tablet by mouth daily.     olmesartan (BENICAR) 20 MG tablet Take 10 mg by mouth daily.     rOPINIRole (REQUIP) 0.5 MG tablet Take 0.5 mg by mouth daily.      REVIEW OF SYSTEMS:   [X]  denotes positive finding, [ ]  denotes negative finding Cardiac  Comments:  Chest pain or chest pressure:    Shortness of breath upon exertion:    Short of breath when lying flat:    Irregular heart rhythm:        Vascular    Pain in calf, thigh, or hip brought on by ambulation:    Pain in feet at night that wakes you up from your sleep:     Blood clot in your veins:    Leg swelling:         Pulmonary    Oxygen  at home:    Productive cough:     Wheezing:         Neurologic    Sudden weakness in arms or legs:     Sudden numbness  in arms or legs:     Sudden onset of difficulty speaking or slurred speech:    Temporary loss of vision in one eye:     Problems with dizziness:         Gastrointestinal    Blood in stool:      Vomited blood:         Genitourinary    Burning when urinating:     Blood in urine:        Psychiatric    Major depression:         Hematologic    Bleeding problems:    Problems with blood clotting too easily:        Skin    Rashes or ulcers:        Constitutional    Fever or chills:     PHYSICAL EXAM:   Vitals:   01/01/24 1913 01/01/24 1926 01/01/24 1945 01/01/24 2000  BP:   139/63 (!) 161/77  Pulse:   83 86  Resp:    16  Temp:      TempSrc:      SpO2: 100% 100% 100% 100%  Weight:      Height:        GENERAL: The patient is a well-nourished female, in no acute distress. The vital signs are documented above. CARDIAC: There is a regular rate and rhythm.  PULMONARY: Nonlabored respirations ABDOMEN: Soft and non-tender MUSCULOSKELETAL: There are no major deformities or cyanosis. NEUROLOGIC: No focal weakness or paresthesias are detected. SKIN: There are no ulcers or rashes noted. PSYCHIATRIC: The patient has a normal affect.  STUDIES:   I have reviewed the report from her CT scan today as follows: 1.  Persistent irregular nodular areas of consolidation in the right lower lobe as well as cavitary lesion in the right middle lobe. Differential remains neoplasm versus infection.   2.  Severe atherosclerotic disease of the thoracoabdominal aorta as described above. There is an area of eccentric anterior dilatation of the distal thoracic aorta, which may be related to a thrombosed penetrating ulcer/pseudoaneurysm or plaque.   3.  There is occlusion versus severe stenosis of the origin of the celiac. Severe diffuse atherosclerotic disease of the SMA.  Difficult to evaluate the origin of the SMA because of calcified plaque. IMA is perfused. This may be the cause of the patient's abdominal pain given the severe disease.   4.  No definite bowel wall thickening. No pneumatosis.   5. Heterogeneous liver may be related to technical factors and phase of enhancement. Follow-up ultrasound suggested..    I have reviewed the images from her CT scan performed at [facility] on 11/27/2023: 1. No thoracic aortic aneurysm. Coronary artery atherosclerotic vascular disease. Arch vessel vascular disease which is worst in the left subclavian artery. 2. Similar appearance of right upper lobe cavitary nodule measuring 1.5 cm. 3. 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 4. Suspected progression of disease within the SMA which is either severely stenotic or focally occluded. Severe disease is also present in the celiac artery.   ASSESSMENT and PLAN   Mesenteric stenosis: The patient is here with her daughter.  She had a CT scan today that does not appear to be any different from her scan a month ago however I could not view the images today as they were done at [facility].  She does have stenosis and/or occlusion in her celiac  and superior mesenteric artery however her IMA is patent.  I suspect she just has a severe stenosis of her SMA however this cannot be well-evaluated because of the severe calcific plaque.  Her abdominal pain does not appear to be secondary to chronic mesenteric ischemia as she does not endorse postprandial abdominal pain.  I have encouraged her to explore alternatives to her abdominal pain even possibly GI consultation.  She knows to monitor her symptoms and if she starts to develop postprandial abdominal pain she should reach out to our office otherwise we will plan on seeing her at her routine follow-up.  The patient did end up leaving the ER  AMA.   Malvina Serene CLORE, MD, FACS Vascular and Vein Specialists of [facility] [phone] Pager [phone]

## 2024-01-01 NOTE — ED Notes (Signed)
 Pt left prior to dc, called back by Rogelia MD/ Lauren PA and returned to [facility]. Lauren PA educated pt with RN as witness, pt and pt daughter verbalizes understanding and elects to leave AMA, AMA form signed. Pt A&Ox4 and ambulatory to lobby to return home with daughter.

## 2024-01-01 NOTE — ED Provider Notes (Signed)
 [place] [facility] Provider Note   CSN: [id number] Arrival date & time: 01/01/24  8167     Patient presents with: Abdominal Pain   Linda Ray is a 89 y.o. female with a past medical history of HTN, diabetes, COPD presents to emergency department for evaluation of suspected of celiac artery found on CTA found on CT imaging today. Complains of abdominal pain for a long time but when asked to quantify  length of time she reports pain over the past 3 weeks. Pain is primarily located in horizontal band like distribution around front of abdomen in periumbillical region.  Last BM was yesterday and normal.  Is currently passing gas.  Denies NVD, fevers     Abdominal Pain      Prior to Admission medications   Medication Sig Start Date End Date Taking? Authorizing Provider  albuterol  (PROVENTIL  HFA;VENTOLIN  HFA) 108 (90 Base) MCG/ACT inhaler Inhale 1-2 puffs into the lungs every 6 (six) hours as needed for wheezing or shortness of breath. 06/12/18   [place], Camellia PARAS, DO  aspirin  81 MG tablet Take 81 mg by mouth daily.    [provider]  atorvastatin  (LIPITOR) 10 MG tablet Take 1 tablet (10 mg total) by mouth daily. 04/14/20 12/03/23  Gretta Lonni PARAS, MD  B Complex-C (B-COMPLEX WITH VITAMIN C) tablet Take 1 tablet by mouth daily.    [provider]  olmesartan (BENICAR) 20 MG tablet Take 10 mg by mouth daily.    [provider]  rOPINIRole (REQUIP) 0.5 MG tablet Take 0.5 mg by mouth daily. 11/20/23   [provider]    Allergies: Sulfa drugs cross reactors, Tetanus-diphth-acell pertussis, Codeine, Metformin and related, and Sulfa antibiotics    Review of Systems  Gastrointestinal:  Positive for abdominal pain.    Updated Vital Signs BP (!) 161/77 (BP Location: Right Arm)   Pulse 86   Temp 97.7 F (36.5 C) (Oral)   Resp 16   Ht 5' (1.524 m)   Wt 40.8 kg   SpO2 100%   BMI 17.58 kg/m   Physical  Exam Vitals and nursing note reviewed.  Constitutional:      General: She is not in acute distress.    Appearance: Normal appearance.  HENT:     Head: Normocephalic and atraumatic.  Eyes:     Conjunctiva/sclera: Conjunctivae normal.  Cardiovascular:     Rate and Rhythm: Normal rate.  Pulmonary:     Effort: Pulmonary effort is normal. No respiratory distress.  Abdominal:     Tenderness: There is generalized abdominal tenderness.     Comments: Nonsurgical abdomen with no peritoneal signs  Skin:    Coloration: Skin is not jaundiced or pale.  Neurological:     Mental Status: She is alert. Mental status is at baseline.     (all labs ordered are listed, but only abnormal results are displayed) Labs Reviewed - No data to display  EKG: None  Radiology: No results found.   Medications Ordered in the ED - No data to display                                  Medical Decision Making Amount and/or Complexity of Data Reviewed Labs: ordered.     Patient presents to the ED for concern of abdominal pain, abnormal CT, this involves an extensive number of treatment options, and is a complaint that  carries with it a high risk of complications and morbidity.  The differential diagnosis includes new acute occlusion, mesenteric ischemia, ischemic gut, appendicitis, pancreatitis, diverticulitis, obstruction, perforation, ileus.  Definitely not an exhaustive list   Co morbidities that complicate the patient evaluation  Multiple.  Linda HPI   Additional history obtained:  Additional history obtained from Nursing and Outside Medical Records   External records from outside source obtained and reviewed including triage note, [facility] CT impression from today, vascular surgery note from 12/03/2023      Consultations Obtained:  I requested consultation with vascular surgery,  and discussed lab and imaging findings as well as pertinent plan -  No acute changes in CT from previous on  11/2023 Nonacute abdomen with no peritoneal signs. Does not think emergent surgery is necessary at this time Can f/u outpatient with her vascular surgeon who is well aware of her condition   Problem List / ED Course:  Abd pain Atherosclerosis of SMA and celiac artery  Per vascular surgery who reviewed CT imaging from 11/2023 and impression of [facility] CT imaging from today, does not appear to be acutely changed.  Pain is well-controlled and she does not appear in much pain.  No peritoneal signs.  Does not recommend acute emergent surgery.  Can follow-up outpatient with vascular surgery No acute abnormalities to abdomen to explain acute pain  Abnormal CT scan of chest Incidental finding of new cavitating lesions in the lungs that was not present on scan on 11/2023 I discussed that we should obtain lab work, blood cultures, a formal dedicated CT scan of chest with contrast to further characterize lesions and to Linda if these are different from our scan in October.  Patient refuses.  I discussed importance of this and how she may need IV antibiotics, admission if these are cavitating pneumonia or new lesions.  She expresses understanding and does not wish to proceed with lab work or CT scan of her chest.  She states I just want to go home and my PCP can manage this.  I did discuss that if her PCP does obtain a CT of her chest, she may return back to Emergency Department.  She expressed understanding and still wishes to refuse. She is maintaining oxygen saturation with no supplementation.  No physical signs of respiratory distress.  No tachypnea. Overall is well-appearing considering.  No fever no tachycardia.  I did offer pain medicine however she reports the pain is not that bad.  She also refuses this patient is to go home with her daughter   Reevaluation:  After the interventions noted above, I reevaluated the patient and found that they have :stayed the same     Dispostion:  Date:  01/02/2024 Patient: Linda Ray Admitted: 01/01/2024  6:32 PM Attending Provider: No att. providers found  Linda Ray or her authorized caregiver has made the decision for the patient to leave the emergency department against the advice of Linda Ray.  She or her authorized caregiver has been informed and understands the inherent risks, including death.  She or her authorized caregiver has decided to accept the responsibility for this decision. Linda Ray and all necessary parties have been advised that she may return for further evaluation or treatment. Her condition at time of discharge was Stable.  Linda Ray had current vital signs as follows:  Blood pressure (!) 161/77, pulse 86, temperature 97.7 F (36.5 C), temperature source Oral, resp. rate 16, height 5' (1.524 m), weight 40.8  kg, SpO2 100%.   Linda Ray or her authorized caregiver has signed the Leaving Against Medical Advice form prior to leaving the department.  Tinnie FORBES Matter 01/02/2024  Final diagnoses:  Abdominal pain, unspecified abdominal location  Atherosclerosis of superior mesenteric artery  Atherosclerosis of celiac artery  Abnormal CT scan, chest    ED Discharge Orders     None        Matter Tinnie FORBES, PA 01/02/24 [id number]    Rogelia Jerilynn GORMAN, MD 01/08/24 [id number]

## 2024-01-01 NOTE — Telephone Encounter (Signed)
 Called patient's daughter Reena and spoke with her and the patient. Made them aware of the patient's CT report. They are going to head to [facility] ED. I will call Jolynn Pack ED Charge nurse and make them aware.

## 2024-01-01 NOTE — ED Triage Notes (Signed)
 C/O abd pain for 3 weeks. Ct showed vessel occulusion in abd. Axox4. Denies CP/SHOB.

## 2024-01-01 NOTE — Discharge Instructions (Addendum)
 Thank you for letting us  evaluate you today.  Please follow-up with vascular surgery regarding continued management of plaque in arteries. Follow up with PCP regarding incidental finding of cavitating lesion found on CT  Return to Emergency Department if you experience chest pain, shortness of breath, significant worsening abdominal pain, intractable vomiting causing to be unable to keep fluids down, worsening symptoms

## 2024-01-02 NOTE — Progress Notes (Signed)
 Assessment and Plan   1. Type 2 diabetes mellitus with hyperglycemia, without long-term current use of insulin  (*) (Primary) 2. Chronic kidney disease, stage 3a (GFR 45-59) 3. Pulmonary nodules    Long discussion with patient and grand daughter in law Mallie about lung nodules possibly being cancer   She is not interested in seeing pulmonary or having oncology work up      She is established with [facility] for arthrosclerotic vascular disease.  She was seen by vascular in the ER last night   Plan is to follow up  at regular annual appt and then if abdominal pain is constant she will get a stent   Mesenteric stenosis: The patient is here with her daughter. She had a CT scan today that does not appear to be any different from her scan a month ago however I could not view the images today as they were done at [facility]. She does have stenosis and/or occlusion in her celiac and superior mesenteric artery however her IMA is patent. I suspect she just has a severe stenosis of her SMA however this cannot be well-evaluated because of the severe calcific plaque. Her abdominal pain does not appear to be secondary to chronic mesenteric ischemia as she does not endorse postprandial abdominal pain. I have encouraged her to explore alternatives to her abdominal pain even possibly GI consultation. She knows to monitor her symptoms and if she starts to develop postprandial abdominal pain she should reach out to our office otherwise we will plan on seeing her at her routine follow-up. The patient did end up leaving the ER AMA.  Linda Serene CLORE, MD, FACS [facility] [phone] Pager [phone]   Risks, benefits, and alternatives of the medications and treatment plan prescribed today were discussed, and patient expressed understanding. Plan follow-up as discussed or as needed if any worsening symptoms or change in condition.      Subjective   Linda  Uliana Ray is a 89 y.o. (DOB [birth year]) female who presents for the following:    Patient presents with  . Hospital Discharge    ER recheck talk about CT scan     HPI  Hospital follow up  Outside records, labs, and imaging reviewed and d/w patient.    [facility]  Patient name: Linda Ray MRN: [id number] DOB: Oct 13, 1934 Sex: female  REQUESTING PROVIDER:  ER  REASON FOR CONSULT:  Abd pain  HISTORY OF PRESENT ILLNESS:  Linda Ray is a 89 y.o. female, who presented to the emergency department after a CT [facility] today. She reports approximately a 40 pound weight loss over the past several years. She does not endorse postprandial abdominal pain. In fact she says she has a voracious appetite. She has had normal bowel function. 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 She had been referred to [facility] but has declined surgical treatment of her aneurysm.  PAST MEDICAL HISTORY  Past Medical History: Diagnosis Date AAA (abdominal aortic aneurysm) Anemia Arthritis Borderline diabetes Breast cancer (HCC) CHF (congestive heart failure) (HCC) Chronic back pain COPD (chronic obstructive pulmonary disease) (HCC) Diabetes mellitus without complication (HCC) Hypertension RLS (restless legs syndrome)  FAMILY HISTORY  Family History Problem Relation Age of Onset Colon cancer Mother age 67 Stroke Father Cancer Sister breast, bladder, kidney  SOCIAL HISTORY:  Social History  Socioeconomic History Marital status: Widowed  Spouse name: Not on file Number of children: 2 Years of education: Not on file Highest education level: Not on file Occupational History Occupation: retired Tobacco Use Smoking status: Every Day Current packs/day: 1.00 Average packs/day: 1 pack/day for 65.0 years (65.0 ttl pk-yrs) Types: Cigarettes Smokeless tobacco: Never Tobacco comments: currently smoking 1ppd  07/30/2016 Vaping Use Vaping status: Never Used Substance and Sexual Activity Alcohol use: Yes Comment: occasionally Drug use: No Sexual activity: Not on file Other Topics Concern Not on file Social History Narrative Current Smoker, smoking 21-30 Cigarettes Daily Alcohol, Yes - rare    Social Drivers of Health  Financial Resource Strain: Low Risk (09/18/2023) Received from [facility] Overall Financial Resource Strain (CARDIA) How hard is it for you to pay for the very basics like food, housing, medical care, and heating?: Not hard at all Food Insecurity: No Food Insecurity (09/18/2023) Received from [facility] Hunger Vital Sign Within the past 12 months, you worried that your food would run out before you got the money to buy more.: Never true Within the past 12 months, the food you bought just didn't last and you didn't have money to get more.: Never true Transportation Needs: No Transportation Needs (09/18/2023) Received from [facility] - Transportation In the past 12 months, has lack of transportation kept you from medical appointments or from getting medications?: No In the past 12 months, has lack of transportation kept you from meetings, work, or from getting things needed for daily living?: No Physical Activity: Inactive (09/18/2023) Received from [facility] Exercise Vital Sign On average, how many days per week do you engage in moderate to strenuous exercise (like a brisk walk)?: 0 days Minutes of Exercise per Session: Not on file Stress: No Stress Concern Present (09/18/2023) Received from [facility] - Occupational Stress Questionnaire Do you feel stress - tense, restless, nervous, or anxious, or unable to sleep at night because your mind is troubled all the time - these days?: Not at all Social Connections: Socially Integrated (09/18/2023) Received from [facility] Social Network How would you rate your  social network (family, work, friends)?: Good participation with social networks Intimate Partner Violence: Not At Risk (09/18/2023) Received from [facility] HITS Over the last 12 months how often did your partner physically hurt you?: Never Over the last 12 months how often did your partner insult you or talk down to you?: Never Over the last 12 months how often did your partner threaten you with physical harm?: Never Over the last 12 months how often did your partner scream or curse at you?: Never  ALLERGIES:  Allergies Allergen Reactions Sulfa Drugs Cross Reactors Nausea And Vomiting Tetanus-Diphth-Acell Pertussis Swelling Significant local reaction Codeine Nausea And Vomiting Metformin And Related Diarrhea Sulfa Antibiotics Itching  CURRENT MEDICATIONS:  No current facility-administered medications for this encounter.  Current Outpatient Medications Medication Sig Dispense Refill albuterol  (PROVENTIL  HFA;VENTOLIN  HFA) 108 (90 Base) MCG/ACT inhaler Inhale 1-2 puffs into the lungs every 6 (six) hours as needed for wheezing or shortness of breath. 1 each 0 aspirin  81 MG tablet Take 81 mg by mouth daily. atorvastatin  (LIPITOR) 10 MG tablet Take 1 tablet (10 mg total) by mouth daily. 30 tablet 11 B Complex-C (B-COMPLEX WITH VITAMIN C) tablet Take 1 tablet by mouth daily. olmesartan (BENICAR) 20 MG tablet Take 10 mg by mouth daily. rOPINIRole (REQUIP) 0.5 MG tablet Take 0.5 mg by mouth daily.  REVIEW OF SYSTEMS:  [X]  denotes positive finding, [ ]   denotes negative finding Cardiac Comments: Chest pain or chest pressure: Shortness of breath upon exertion: Short of breath when lying flat: Irregular heart rhythm:  Vascular Pain in calf, thigh, or hip brought on by ambulation: Pain in feet at night that wakes you up from your sleep: Blood clot in your veins: Leg swelling:  Pulmonary Oxygen at home: Productive cough: Wheezing:  Neurologic Sudden weakness in arms or  legs: Sudden numbness in arms or legs: Sudden onset of difficulty speaking or slurred speech: Temporary loss of vision in one eye: Problems with dizziness:  Gastrointestinal Blood in stool:  Vomited blood:  Genitourinary Burning when urinating: Blood in urine:  Psychiatric Major depression:  Hematologic Bleeding problems: Problems with blood clotting too easily:  Skin Rashes or ulcers:  Constitutional Fever or chills:  PHYSICAL EXAM:  Vitals: 01/01/24 1913 01/01/24 1926 01/01/24 1945 01/01/24 2000 BP: 139/63 (!) 161/77 Pulse: 83 86 Resp: 16 Temp: TempSrc: SpO2: 100% 100% 100% 100% Weight: Height:  GENERAL: The patient is a well-nourished female, in no acute distress. The vital signs are documented above. CARDIAC: There is a regular rate and rhythm. PULMONARY: Nonlabored respirations ABDOMEN: Soft and non-tender MUSCULOSKELETAL: There are no major deformities or cyanosis. NEUROLOGIC: No focal weakness or paresthesias are detected. SKIN: There are no ulcers or rashes noted. PSYCHIATRIC: The patient has a normal affect.  STUDIES:  I have reviewed the report from her CT scan today as follows: 1. Persistent irregular nodular areas of consolidation in the right lower lobe as well as cavitary lesion in the right middle lobe. Differential remains neoplasm versus infection.  2. Severe atherosclerotic disease of the thoracoabdominal aorta as described above. There is an area of eccentric anterior dilatation of the distal thoracic aorta, which may be related to a thrombosed penetrating ulcer/pseudoaneurysm or plaque.  3. There is occlusion versus severe stenosis of the origin of the celiac. Severe diffuse atherosclerotic disease of the SMA. Difficult to evaluate the origin of the SMA because of calcified plaque. IMA is perfused. This may be the cause of the patient's abdominal pain given the severe disease.  4. No definite bowel wall thickening. No pneumatosis.  5.  Heterogeneous liver may be related to technical factors and phase of enhancement. Follow-up ultrasound suggested..  I have reviewed the images from her CT scan performed at [facility] on 11/27/2023: 1. No thoracic aortic aneurysm. Coronary artery atherosclerotic vascular disease. Arch vessel vascular disease which is worst in the left subclavian artery. 2. Similar appearance of right upper lobe cavitary nodule measuring 1.5 cm. 3. 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 4. Suspected progression of disease within the SMA which is either severely stenotic or focally occluded. Severe disease is also present in the celiac artery.  ASSESSMENT and PLAN  Mesenteric stenosis: The patient is here with her daughter. She had a CT scan today that does not appear to be any different from her scan a month ago however I could not view the images today as they were done at [facility]. She does have stenosis and/or occlusion in her celiac and superior mesenteric artery however her IMA is patent. I suspect she just has a severe stenosis of her SMA however this cannot be well-evaluated because of the severe calcific plaque. Her abdominal pain does not appear to be secondary to chronic mesenteric ischemia as she does not endorse postprandial abdominal pain. I have encouraged her to explore alternatives to  her abdominal pain even possibly GI consultation. She knows to monitor her symptoms and if she starts to develop postprandial abdominal pain she should reach out to our office otherwise we will plan on seeing her at her routine follow-up. The patient did end up leaving the ER AMA.  Linda Serene CLORE, MD, FACS [facility] [phone] Pager [phone] Electronically signed by Serene Gaile ORN, MD at 01/01/2024 10:15 PM EST   Reviewed and updated this visit by  provider: Tobacco  Allergies  Meds  Problems  Med Hx  Surg Hx  Fam Hx      Review of Systems  Respiratory:  Negative for shortness of breath and wheezing.   Cardiovascular:  Negative for chest pain, palpitations and leg swelling.  Genitourinary:  Negative for difficulty urinating and urgency.  Musculoskeletal:  Negative for gait problem.  Neurological:  Negative for speech difficulty and light-headedness.  Hematological:  Does not bruise/bleed easily.          Objective   Vitals:   01/02/24 1357  BP: 112/62  Patient Position: Sitting  Pulse: 92  Temp: 97.8 F (36.6 C)  TempSrc: Oral  Resp: 24  Height: 5' (1.524 m)  Weight: 89 lb 12.8 oz (40.7 kg)  SpO2: 95%  BMI (Calculated): 17.5  PainSc: 0-No pain    Physical Exam Vitals and nursing note reviewed.  Constitutional:      General: She is not in acute distress.    Appearance: Normal appearance. She is well-developed. She is not ill-appearing, toxic-appearing or diaphoretic.  HENT:     Head: Normocephalic and atraumatic.     Right Ear: External ear normal.     Left Ear: External ear normal.     Nose: Nose normal.  Eyes:     Pupils: Pupils are equal, round, and reactive to light.  Neck:     Thyroid : No thyromegaly.     Vascular: No carotid bruit.     Trachea: No tracheal deviation.  Cardiovascular:     Rate and Rhythm: Normal rate and regular rhythm.     Heart sounds: Normal heart sounds. No murmur heard.    No friction rub. No gallop.  Musculoskeletal:     Cervical back: Normal range of motion and neck supple.  Pulmonary:     Effort: Pulmonary effort is normal. No respiratory distress.     Breath sounds: Normal breath sounds. No wheezing.  Abdominal:     General: Bowel sounds are normal. There is no distension.     Palpations: Abdomen is soft. There is no mass.     Tenderness: There is no abdominal tenderness. There is no guarding or rebound.     Hernia: No hernia is present.  Lymphadenopathy:      Cervical: No cervical adenopathy.  Skin:    General: Skin is warm and dry.     Coloration: Skin is not pale.     Findings: No erythema or rash.  Neurological:     Mental Status: She is alert and oriented to person, place, and time.     Gait: Gait normal.     Deep Tendon Reflexes: Reflexes are normal and symmetric.  Psychiatric:        Mood and Affect: Mood normal.        Behavior: Behavior normal.        Thought Content: Thought content normal.        Judgment: Judgment normal.

## 2024-01-07 ENCOUNTER — Ambulatory Visit: Attending: Vascular Surgery | Admitting: Vascular Surgery

## 2024-01-07 ENCOUNTER — Other Ambulatory Visit: Payer: Self-pay

## 2024-01-07 ENCOUNTER — Encounter: Payer: Self-pay | Admitting: Vascular Surgery

## 2024-01-07 VITALS — BP 130/73 | HR 90 | Temp 98.5°F | Resp 22 | Ht 60.0 in | Wt 90.9 lb

## 2024-01-07 DIAGNOSIS — K551 Chronic vascular disorders of intestine: Secondary | ICD-10-CM | POA: Insufficient documentation

## 2024-01-07 NOTE — Progress Notes (Signed)
 Patient name: Linda Ray MRN: [id number] DOB: 1934-07-11 Sex: female  REASON FOR CONSULT: Triage, abdominal pain   HPI: Linda Ray is a 89 y.o. female, with history of diabetes, hypertension, COPD that presents for triage visit for abdominal pain.  Patient states she has had increasing abdominal pain over the last month.  States she has lost about 40 pounds over the last several years and is down to 90 pounds in total weight.  States she cannot figure out an etiology for the pain.  At times it does come after eating and her daughter states it happened on Friday night after eating and other times she has no pain after eating.  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  Followed for PAU of supra-celiac aortic aneurysm.  I previously referred her to [facility] for custom modified four-vessel fenestration to treat her supraceliac aorta.  I did get a note from Dr. Missy and ultimately she has elected continued observation and a four-vessel endograft would require iliac conduit and she did not want to take on the risks of cardiac and pulmonary complications.  We have also performed previous left brachial angioplasty.    Past Medical History:  Diagnosis Date   AAA (abdominal aortic aneurysm)    Anemia    Arthritis    Borderline diabetes    Breast cancer (HCC)    CHF (congestive heart failure) (HCC)    Chronic back pain    COPD (chronic obstructive pulmonary disease) (HCC)    Diabetes mellitus without complication (HCC)    Hypertension    RLS (restless legs syndrome)     Past Surgical History:  Procedure Laterality Date   ABDOMINAL SURGERY  1954   AORTIC ARCH ANGIOGRAPHY N/A 04/14/2020   Procedure: AORTIC ARCH ANGIOGRAPHY;  Surgeon: Gretta Linda PARAS, MD;  Location: [facility] INVASIVE CV LAB;  Service: Cardiovascular;  Laterality: N/A;   BACK SURGERY  2009   BREAST SURGERY     COLONOSCOPY     [facility]    MASTECTOMY Left 2007   MASTECTOMY     TONSILLECTOMY      Family History  Problem Relation Age of Onset   Colon cancer Mother        age 63   Stroke Father    Cancer Sister        breast, bladder, kidney    SOCIAL HISTORY: Social History   Socioeconomic History   Marital status: Widowed    Spouse name: Not on file   Number of children: 2   Years of education: Not on file   Highest education level: Not on file  Occupational History   Occupation: retired  Tobacco Use   Smoking status: Every Day    Current packs/day: 1.00    Average packs/day: 1 pack/day for 65.0 years (65.0 ttl pk-yrs)    Types: Cigarettes   Smokeless tobacco: Never   Tobacco comments:    currently smoking 1ppd 07/30/2016  Vaping Use   Vaping status: Never Used  Substance and Sexual Activity   Alcohol use: Yes    Comment: occasionally   Drug use: No   Sexual activity: Not on file  Other Topics Concern   Not on file  Social History Narrative   Current Smoker, smoking 21-30 Cigarettes Daily   Alcohol, Yes - rare         Social Drivers of Health  Financial Resource Strain: Low Risk  (09/18/2023)   Received from [facility]   Overall Financial Resource Strain (CARDIA)    How hard is it for you to pay for the very basics like food, housing, medical care, and heating?: Not hard at all  Food Insecurity: No Food Insecurity (09/18/2023)   Received from [facility]   Hunger Vital Sign    Within the past 12 months, you worried that your food would run out before you got the money to buy more.: Never true    Within the past 12 months, the food you bought just didn't last and you didn't have money to get more.: Never true  Transportation Needs: No Transportation Needs (09/18/2023)   Received from [facility] - Transportation    In the past 12 months, has lack of transportation kept you from medical appointments or from getting medications?: No    In the past 12 months, has lack of  transportation kept you from meetings, work, or from getting things needed for daily living?: No  Physical Activity: Inactive (09/18/2023)   Received from [facility]   Exercise Vital Sign    On average, how many days per week do you engage in moderate to strenuous exercise (like a brisk walk)?: 0 days    Minutes of Exercise per Session: Not on file  Stress: No Stress Concern Present (09/18/2023)   Received from [facility] of Occupational Health - Occupational Stress Questionnaire    Do you feel stress - tense, restless, nervous, or anxious, or unable to sleep at night because your mind is troubled all the time - these days?: Not at all  Social Connections: Socially Integrated (09/18/2023)   Received from [facility]   Social Network    How would you rate your social network (family, work, friends)?: Good participation with social networks  Intimate Partner Violence: Not At Risk (09/18/2023)   Received from [facility]   HITS    Over the last 12 months how often did your partner physically hurt you?: Never    Over the last 12 months how often did your partner insult you or talk down to you?: Never    Over the last 12 months how often did your partner threaten you with physical harm?: Never    Over the last 12 months how often did your partner scream or curse at you?: Never    Allergies  Allergen Reactions   Sulfa Drugs Cross Reactors Nausea And Vomiting   Tetanus-Diphth-Acell Pertussis Swelling    Significant local reaction   Codeine Nausea And Vomiting   Metformin And Related Diarrhea   Sulfa Antibiotics Itching    Current Outpatient Medications  Medication Sig Dispense Refill   albuterol  (PROVENTIL  HFA;VENTOLIN  HFA) 108 (90 Base) MCG/ACT inhaler Inhale 1-2 puffs into the lungs every 6 (six) hours as needed for wheezing or shortness of breath. 1 each 0   aspirin  81 MG tablet Take 81 mg by mouth daily.     atorvastatin  (LIPITOR) 10 MG tablet Take 1 tablet  (10 mg total) by mouth daily. 30 tablet 11   B Complex-C (B-COMPLEX WITH VITAMIN C) tablet Take 1 tablet by mouth daily.     olmesartan (BENICAR) 20 MG tablet Take 10 mg by mouth daily.     rOPINIRole (REQUIP) 0.5 MG tablet Take 0.5 mg by mouth daily.     No current facility-administered medications for this visit.    REVIEW OF SYSTEMS:  [  X] denotes positive finding, [ ]  denotes negative finding Cardiac  Comments:  Chest pain or chest pressure:    Shortness of breath upon exertion:    Short of breath when lying flat:    Irregular heart rhythm:        Vascular    Pain in calf, thigh, or hip brought on by ambulation:    Pain in feet at night that wakes you up from your sleep:     Blood clot in your veins:    Leg swelling:         Pulmonary    Oxygen at home:    Productive cough:     Wheezing:         Neurologic    Sudden weakness in arms or legs:     Sudden numbness in arms or legs:     Sudden onset of difficulty speaking or slurred speech:    Temporary loss of vision in one eye:     Problems with dizziness:         Gastrointestinal    Blood in stool:     Vomited blood:         Genitourinary    Burning when urinating:     Blood in urine:        Psychiatric    Major depression:         Hematologic    Bleeding problems:    Problems with blood clotting too easily:        Skin    Rashes or ulcers:        Constitutional    Fever or chills:      PHYSICAL EXAM: There were no vitals filed for this visit.   GENERAL: The patient is a well-nourished female, in no acute distress. The vital signs are documented above. CARDIAC: There is a regular rate and rhythm.  VASCULAR:  Left radial pulse palpable Bilateral femoral pulses palpable No palpable pedal pulses Abdomen: No pain with palpation  DATA:   CTA C/A/P 11/27/23:  IMPRESSION: 1. No thoracic aortic aneurysm. Coronary artery atherosclerotic vascular disease. Arch vessel vascular disease which is worst in  the left subclavian artery. 2. Similar appearance of right upper lobe cavitary nodule measuring 1.5 cm. 3. 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 4. Suspected progression of disease within the SMA which is either severely stenotic or focally occluded. Severe disease is also present in the celiac artery.     Electronically Signed   By: Maude Naegeli M.D.   On: 11/27/2023 10:33  CTA chest abdomen pelvis 11/27/2023 with similar appearance of thrombosed saccular aneurysm now measuring about 4 cm in maximal diameter  Assessment/Plan:  89 y.o. female, with history of diabetes, hypertension, COPD that presents for triage visit for abdominal pain.  Patient states she has had increasing abdominal pain over the last month.  States she has lost about 40 pounds over the last several years and is down to 90 pounds in total weight.  States she cannot figure out an etiology for the pain.  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  She does have a referral to GI which is still pending.  Discussed that some of her symptoms are consistent with chronic mesenteric ischemia given weight loss and intermittent pain after eating although at other times  she does not have postprandial pain which is unusual.  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  I have offered her the option of mesenteric angiogram with possible mesenteric intervention particularly if she has ongoing weight loss with some postprandial pain.  Will get scheduled in the Cath Lab.  Discussed moderate sedation.  Discussed transfemoral access.  Discussed risk of vessel injury with mesenteric stenting including risk of rupture and embolization leading to bowel ischemia.  Linda DOROTHA Gaskins, MD [facility] [place] Office: [phone]

## 2024-01-16 ENCOUNTER — Other Ambulatory Visit: Payer: Self-pay

## 2024-01-16 ENCOUNTER — Ambulatory Visit (HOSPITAL_COMMUNITY)
Admission: RE | Admit: 2024-01-16 | Discharge: 2024-01-16 | Disposition: A | Discharge status: Home / Self Care | Attending: Vascular Surgery | Admitting: Vascular Surgery

## 2024-01-16 ENCOUNTER — Encounter (HOSPITAL_COMMUNITY): Admission: RE | Disposition: A | Payer: Self-pay | Source: Home / Self Care | Attending: Vascular Surgery

## 2024-01-16 DIAGNOSIS — K551 Chronic vascular disorders of intestine: Secondary | ICD-10-CM | POA: Diagnosis present

## 2024-01-16 DIAGNOSIS — Z7982 Long term (current) use of aspirin: Secondary | ICD-10-CM | POA: Insufficient documentation

## 2024-01-16 DIAGNOSIS — E119 Type 2 diabetes mellitus without complications: Secondary | ICD-10-CM | POA: Insufficient documentation

## 2024-01-16 DIAGNOSIS — I110 Hypertensive heart disease with heart failure: Secondary | ICD-10-CM | POA: Diagnosis not present

## 2024-01-16 DIAGNOSIS — F17210 Nicotine dependence, cigarettes, uncomplicated: Secondary | ICD-10-CM | POA: Diagnosis not present

## 2024-01-16 DIAGNOSIS — J449 Chronic obstructive pulmonary disease, unspecified: Secondary | ICD-10-CM | POA: Diagnosis not present

## 2024-01-16 DIAGNOSIS — I708 Atherosclerosis of other arteries: Secondary | ICD-10-CM

## 2024-01-16 DIAGNOSIS — Z7902 Long term (current) use of antithrombotics/antiplatelets: Secondary | ICD-10-CM | POA: Insufficient documentation

## 2024-01-16 DIAGNOSIS — Z79899 Other long term (current) drug therapy: Secondary | ICD-10-CM | POA: Insufficient documentation

## 2024-01-16 DIAGNOSIS — I509 Heart failure, unspecified: Secondary | ICD-10-CM | POA: Diagnosis not present

## 2024-01-16 HISTORY — PX: VISCERAL ANGIOGRAPHY: CATH118276

## 2024-01-16 HISTORY — PX: VISCERAL ARTERY INTERVENTION: CATH118277

## 2024-01-16 HISTORY — PX: ABDOMINAL AORTOGRAM W/LOWER EXTREMITY: CATH118223

## 2024-01-16 LAB — POCT I-STAT, CHEM 8
BUN: 31 mg/dL — ABNORMAL HIGH (ref 8–23)
Calcium, Ion: 1.29 mmol/L (ref 1.15–1.40)
Chloride: 100 mmol/L (ref 98–111)
Creatinine, Ser: 1.2 mg/dL — ABNORMAL HIGH (ref 0.44–1.00)
Glucose, Bld: 154 mg/dL — ABNORMAL HIGH (ref 70–99)
HCT: 36 % (ref 36.0–46.0)
Hemoglobin: 12.2 g/dL (ref 12.0–15.0)
Potassium: 4.4 mmol/L (ref 3.5–5.1)
Sodium: 135 mmol/L (ref 135–145)
TCO2: 24 mmol/L (ref 22–32)

## 2024-01-16 LAB — POCT ACTIVATED CLOTTING TIME: Activated Clotting Time: 176 s

## 2024-01-16 SURGERY — ABDOMINAL AORTOGRAM W/LOWER EXTREMITY
Anesthesia: LOCAL

## 2024-01-16 MED ORDER — LABETALOL HCL 5 MG/ML IV SOLN
10.0000 mg | INTRAVENOUS | Status: DC | PRN
  Administered 2024-01-16 (×2): 10 mg via INTRAVENOUS

## 2024-01-16 MED ORDER — MIDAZOLAM HCL 2 MG/2ML IJ SOLN
INTRAMUSCULAR | Status: AC
  Filled 2024-01-16: qty 2

## 2024-01-16 MED ORDER — SODIUM CHLORIDE 0.9 % IV SOLN: 250.0000 mL | INTRAVENOUS | Status: DC | PRN

## 2024-01-16 MED ORDER — LABETALOL HCL 5 MG/ML IV SOLN
INTRAVENOUS | Status: AC
  Filled 2024-01-16: qty 4

## 2024-01-16 MED ORDER — CLOPIDOGREL BISULFATE 75 MG PO TABS: 75.0000 mg | ORAL_TABLET | Freq: Every day | ORAL | 11 refills | Status: AC

## 2024-01-16 MED ORDER — SODIUM CHLORIDE 0.9% FLUSH: 3.0000 mL | INTRAVENOUS | Status: DC | PRN

## 2024-01-16 MED ORDER — SODIUM CHLORIDE 0.9% FLUSH: 3.0000 mL | Freq: Two times a day (BID) | INTRAVENOUS | Status: DC

## 2024-01-16 MED ORDER — FENTANYL CITRATE (PF) 100 MCG/2ML IJ SOLN
INTRAMUSCULAR | Status: DC | PRN
  Administered 2024-01-16 (×3): 25 ug via INTRAVENOUS

## 2024-01-16 MED ORDER — MIDAZOLAM HCL (PF) 2 MG/2ML IJ SOLN
INTRAMUSCULAR | Status: DC | PRN
  Administered 2024-01-16: 1 mg via INTRAVENOUS

## 2024-01-16 MED ORDER — SODIUM CHLORIDE 0.9 % IV SOLN: INTRAVENOUS | Status: AC

## 2024-01-16 MED ORDER — HEPARIN SODIUM (PORCINE) 1000 UNIT/ML IJ SOLN
INTRAMUSCULAR | Status: AC
  Filled 2024-01-16: qty 10

## 2024-01-16 MED ORDER — HEPARIN SODIUM (PORCINE) 1000 UNIT/ML IJ SOLN
INTRAMUSCULAR | Status: DC | PRN
  Administered 2024-01-16: 5000 [IU] via INTRAVENOUS

## 2024-01-16 MED ORDER — CLOPIDOGREL BISULFATE 300 MG PO TABS
ORAL_TABLET | ORAL | Status: AC
  Filled 2024-01-16: qty 1

## 2024-01-16 MED ORDER — OXYCODONE HCL 5 MG PO TABS
5.0000 mg | ORAL_TABLET | ORAL | Status: DC | PRN
  Administered 2024-01-16: 10 mg via ORAL

## 2024-01-16 MED ORDER — IODIXANOL 320 MG/ML IV SOLN
INTRAVENOUS | Status: DC | PRN
  Administered 2024-01-16: 60 mL via INTRA_ARTERIAL

## 2024-01-16 MED ORDER — FENTANYL CITRATE (PF) 100 MCG/2ML IJ SOLN
INTRAMUSCULAR | Status: AC
  Filled 2024-01-16: qty 2

## 2024-01-16 MED ORDER — LABETALOL HCL 5 MG/ML IV SOLN
INTRAVENOUS | Status: DC
Start: 2024-01-16 — End: 2024-01-16
  Filled 2024-01-16: qty 4

## 2024-01-16 MED ORDER — CLOPIDOGREL BISULFATE 300 MG PO TABS
ORAL_TABLET | ORAL | Status: DC | PRN
  Administered 2024-01-16: 300 mg via ORAL

## 2024-01-16 MED ORDER — CLOPIDOGREL BISULFATE 75 MG PO TABS: 300.0000 mg | ORAL_TABLET | Freq: Once | ORAL | Status: DC

## 2024-01-16 MED ORDER — CLOPIDOGREL BISULFATE 75 MG PO TABS: 75.0000 mg | ORAL_TABLET | Freq: Every day | ORAL | Status: DC

## 2024-01-16 MED ORDER — HEPARIN (PORCINE) IN NACL 1000-0.9 UT/500ML-% IV SOLN
INTRAVENOUS | Status: DC | PRN
  Administered 2024-01-16: 1000 mL

## 2024-01-16 MED ORDER — SODIUM CHLORIDE 0.9 % IV SOLN: INTRAVENOUS | Status: DC

## 2024-01-16 MED ORDER — ONDANSETRON HCL 4 MG/2ML IJ SOLN: 4.0000 mg | Freq: Four times a day (QID) | INTRAMUSCULAR | Status: DC | PRN

## 2024-01-16 MED ORDER — ROPINIROLE HCL 0.5 MG PO TABS
0.5000 mg | ORAL_TABLET | Freq: Once | ORAL | Status: AC
  Administered 2024-01-16: 0.5 mg via ORAL
  Filled 2024-01-16: qty 1

## 2024-01-16 MED ORDER — HYDRALAZINE HCL 20 MG/ML IJ SOLN: 5.0000 mg | INTRAMUSCULAR | Status: DC | PRN

## 2024-01-16 MED ORDER — LIDOCAINE HCL (PF) 1 % IJ SOLN
INTRAMUSCULAR | Status: DC | PRN
  Administered 2024-01-16: 15 mL via INTRADERMAL

## 2024-01-16 MED ORDER — LIDOCAINE HCL (PF) 1 % IJ SOLN
INTRAMUSCULAR | Status: AC
  Filled 2024-01-16: qty 30

## 2024-01-16 MED ORDER — OXYCODONE HCL 5 MG PO TABS
ORAL_TABLET | ORAL | Status: AC
  Filled 2024-01-16: qty 2

## 2024-01-16 MED ORDER — ACETAMINOPHEN 325 MG PO TABS: 650.0000 mg | ORAL_TABLET | ORAL | Status: DC | PRN

## 2024-01-16 SURGICAL SUPPLY — 17 items
BALLOON MUSTANG 4.0X40 75 (BALLOONS) IMPLANT
CATH OMNI FLUSH 5F 65CM (CATHETERS) IMPLANT
CATH QUICKCROSS SUPP .035X90CM (MICROCATHETER) IMPLANT
GLIDEWIRE ADV .035X260CM (WIRE) IMPLANT
KIT ENCORE 26 ADVANTAGE (KITS) IMPLANT
KIT MICROPUNCTURE NIT STIFF (SHEATH) IMPLANT
KIT SINGLE USE MANIFOLD (KITS) IMPLANT
SET ATX-X65L (MISCELLANEOUS) IMPLANT
SHEATH INTROD PASSPORT 45 6.5F (SHEATH) IMPLANT
SHEATH PINNACLE 5F 10CM (SHEATH) IMPLANT
SHEATH PINNACLE 7F 10CM (SHEATH) IMPLANT
SHEATH PROBE COVER 6X72 (BAG) IMPLANT
STENT VIABAHN 6X19 6FR 135 (Permanent Stent) IMPLANT
STENT VIABAHN 6X39 6FR 80 (Permanent Stent) IMPLANT
TRAY PV CATH (CUSTOM PROCEDURE TRAY) ×2 IMPLANT
WIRE BENTSON .035X145CM (WIRE) IMPLANT
WIRE ROSEN-J .035X260CM (WIRE) IMPLANT

## 2024-01-16 NOTE — Discharge Instructions (Signed)
 Femoral Site Care This sheet gives you information about how to care for yourself after your procedure. Your health care provider may also give you more specific instructions. If you have problems or questions, contact your health care provider. What can I expect after the procedure?  After the procedure, it is common to have: Bruising that usually fades within 1-2 weeks. Tenderness at the site. Follow these instructions at home: Wound care Follow instructions from your health care provider about how to take care of your insertion site. Make sure you: Wash your hands with soap and water  before you change your bandage (dressing). If soap and water  are not available, use hand sanitizer. Remove your dressing as told by your health care provider. 24 hours Do not take baths, swim, or use a hot tub until your health care provider approves. You may shower 24-48 hours after the procedure or as told by your health care provider. Gently wash the site with plain soap and water . Pat the area dry with a clean towel. Do not rub the site. This may cause bleeding. Do not apply powder or lotion to the site. Keep the site clean and dry. Check your femoral site every day for signs of infection. Check for: Redness, swelling, or pain. Fluid or blood. Warmth. Pus or a bad smell. Activity For the first 2-3 days after your procedure, or as long as directed: Avoid climbing stairs as much as possible. Do not squat. Do not lift anything that is heavier than 10 lb (4.5 kg), or the limit that you are told, until your health care provider says that it is safe. For 5 days Rest as directed. Avoid sitting for a long time without moving. Get up to take short walks every 1-2 hours. Do not drive for 24 hours if you were given a medicine to help you relax (sedative). General instructions Take over-the-counter and prescription medicines only as told by your health care provider. Keep all follow-up visits as told by your  health care provider. This is important. Contact a health care provider if you have: A fever or chills. You have redness, swelling, or pain around your insertion site. Get help right away if: The catheter insertion area swells very fast. You pass out. You suddenly start to sweat or your skin gets clammy. The catheter insertion area is bleeding, and the bleeding does not stop when you hold steady pressure on the area. The area near or just beyond the catheter insertion site becomes pale, cool, tingly, or numb. These symptoms may represent a serious problem that is an emergency. Do not wait to see if the symptoms will go away. Get medical help right away. Call your local emergency services (911 in the [place] Do not drive yourself to the hospital. Summary After the procedure, it is common to have bruising that usually fades within 1-2 weeks. Check your femoral site every day for signs of infection. Do not lift anything that is heavier than 10 lb (4.5 kg), or the limit that you are told, until your health care provider says that it is safe. This information is not intended to replace advice given to you by your health care provider. Make sure you discuss any questions you have with your health care provider. Document Revised: 02/25/2017 Document Reviewed: 02/25/2017 Elsevier Patient Education  2020 [employer].

## 2024-01-16 NOTE — H&P (Signed)
 History and Physical Interval Note:  01/16/2024 8:13 AM  Linda Ray  has presented today for surgery, with the diagnosis of athersoclerosis of the superior mestenric artery.  The various methods of treatment have been discussed with the patient and family. After consideration of risks, benefits and other options for treatment, the patient has consented to  Procedure(s): ABDOMINAL AORTOGRAM W/LOWER EXTREMITY (N/A) Lower Extremity Angiography (N/A) VISCERAL ANGIOGRAPHY (N/A) VISCERAL ARTERY INTERVENTION (N/A) as a surgical intervention.  The patient's history has been reviewed, patient examined, no change in status, stable for surgery.  I have reviewed the patient's chart and labs.  Questions were answered to the patient's satisfaction.     Linda Ray     Patient name: Linda Ray   MRN: [id number]        DOB: 01/03/1935            Sex: female   REASON FOR CONSULT: Triage, abdominal pain    HPI: Linda Ray is a 89 y.o. female, with history of diabetes, hypertension, COPD that presents for triage visit for abdominal pain.  Patient states she has had increasing abdominal pain over the last month.  States she has lost about 40 pounds over the last several years and is down to 90 pounds in total weight.  States she cannot figure out an etiology for the pain.  At times it does come after eating and her daughter states it happened on Friday night after eating and other times she has no pain after eating.  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   Followed for PAU of supra-celiac aortic aneurysm.  I previously referred her to [facility] for custom modified four-vessel fenestration to treat her supraceliac aorta.  I did get a note from Dr. Missy and ultimately she has elected continued observation and a four-vessel endograft would require iliac conduit and she did not want to take on the risks of cardiac and  pulmonary complications.   We have also performed previous left brachial angioplasty.         Past Medical History:  Diagnosis Date   AAA (abdominal aortic aneurysm)     Anemia     Arthritis     Borderline diabetes     Breast cancer (HCC)     CHF (congestive heart failure) (HCC)     Chronic back pain     COPD (chronic obstructive pulmonary disease) (HCC)     Diabetes mellitus without complication (HCC)     Hypertension     RLS (restless legs syndrome)                 Past Surgical History:  Procedure Laterality Date   ABDOMINAL SURGERY   1954   AORTIC ARCH ANGIOGRAPHY N/A 04/14/2020    Procedure: AORTIC ARCH ANGIOGRAPHY;  Surgeon: Ray Linda JINNY, MD;  Location: [facility];  Service: Cardiovascular;  Laterality: N/A;   BACK SURGERY   2009   BREAST SURGERY       COLONOSCOPY        [facility]   MASTECTOMY Left 2007   MASTECTOMY       TONSILLECTOMY                   Family History  Problem Relation Age of Onset   Colon cancer Mother          age 18  Stroke Father     Cancer Sister          breast, bladder, kidney          SOCIAL HISTORY: Social History         Socioeconomic History   Marital status: Widowed      Spouse name: Not on file   Number of children: 2   Years of education: Not on file   Highest education level: Not on file  Occupational History   Occupation: retired  Tobacco Use   Smoking status: Every Day      Current packs/day: 1.00      Average packs/day: 1 pack/day for 65.0 years (65.0 ttl pk-yrs)      Types: Cigarettes   Smokeless tobacco: Never   Tobacco comments:      currently smoking 1ppd 07/30/2016  Vaping Use   Vaping status: Never Used  Substance and Sexual Activity   Alcohol use: Yes      Comment: occasionally   Drug use: No   Sexual activity: Not on file  Other Topics Concern   Not on file  Social History Narrative    Current Smoker, smoking 21-30 Cigarettes Daily    Alcohol, Yes - rare               Social Drivers of Health        Financial Resource Strain: Low Risk  (09/18/2023)    Received from [facility]    Overall Financial Resource Strain (CARDIA)     How hard is it for you to pay for the very basics like food, housing, medical care, and heating?: Not hard at all  Food Insecurity: No Food Insecurity (09/18/2023)    Received from [facility]    Hunger Vital Sign     Within the past 12 months, you worried that your food would run out before you got the money to buy more.: Never true     Within the past 12 months, the food you bought just didn't last and you didn't have money to get more.: Never true  Transportation Needs: No Transportation Needs (09/18/2023)    Received from [facility] - Transportation     In the past 12 months, has lack of transportation kept you from medical appointments or from getting medications?: No     In the past 12 months, has lack of transportation kept you from meetings, work, or from getting things needed for daily living?: No  Physical Activity: Inactive (09/18/2023)    Received from [facility]    Exercise Vital Sign     On average, how many days per week do you engage in moderate to strenuous exercise (like a brisk walk)?: 0 days     Minutes of Exercise per Session: Not on file  Stress: No Stress Concern Present (09/18/2023)    Received from [facility] - Occupational Stress Questionnaire     Do you feel stress - tense, restless, nervous, or anxious, or unable to sleep at night because your mind is troubled all the time - these days?: Not at all  Social Connections: Socially Integrated (09/18/2023)    Received from [facility]    Social Network     How would you rate your social network (family, work, friends)?: Good participation with social networks  Intimate Partner Violence: Not At Risk (09/18/2023)    Received from [facility]    HITS  Over the last 12 months how often  did your partner physically hurt you?: Never     Over the last 12 months how often did your partner insult you or talk down to you?: Never     Over the last 12 months how often did your partner threaten you with physical harm?: Never     Over the last 12 months how often did your partner scream or curse at you?: Never      Allergies       Allergies  Allergen Reactions   Sulfa Drugs Cross Reactors Nausea And Vomiting   Tetanus-Diphth-Acell Pertussis Swelling      Significant local reaction   Codeine Nausea And Vomiting   Metformin And Related Diarrhea   Sulfa Antibiotics Itching              Current Outpatient Medications  Medication Sig Dispense Refill   albuterol  (PROVENTIL  HFA;VENTOLIN  HFA) 108 (90 Base) MCG/ACT inhaler Inhale 1-2 puffs into the lungs every 6 (six) hours as needed for wheezing or shortness of breath. 1 each 0   aspirin  81 MG tablet Take 81 mg by mouth daily.       atorvastatin  (LIPITOR) 10 MG tablet Take 1 tablet (10 mg total) by mouth daily. 30 tablet 11   B Complex-C (B-COMPLEX WITH VITAMIN C) tablet Take 1 tablet by mouth daily.       olmesartan (BENICAR) 20 MG tablet Take 10 mg by mouth daily.       rOPINIRole (REQUIP) 0.5 MG tablet Take 0.5 mg by mouth daily.          No current facility-administered medications for this visit.        REVIEW OF SYSTEMS:  [X]  denotes positive finding, [ ]  denotes negative finding Cardiac   Comments:  Chest pain or chest pressure:      Shortness of breath upon exertion:      Short of breath when lying flat:      Irregular heart rhythm:             Vascular      Pain in calf, thigh, or hip brought on by ambulation:      Pain in feet at night that wakes you up from your sleep:       Blood clot in your veins:      Leg swelling:              Pulmonary      Oxygen at home:      Productive cough:       Wheezing:              Neurologic      Sudden weakness in arms or legs:       Sudden numbness in arms or legs:        Sudden onset of difficulty speaking or slurred speech:      Temporary loss of vision in one eye:       Problems with dizziness:              Gastrointestinal      Blood in stool:       Vomited blood:              Genitourinary      Burning when urinating:       Blood in urine:             Psychiatric      Major depression:  Hematologic      Bleeding problems:      Problems with blood clotting too easily:             Skin      Rashes or ulcers:             Constitutional      Fever or chills:          PHYSICAL EXAM: There were no vitals filed for this visit.     GENERAL: The patient is a well-nourished female, in no acute distress. The vital signs are documented above. CARDIAC: There is a regular rate and rhythm.  VASCULAR:  Left radial pulse palpable Bilateral femoral pulses palpable No palpable pedal pulses Abdomen: No pain with palpation   DATA:    CTA C/A/P 11/27/23:   IMPRESSION: 1. No thoracic aortic aneurysm. Coronary artery atherosclerotic vascular disease. Arch vessel vascular disease which is worst in the left subclavian artery. 2. Similar appearance of right upper lobe cavitary nodule measuring 1.5 cm. 3. 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 4. Suspected progression of disease within the SMA which is either severely stenotic or focally occluded. Severe disease is also present in the celiac artery.     Electronically Signed   By: Maude Naegeli M.D.   On: 11/27/2023 10:33   CTA chest abdomen pelvis 11/27/2023 with similar appearance of thrombosed saccular aneurysm now measuring about 4 cm in maximal diameter   Assessment/Plan:   89 y.o. female, with history of diabetes, hypertension, COPD that presents for triage visit for abdominal pain.  Patient states she has had increasing abdominal pain over the last month.  States  she has lost about 40 pounds over the last several years and is down to 90 pounds in total weight.  States she cannot figure out an etiology for the pain.  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   She does have a referral to GI which is still pending.  Discussed that some of her symptoms are consistent with chronic mesenteric ischemia given weight loss and intermittent pain after eating although at other times she does not have postprandial pain which is unusual.  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  I have offered her the option of mesenteric angiogram with possible mesenteric intervention particularly if she has ongoing weight loss with some postprandial pain.  Will get scheduled in the Cath Lab.  Discussed moderate sedation.  Discussed transfemoral access.  Discussed risk of vessel injury with mesenteric stenting including risk of rupture and embolization leading to bowel ischemia.   Linda DOROTHA Gaskins, MD [facility] Office: [phone]

## 2024-01-16 NOTE — Progress Notes (Signed)
 Site area: Righr groin Site Prior to Removal:  Level 0 Pressure Applied For: 1 hour total including additional hold time due to hematoma Manual:   Yes Patient Status During Pull:  Stable Post Pull Site:  Level Stable Post Pull Instructions Given: Yes  Post Pull Pulses Present: Bilateral Dp's present Dressing Applied:  Gauze and tegaderm Bedrest begins @ 1400 Comments:

## 2024-01-16 NOTE — Op Note (Signed)
 Patient name: Linda Ray MRN: [id number] DOB: 01-Jun-1934 Sex: female  01/16/2024 Pre-operative Diagnosis: Chronic mesenteric ischemia Post-operative diagnosis:  Same Surgeon:  Lonni DOROTHA Gaskins, MD Procedure Performed: 1.  Ultrasound-guided access right common femoral artery 2.  Aortogram including mesenteric arteriogram with catheter selection of aorta 3.  Catheter selection of SMA with hand-injection 4.  SMA angioplasty (predilated with 4 mm x 40 mm Mustang) 5.  SMA stent placement x 2 (6 mm x 39 mm VBX and 6 mm x 19 mm VBX) 6.  49 minutes of monitored moderate conscious sedation time  Indications: Patient is an 89 year old female previously followed for a supraceliac aneurysm.  Now with evidence of chronic mesenteric ischemia with evidence of high-grade disease in her SMA and celiac arteries.  She presents for mesenteric angiogram with intervention after risk-benefits discussed.  Findings:   Ultrasound-guided access right common femoral artery.  Catheter selection of aorta with mesenteric angiogram showed a high-grade stenosis in both the celiac and SMA.  The SMA had a 99% calcified stenosis and did reconstitute distally and the vessel did have diffuse disease.  Ultimately elected to intervene on the SMA and I was able to get a Glidewire advantage with a steerable sheath into the SMA through the disease antegrade.  I had a difficult time getting a catheter to track so I ultimately elected to predilate this with a 4 mm Mustang just beyond the ostium where the 99% stenosis was identified.  This was initially stented with a 6 mm x 39 mm VBX and then I extended proximally at the ostium with a 6 mm x 19 mm VBX.  Widely patent stents at completion with no residual stenosis.  Good filling SMA distally.   Procedure:  The patient was identified in the holding area and taken to room 8.  The patient was then placed supine on the table and prepped and draped in the usual sterile fashion.  A time  out was called.  The patient received Versed  and fentanyl  for conscious moderate sedation.  Vital signs are monitored including heart rate, respiratory rate, oxygenation and blood pressure.  I was present for all moderate sedation.  Ultrasound was used to evaluate the right common femoral artery.  It was patent .  A digital ultrasound image was acquired.  A micropuncture needle was used to access the right common femoral artery under ultrasound guidance.  An 018 wire was advanced without resistance and a micropuncture sheath was placed.  The 018 wire was removed and a benson wire was placed.  The micropuncture sheath was exchanged for a 5 french sheath.  An omniflush catheter was advanced over the wire to the level of L-1.  An abdominal angiogram was obtained with mesenteric angiogram.  Ultimately elected for intervention.  I then used a Glidewire advantage and exchanged for a 6.5 French steerable sheath.  The patient was given 100 units/kg IV heparin .  I then used the steerable sheath with a Glidewire advantage to get in the SMA antegrade.  I had to use a quick cross catheter and had some difficulty getting this to track antegrade due to the high-grade 99% stenosis in the proximal SMA.  Finally got wire and catheter through the diseased segment and then exchanged for a Rosen wire after I confirmed with hand-injection I was in the true lumen distally.  Ultimately the lesion was then predilated with a 4 mm x 40 mm Mustang.  I then elected to stent this initially with a 6  mm x 39 mm VBX in the SMA beyond the ostium where the high-grade 99 percent stenosis was identified.  We had excellent results after this.  There was some significant residual calcified disease around the ostium so I elected to extend with a second 6 mm x 19 mm VBX in the proximal vessel to nominal pressure.  Widely patent stents at completion with good filling of the vessel distally.  Wires and catheters were removed.  I put a short 7 French sheath  in the right groin.  Taken to holding for sheath removal.  Plan: Excellent results after SMA stenting today for 99% stenosis.  Aspirin  Plavix  statin.  Will arrange follow-up in 1 month with mesenteric duplex.     Lonni DOROTHA Gaskins, MD [facility] [place] [facility] Office: [phone]

## 2024-01-17 ENCOUNTER — Encounter (HOSPITAL_COMMUNITY): Payer: Self-pay | Admitting: Vascular Surgery

## 2024-01-17 LAB — POCT ACTIVATED CLOTTING TIME: Activated Clotting Time: 199 s

## 2024-01-20 MED FILL — Labetalol HCl IV Soln Prefilled Syringe 20 MG/4ML (5 MG/ML): INTRAVENOUS | Qty: 4 | Status: AC

## 2024-01-22 ENCOUNTER — Other Ambulatory Visit: Payer: Self-pay

## 2024-01-22 DIAGNOSIS — K551 Chronic vascular disorders of intestine: Secondary | ICD-10-CM

## 2024-01-31 ENCOUNTER — Encounter (HOSPITAL_BASED_OUTPATIENT_CLINIC_OR_DEPARTMENT_OTHER): Payer: Self-pay | Admitting: Emergency Medicine

## 2024-01-31 ENCOUNTER — Emergency Department (HOSPITAL_BASED_OUTPATIENT_CLINIC_OR_DEPARTMENT_OTHER)
Admission: EM | Admit: 2024-01-31 | Discharge: 2024-01-31 | Disposition: A | Discharge status: Home / Self Care | Attending: Emergency Medicine | Admitting: Emergency Medicine

## 2024-01-31 ENCOUNTER — Emergency Department (HOSPITAL_BASED_OUTPATIENT_CLINIC_OR_DEPARTMENT_OTHER)

## 2024-01-31 ENCOUNTER — Other Ambulatory Visit: Payer: Self-pay

## 2024-01-31 DIAGNOSIS — D500 Iron deficiency anemia secondary to blood loss (chronic): Secondary | ICD-10-CM

## 2024-01-31 DIAGNOSIS — Z7982 Long term (current) use of aspirin: Secondary | ICD-10-CM | POA: Insufficient documentation

## 2024-01-31 DIAGNOSIS — X58XXXA Exposure to other specified factors, initial encounter: Secondary | ICD-10-CM | POA: Insufficient documentation

## 2024-01-31 DIAGNOSIS — I723 Aneurysm of iliac artery: Secondary | ICD-10-CM | POA: Insufficient documentation

## 2024-01-31 DIAGNOSIS — Z7902 Long term (current) use of antithrombotics/antiplatelets: Secondary | ICD-10-CM | POA: Insufficient documentation

## 2024-01-31 DIAGNOSIS — R109 Unspecified abdominal pain: Secondary | ICD-10-CM

## 2024-01-31 DIAGNOSIS — S3012XA Contusion of groin, initial encounter: Secondary | ICD-10-CM | POA: Insufficient documentation

## 2024-01-31 DIAGNOSIS — R1110 Vomiting, unspecified: Secondary | ICD-10-CM

## 2024-01-31 LAB — URINALYSIS, ROUTINE W REFLEX MICROSCOPIC
Bacteria, UA: NONE SEEN
Bilirubin Urine: NEGATIVE
Glucose, UA: NEGATIVE mg/dL
Hgb urine dipstick: NEGATIVE
Ketones, ur: NEGATIVE mg/dL
Nitrite: NEGATIVE
Protein, ur: 30 mg/dL — AB
Specific Gravity, Urine: 1.019 (ref 1.005–1.030)
pH: 5.5 (ref 5.0–8.0)

## 2024-01-31 LAB — COMPREHENSIVE METABOLIC PANEL WITH GFR
ALT: 16 U/L (ref 0–44)
AST: 20 U/L (ref 15–41)
Albumin: 3.7 g/dL (ref 3.5–5.0)
Alkaline Phosphatase: 59 U/L (ref 38–126)
Anion gap: 9 (ref 5–15)
BUN: 21 mg/dL (ref 8–23)
CO2: 27 mmol/L (ref 22–32)
Calcium: 9.6 mg/dL (ref 8.9–10.3)
Chloride: 102 mmol/L (ref 98–111)
Creatinine, Ser: 1.11 mg/dL — ABNORMAL HIGH (ref 0.44–1.00)
GFR, Estimated: 48 mL/min — ABNORMAL LOW (ref >60)
Glucose, Bld: 134 mg/dL — ABNORMAL HIGH (ref 70–99)
Potassium: 4.5 mmol/L (ref 3.5–5.1)
Sodium: 139 mmol/L (ref 135–145)
Total Bilirubin: 0.7 mg/dL (ref 0.0–1.2)
Total Protein: 6.6 g/dL (ref 6.5–8.1)

## 2024-01-31 LAB — CBC
HCT: 28.7 % — ABNORMAL LOW (ref 36.0–46.0)
Hemoglobin: 9.4 g/dL — ABNORMAL LOW (ref 12.0–15.0)
MCH: 31.1 pg (ref 26.0–34.0)
MCHC: 32.8 g/dL (ref 30.0–36.0)
MCV: 95 fL (ref 80.0–100.0)
Platelets: 397 K/uL (ref 150–400)
RBC: 3.02 MIL/uL — ABNORMAL LOW (ref 3.87–5.11)
RDW: 15.9 % — ABNORMAL HIGH (ref 11.5–15.5)
WBC: 8.2 K/uL (ref 4.0–10.5)
nRBC: 0 % (ref 0.0–0.2)

## 2024-01-31 LAB — LIPASE, BLOOD: Lipase: <10 U/L — ABNORMAL LOW (ref 11–51)

## 2024-01-31 LAB — OCCULT BLOOD X 1 CARD TO LAB, STOOL: Fecal Occult Bld: NEGATIVE

## 2024-01-31 LAB — LACTIC ACID, PLASMA: Lactic Acid, Venous: 1.1 mmol/L (ref 0.5–1.9)

## 2024-01-31 MED ORDER — IOHEXOL 350 MG/ML SOLN
100.0000 mL | Freq: Once | INTRAVENOUS | Status: AC | PRN
  Administered 2024-01-31: 70 mL via INTRAVENOUS

## 2024-01-31 NOTE — ED Notes (Signed)
 Patient transported to CT

## 2024-01-31 NOTE — ED Notes (Signed)
 ED Provider at bedside.

## 2024-01-31 NOTE — ED Provider Notes (Signed)
 [facility] AT [name] Provider Note   CSN: [id number] Arrival date & time: 01/31/24  1448     Patient presents with: Emesis   Linda Ray is a 89 y.o. female.   Patient presents with intermittent vomiting sporadic and mild diarrhea both nonbloody nonbilious.  Few weeks ago patient had 2 stents placed for chronic bowel ischemia.  Patient's had intermittent mild discomfort but no persistent abdominal pain.  Patient noticed ecchymosis anterior left hip few days ago without any trauma.  Patient denies any melena or or blood in the stool.  Patient has not had a colonoscopy before.  Patient has had weight loss since workup for bowel ischemia.  No fever or chills.  The history is provided by the patient.  Emesis Associated symptoms: abdominal pain and diarrhea   Associated symptoms: no chills, no fever and no headaches        Prior to Admission medications   Medication Sig Start Date End Date Taking? Authorizing Provider  albuterol  (PROVENTIL  HFA;VENTOLIN  HFA) 108 (90 Base) MCG/ACT inhaler Inhale 1-2 puffs into the lungs every 6 (six) hours as needed for wheezing or shortness of breath. 06/12/18   [place], Eric J, DO  aspirin  81 MG tablet Take 81 mg by mouth daily.    [provider]  atorvastatin  (LIPITOR) 10 MG tablet Take 1 tablet (10 mg total) by mouth daily. 04/14/20 01/16/24  Gretta Lonni PARAS, MD  B Complex-C (B-COMPLEX WITH VITAMIN C) tablet Take 1 tablet by mouth daily.    [provider]  clopidogrel  (PLAVIX ) 75 MG tablet Take 1 tablet (75 mg total) by mouth daily. 01/16/24 01/15/25  Gretta Lonni PARAS, MD  escitalopram (LEXAPRO) 5 MG tablet Take 5 mg by mouth daily. Patient not taking: Reported on 01/10/2024 07/24/23   [provider]  olmesartan (BENICAR) 20 MG tablet Take 10 mg by mouth daily.    [provider]  rOPINIRole  (REQUIP ) 0.5 MG tablet Take 0.5 mg by mouth daily. 11/20/23   [provider]  trolamine salicylate (ASPERCREME) 10 % cream Apply 1 Application topically daily as needed for muscle pain.    [provider]    Allergies: Sulfa drugs cross reactors, Tetanus-diphth-acell pertussis, Codeine, and Metformin and related    Review of Systems  Constitutional:  Positive for unexpected weight change. Negative for chills and fever.  HENT:  Negative for congestion.   Eyes:  Negative for visual disturbance.  Respiratory:  Negative for shortness of breath.   Cardiovascular:  Negative for chest pain.  Gastrointestinal:  Positive for abdominal pain, diarrhea and vomiting.  Genitourinary:  Negative for dysuria and flank pain.  Musculoskeletal:  Negative for back pain, neck pain and neck stiffness.  Skin:  Negative for rash.  Neurological:  Negative for light-headedness and headaches.    Updated Vital Signs BP (!) 131/53 (BP Location: Right Arm)   Pulse 77   Temp 97.9 F (36.6 C) (Oral)   Resp 20   SpO2 100%   Physical Exam Vitals and nursing note reviewed.  Constitutional:      General: She is not in acute distress.    Appearance: She is well-developed.  HENT:     Head: Normocephalic and atraumatic.     Mouth/Throat:     Mouth: Mucous membranes are moist.  Eyes:     General:        Right eye: No discharge.        Left eye: No discharge.  Conjunctiva/sclera: Conjunctivae normal.  Neck:     Trachea: No tracheal deviation.  Cardiovascular:     Rate and Rhythm: Normal rate and regular rhythm.  Pulmonary:     Effort: Pulmonary effort is normal.     Breath sounds: Normal breath sounds.  Abdominal:     General: There is no distension.     Palpations: Abdomen is soft.     Tenderness: There is abdominal tenderness (mild disdcomfort central). There is no guarding.  Musculoskeletal:        General: No swelling.     Cervical back: Normal range of motion and neck supple. No rigidity.  Skin:    General: Skin is warm.     Capillary Refill: Capillary  refill takes less than 2 seconds.     Findings: No rash.  Neurological:     General: No focal deficit present.     Mental Status: She is alert.  Psychiatric:        Mood and Affect: Mood normal.     (all labs ordered are listed, but only abnormal results are displayed) Labs Reviewed  LIPASE, BLOOD - Abnormal; Notable for the following components:      Result Value   Lipase <10 (*)    All other components within normal limits  COMPREHENSIVE METABOLIC PANEL WITH GFR - Abnormal; Notable for the following components:   Glucose, Bld 134 (*)    Creatinine, Ser 1.11 (*)    GFR, Estimated 48 (*)    All other components within normal limits  CBC - Abnormal; Notable for the following components:   RBC 3.02 (*)    Hemoglobin 9.4 (*)    HCT 28.7 (*)    RDW 15.9 (*)    All other components within normal limits  URINALYSIS, ROUTINE W REFLEX MICROSCOPIC - Abnormal; Notable for the following components:   Protein, ur 30 (*)    Leukocytes,Ua TRACE (*)    All other components within normal limits  OCCULT BLOOD X 1 CARD TO LAB, STOOL  LACTIC ACID, PLASMA    EKG: None  Radiology: CT Angio Abd/Pel W and/or Wo Contrast Addendum Date: 01/31/2024 ADDENDUM: I discussed these findings with Dr. Tonia in the emergency room at 07:50 pm - Electronically signed by: Dorethia Molt MD 01/31/2024 08:13 PM EST RP Workstation: [id number]   Result Date: 01/31/2024 ORIGINAL REPORT EXAM: CTA ABDOMEN AND PELVIS WITHOUT AND WITH CONTRAST 01/31/2024 06:53:53 PM TECHNIQUE: CTA images of the abdomen and pelvis without and with intravenous contrast. 70 mL of iohexol  (OMNIPAQUE ) 350 MG/ML injection was administered. Three-dimensional MIP/volume rendered formations were performed. Automated exposure control, iterative reconstruction, and/or weight based adjustment of the mA/kV was utilized to reduce the radiation dose to as low as reasonably achievable. COMPARISON: 11/27/2023 CLINICAL HISTORY: aneurysm, recent stents,  pain, vomiting FINDINGS: VASCULATURE: AORTA: Moderate atherosclerotic calcifications are again seen within the visualized distal thoracic aorta. 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 No significant recanalization of the thrombosed aneurysm since the prior examination. As noted previously, the aneurysm involves the origin of the celiac axis. Extensive mixed atherosclerotic plaque within the abdominal aorta without aneurysm. Focal areas of plaque ulceration are again identified involving the atheromatous plaque along the right posterolateral margin of the lumen. This appears unchanged. Calcified atherosclerotic plaque results in a high-grade (near occlusion) stenosis of the inferior mesenteric artery at its origin. Distally,  this vessel is widely patent. No dissection. CELIAC TRUNK: The aneurysm involves the origin of the celiac axis, which demonstrates a high-grade (near occlusive) stenosis at its origin secondary to mass effect as well as a focus of atherosclerotic calcification. Distally, the vessel is widely patent and demonstrates standard anatomic configuration. SUPERIOR MESENTERIC ARTERY: Interval stenting of the proximal superior mesenteric artery with wide patency of the stented segment. Distal to the stent, the luminal diameter of the superior mesenteric artery is enlarged, measuring 9 mm, secondary to a patent long segment dissection with dissection flap partially visualized at image 48/5 and 76/5. While this does not appear flow-limiting, several truncated jejunal branches are identified which appeared distally reconstituted (by example, the second and third jejunal branches at image 62/5 and 77/5). Distally, the branch vessels are widely patent. RENAL ARTERIES: The renal arteries are bilaterally widely patent and demonstrate normal vascular morphology. No aneurysmal  dissection. Tiny accessory left renal artery noted involving the lower pole. ILIAC ARTERIES: Extensive atherosclerotic calcification as well as the lower extremity arterial inflow bilaterally with multifocal near 50% stenosis of the distal right external iliac artery. No hemodynamically significant stenosis of the left lower extremity arterial inflow. Common iliac arteries demonstrate stable focal fusiform aneurysm in the right just above the bifurcation measuring 14 mm and a focal penetrating atherosclerotic ulcer on the left (89/5). No cardiovascular hematoma identified. Internal iliac arteries are patent at their origins though a critical stenosis of the right internal iliac artery is noted. Postenotic dilation of the right internal iliac artery proximally measuring up to 10 mm in diameter, stable. A patent pseudoaneurysm of the right common iliac artery communicating via a short neck (135/5) is now seen within the right inguinal region measuring 1.9 x 2.8 x 2.8 cm (13/05, 24/11). This is new from the prior examination. Extensive surrounding hematoma within the right rectus sheath inferiorly. Visualized lower extremity arterial outflow bilaterally is widely patent. LIVER: The liver is unremarkable. GALLBLADDER AND BILE DUCTS: Gallbladder is unremarkable. No biliary ductal dilatation. SPLEEN: The spleen is unremarkable. PANCREAS: The pancreas is unremarkable. ADRENAL GLANDS: Bilateral adrenal glands demonstrate no acute abnormality. KIDNEYS, URETERS AND BLADDER: Relative hypoenhancement of the lower pole of the right kidney is again identified suggesting delayed or absent filling of an accessory lower pole vessel. This is unchanged from the prior examination. The kidneys are otherwise unremarkable. No stones in the kidneys or ureters. No hydronephrosis. No perinephric or periureteral stranding. Urinary bladder is unremarkable. GI AND BOWEL: Evaluation of the bowel was limited by the early phase of contrast  enhancement. The bowel is grossly unremarkable. Stomach and duodenal sweep demonstrate no acute abnormality. There is no bowel obstruction. No abnormal bowel wall thickening or distension. REPRODUCTIVE: Reproductive organs are unremarkable. PERITONEUM AND RETRPERITONEUM: No ascites or free air. LUNG BASE: Nodular scarring at the right lung base appears stable. LYMPH NODES: No lymphadenopathy. BONES AND SOFT TISSUES: Osseous structures are age-appropriate. No acute bone abnormality. No lytic or blastic bone lesion. Extensive surrounding hematoma within the right rectus sheath inferiorly associated with the right common iliac artery pseudoaneurysm. IMPRESSION: 1. 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 Urgent vascular consultation is recommended for further management. 2. 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 Interval stenting of the ureter superior mesenteric artery origin with wide patency of the stent to the segment. 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 4. Extensive atherosclerotic calcification of the lower extremity arterial inflow bilaterally with multifocal near 50% stenosis of the distal right external iliac artery, . Superimposed common iliac artery aneurysm and penetrating atherosclerotic ulcers, as  described above, unchanged from prior examination. 5. 6. . Electronically signed by: Dorethia Molt MD 01/31/2024 07:48 PM EST RP Workstation: [id number]  Procedures   Medications Ordered in the ED  iohexol  (OMNIPAQUE ) 350 MG/ML injection 100 mL (70 mLs Intravenous Contrast Given 01/31/24 1854)                                    Medical Decision Making Amount and/or Complexity of Data Reviewed Labs: ordered. Radiology: ordered.  Risk Prescription drug management.   Patient with history of bowel ischemia stents placed by Dr. Gretta 2 weeks ago presents with intermittent vomiting and few episodes of diarrhea.  Patient is also had mild discomfort and persistent weight loss.  Differential includes side effect/complication from stent placement, acute gastritis from viral/toxin mediated, worsening vascular disease, aneurysm, colitis, biliary, other.  Plan for CT angiogram for further delineation of intermittent signs and symptoms.  Blood work independently reviewed revealing mild hyponatremia 134, creatinine 1.1 overall similar to previous and hemoglobin dropped 2 points 9.4 compared to November 20 medical records reviewed.  Hemoccult sent as well.  Patient comfortable plan.  No vomiting in the ED.  Patient ambulated with her walker without difficulty to the bathroom and back.  Patient asking if she can eat and go home. Radiology called with results from CT angiogram describing focal dissection near stent SMA, pseudoaneurysm and hematoma in the groin.  Patient not having any significant pain in the groin.  Hemoglobin 9.4.  Discussed the case in detail with Dr. Gretta vascular surgery who reviewed images independently and they will plan for repair on Tuesday/appointment.  Patient comfortable this plan and strict reasons to return to [facility] over the weekend.  Family with patient.     Final diagnoses:  Vomiting in adult  Acute abdominal pain  Iliac artery pseudoaneurysm  Hematoma of  groin, initial encounter    ED Discharge Orders     None          Tonia Chew, MD 01/31/24 2018

## 2024-01-31 NOTE — ED Notes (Signed)
 Reviewed discharge instructions and follow-up care with pt. Pt verbalized understanding and had no further questions. Pt exited ED without complications.

## 2024-01-31 NOTE — Discharge Instructions (Signed)
 See your vascular surgeon on Tuesday call to clarify appointment time. Take your medications as previously prescribed. If you have severe abdominal pain, groin pain, increased groin swelling go to [facility] emergency room over the weekend.  If you do fairly well over the weekend they will plan your pseudoaneurysm repair on Tuesday.

## 2024-01-31 NOTE — ED Triage Notes (Signed)
 I'm one sick chicken Had GI stents placed 2 weeks ago Has been vomiting and has diarrhea, abdo cramping  Also has some un known bruising on buttock she is concerned with

## 2024-01-31 NOTE — Progress Notes (Addendum)
 Presented with some intermittent abdominal pain.  Reviewed CT imaging showing patent SMA stent with a dissection distal to the stent that is not flow-limiting and filling of all the SMA branches.  She's already on dual antiplatelet therapy.  White count is normal.  Noted 2 cm pseudoaneurysm in the right groin from transfemoral access.  I offered transfer to [facility].  Per ED provider patient is wanting to go home.  I will arrange office visit on Tuesday.  I discussed if she has increasing groin pain she needs to come to [facility] for vascular surgery evaluation.    Lonni DOROTHA Gaskins, MD [facility] Office: [phone]   [name]

## 2024-02-03 ENCOUNTER — Other Ambulatory Visit: Payer: Self-pay

## 2024-02-03 DIAGNOSIS — I729 Aneurysm of unspecified site: Secondary | ICD-10-CM

## 2024-02-04 ENCOUNTER — Ambulatory Visit (HOSPITAL_COMMUNITY): Admission: RE | Admit: 2024-02-04 | Discharge: 2024-02-04 | Attending: Surgery | Admitting: Surgery

## 2024-02-04 ENCOUNTER — Other Ambulatory Visit: Payer: Self-pay

## 2024-02-04 ENCOUNTER — Ambulatory Visit (INDEPENDENT_AMBULATORY_CARE_PROVIDER_SITE_OTHER): Admitting: Vascular Surgery

## 2024-02-04 ENCOUNTER — Encounter: Payer: Self-pay | Admitting: Vascular Surgery

## 2024-02-04 VITALS — BP 137/66 | HR 81 | Temp 98.1°F | Resp 20 | Ht 60.0 in | Wt 92.1 lb

## 2024-02-04 DIAGNOSIS — I724 Aneurysm of artery of lower extremity: Secondary | ICD-10-CM | POA: Diagnosis present

## 2024-02-04 DIAGNOSIS — I729 Aneurysm of unspecified site: Secondary | ICD-10-CM

## 2024-02-04 DIAGNOSIS — T81718A Complication of other artery following a procedure, not elsewhere classified, initial encounter: Secondary | ICD-10-CM | POA: Diagnosis present

## 2024-02-04 DIAGNOSIS — K551 Chronic vascular disorders of intestine: Secondary | ICD-10-CM | POA: Diagnosis present

## 2024-02-04 NOTE — Progress Notes (Signed)
 Patient name: Linda Ray MRN: [id number] DOB: [birth year] Sex: female  REASON FOR CONSULT: Triage, right groin pseudoaneurysm  HPI: Linda Ray is a 89 y.o. female, with multiple risk factors that presents for triage visit due to a right groin pseudoaneurysm.  Most recent underwent ultrasound-guided access right common femoral artery on 01/16/2024 with SMA stenting for chronic mesenteric ischemia.  Was in the ED on Friday night at [facility] and had a CT for some abdominal pain and noted right femoral pseudoaneurysm.  States she is eating better.  Does have swelling in the groin but is not painful.  Past Medical History:  Diagnosis Date   AAA (abdominal aortic aneurysm)    Anemia    Arthritis    Borderline diabetes    Breast cancer (HCC)    CHF (congestive heart failure) (HCC)    Chronic back pain    COPD (chronic obstructive pulmonary disease) (HCC)    Diabetes mellitus without complication (HCC)    Hypertension    RLS (restless legs syndrome)     Past Surgical History:  Procedure Laterality Date   ABDOMINAL AORTOGRAM W/LOWER EXTREMITY N/A 01/16/2024   Procedure: ABDOMINAL AORTOGRAM W/LOWER EXTREMITY;  Surgeon: Gretta Lonni PARAS, MD;  Location: [facility];  Service: Cardiovascular;  Laterality: N/A;   ABDOMINAL SURGERY  1954   AORTIC ARCH ANGIOGRAPHY N/A 04/14/2020   Procedure: AORTIC ARCH ANGIOGRAPHY;  Surgeon: Gretta Lonni PARAS, MD;  Location: [facility];  Service: Cardiovascular;  Laterality: N/A;   BACK SURGERY  2009   BREAST SURGERY     COLONOSCOPY     [facility]   MASTECTOMY Left 2007   MASTECTOMY     TONSILLECTOMY     VISCERAL ANGIOGRAPHY N/A 01/16/2024   Procedure: VISCERAL ANGIOGRAPHY;  Surgeon: Gretta Lonni PARAS, MD;  Location: [facility];  Service: Cardiovascular;  Laterality: N/A;   VISCERAL ARTERY INTERVENTION N/A 01/16/2024   Procedure: VISCERAL ARTERY INTERVENTION;  Surgeon: Gretta Lonni PARAS, MD;  Location: [facility];  Service: Cardiovascular;  Laterality: N/A;    Family History  Problem Relation Age of Onset   Colon cancer Mother        age 56   Stroke Father    Cancer Sister        breast, bladder, kidney    SOCIAL HISTORY: Social History   Socioeconomic History   Marital status: Widowed    Spouse name: Not on file   Number of children: 2   Years of education: Not on file   Highest education level: Not on file  Occupational History   Occupation: retired  Tobacco Use   Smoking status: Every Day    Current packs/day: 1.00    Average packs/day: 1 pack/day for 65.0 years (65.0 ttl pk-yrs)    Types: Cigarettes   Smokeless tobacco: Never   Tobacco comments:    currently smoking 1ppd 07/30/2016  Vaping Use   Vaping status: Never Used  Substance and Sexual Activity   Alcohol use: Yes    Comment: occasionally   Drug use: No   Sexual activity: Not on file  Other Topics Concern   Not on file  Social History Narrative   Current Smoker, smoking 21-30 Cigarettes Daily   Alcohol, Yes - rare         Social Drivers of Health   Financial Resource Strain: Low Risk (09/18/2023)   Received from [facility]   Overall Financial Resource Strain (CARDIA)  How hard is it for you to pay for the very basics like food, housing, medical care, and heating?: Not hard at all  Food Insecurity: No Food Insecurity (09/18/2023)   Received from [facility]   Hunger Vital Sign    Within the past 12 months, you worried that your food would run out before you got the money to buy more.: Never true    Within the past 12 months, the food you bought just didn't last and you didn't have money to get more.: Never true  Transportation Needs: No Transportation Needs (09/18/2023)   Received from [facility] - Transportation    In the past 12 months, has lack of transportation kept you from medical appointments or from getting medications?: No    In the past 12 months, has lack of transportation  kept you from meetings, work, or from getting things needed for daily living?: No  Physical Activity: Inactive (09/18/2023)   Received from [facility]   Exercise Vital Sign    On average, how many days per week do you engage in moderate to strenuous exercise (like a brisk walk)?: 0 days    Minutes of Exercise per Session: Not on file  Stress: No Stress Concern Present (09/18/2023)   Received from [facility] - Occupational Stress Questionnaire    Do you feel stress - tense, restless, nervous, or anxious, or unable to sleep at night because your mind is troubled all the time - these days?: Not at all  Social Connections: Socially Integrated (09/18/2023)   Received from [facility]   Social Network    How would you rate your social network (family, work, friends)?: Good participation with social networks  Intimate Partner Violence: Not At Risk (09/18/2023)   Received from [facility]   HITS    Over the last 12 months how often did your partner physically hurt you?: Never    Over the last 12 months how often did your partner insult you or talk down to you?: Never    Over the last 12 months how often did your partner threaten you with physical harm?: Never    Over the last 12 months how often did your partner scream or curse at you?: Never    Allergies  Allergen Reactions   Sulfa Drugs Cross Reactors Nausea And Vomiting   Tetanus-Diphth-Acell Pertussis Swelling    Significant local reaction   Codeine Nausea And Vomiting   Metformin And Related Diarrhea    Current Outpatient Medications  Medication Sig Dispense Refill   albuterol  (PROVENTIL  HFA;VENTOLIN  HFA) 108 (90 Base) MCG/ACT inhaler Inhale 1-2 puffs into the lungs every 6 (six) hours as needed for wheezing or shortness of breath. 1 each 0   aspirin  81 MG tablet Take 81 mg by mouth daily.     atorvastatin  (LIPITOR) 10 MG tablet Take 1 tablet (10 mg total) by mouth daily. 30 tablet 11    B Complex-C (B-COMPLEX WITH VITAMIN C) tablet Take 1 tablet by mouth daily.     clopidogrel  (PLAVIX ) 75 MG tablet Take 1 tablet (75 mg total) by mouth daily. 30 tablet 11   escitalopram (LEXAPRO) 5 MG tablet Take 5 mg by mouth daily. (Patient not taking: Reported on 01/10/2024)     olmesartan (BENICAR) 20 MG tablet Take 10 mg by mouth daily.     rOPINIRole  (REQUIP ) 0.5 MG tablet Take 0.5 mg by mouth daily.     trolamine  salicylate (ASPERCREME) 10 % cream Apply 1 Application topically daily as needed for muscle pain.     No current facility-administered medications for this visit.    REVIEW OF SYSTEMS:  [X]  denotes positive finding, [ ]  denotes negative finding Cardiac  Comments:  Chest pain or chest pressure:    Shortness of breath upon exertion:    Short of breath when lying flat:    Irregular heart rhythm:        Vascular    Pain in calf, thigh, or hip brought on by ambulation:    Pain in feet at night that wakes you up from your sleep:     Blood clot in your veins:    Leg swelling:         Pulmonary    Oxygen at home:    Productive cough:     Wheezing:         Neurologic    Sudden weakness in arms or legs:     Sudden numbness in arms or legs:     Sudden onset of difficulty speaking or slurred speech:    Temporary loss of vision in one eye:     Problems with dizziness:         Gastrointestinal    Blood in stool:     Vomited blood:         Genitourinary    Burning when urinating:     Blood in urine:        Psychiatric    Major depression:         Hematologic    Bleeding problems:    Problems with blood clotting too easily:        Skin    Rashes or ulcers:        Constitutional    Fever or chills:      PHYSICAL EXAM: There were no vitals filed for this visit.  GENERAL: The patient is a well-nourished female, in no acute distress. The vital signs are documented above. CARDIAC: There is a regular rate and rhythm.  VASCULAR: Fullness in the right groin  with palpable femoral pulse and right PT palpable ABDOMEN: Soft and non-tender.  DATA:     ARTERIAL PSEUDOANEURYSM    Patient Name:  GENECIS VELEY  Date of Exam:   02/04/2024 Medical Rec #: [id number]     Accession #:    [id number] Date of Birth: 1935/02/02      Patient Gender: F Patient Age:   26 years Exam Location:  [address] Procedure:      VAS US  GROIN PSEUDOANEURYSM Referring Phys: LONNI GASKINS   --------------------------------------------------------------------------- -----   Exam: Right groin Indications: Patient complains of groin pain and palpable knot. History: S/p catheterization. Limitations: None.  Comparison Study: None.  Performing Technologist: Garnette Rockers    Examination Guidelines: A complete evaluation includes B-mode imaging, spectral Doppler, color Doppler, and power Doppler as needed of all accessible portions of each vessel. Bilateral testing is considered an integral part of a complete examination. Limited examinations for reoccurring indications may be performed as noted.  +------------+----------+---------+------+----------+ Right DuplexPSV (cm/s)Waveform PlaqueComment(s) +------------+----------+---------+------+----------+ Ext.Iliac      211    triphasic      to-and-fro +------------+----------+---------+------+----------+ CFA            163    biphasic                  +------------+----------+---------+------+----------+ PFA  153    biphasic                  +------------+----------+---------+------+----------+ Prox SFA       126    biphasic                  +------------+----------+---------+------+----------+     Findings:  An area with well defined borders measuring 2.5 cm x 5.4 cm was visualized arising off of the CFA MID with ultrasound characteristics of a pseudoaneurysm. Mixed echos within the structure suggest that it is partially thrombosed with a residual diameter of  2.5 cm x 2.6 cm. The neck measures approximately 0.3 cm wide and 0.7 cm long.    Summary: Patrially thrombosed pseudoaneurysm seen rising off of the Mid CFA.    Assessment/Plan:  89 y.o. female, with multiple risk factors that presents for triage visit due to a right groin pseudoaneurysm.  Most recent underwent ultrasound-guided access right common femoral artery on 01/16/2024 with SMA stenting for chronic mesenteric ischemia.  Was in the ED on Friday night at [facility] and had a CT for some abdominal pain and noted right femoral pseudoaneurysm.  I offered transfer to [facility] but patient wanted to be discharged.  I am seeing her in the office today for triage visit.  I have reviewed her duplex today that shows fairly long neck and a partially thrombosed pseudoaneurysm in the groin.  Will attempt thrombin  injection tomorrow and discuss she be n.p.o. in case we have to go to the OR.  She is pretty adamant against any OR trip and hopefully we can get this resolved with thrombin  injection.  Skin is intact and she has a palpable pulse.  I discussed her SMA stent is patent on CTA.  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  She has no abdominal pain on evaluation today.  Aspirin  Plavix  as we discussed for this nonflow-limiting dissection.  Eating is better since SMA stenting which is great.   Lonni DOROTHA Gaskins, MD [facility] Office: [phone]

## 2024-02-05 ENCOUNTER — Encounter (HOSPITAL_COMMUNITY): Payer: Self-pay

## 2024-02-05 ENCOUNTER — Other Ambulatory Visit (HOSPITAL_COMMUNITY): Payer: Self-pay | Admitting: Cardiology

## 2024-02-05 ENCOUNTER — Other Ambulatory Visit: Payer: Self-pay

## 2024-02-05 ENCOUNTER — Ambulatory Visit (HOSPITAL_COMMUNITY): Admit: 2024-02-05 | Admitting: Vascular Surgery

## 2024-02-05 ENCOUNTER — Ambulatory Visit (HOSPITAL_COMMUNITY)
Admission: RE | Admit: 2024-02-05 | Discharge: 2024-02-05 | Disposition: A | Discharge status: Home / Self Care | Source: Ambulatory Visit | Attending: Vascular Surgery | Admitting: Vascular Surgery

## 2024-02-05 VITALS — BP 174/62 | HR 82 | Temp 98.3°F | Resp 17 | Ht 60.0 in | Wt 92.0 lb

## 2024-02-05 DIAGNOSIS — I9789 Other postprocedural complications and disorders of the circulatory system, not elsewhere classified: Secondary | ICD-10-CM | POA: Diagnosis not present

## 2024-02-05 DIAGNOSIS — I724 Aneurysm of artery of lower extremity: Secondary | ICD-10-CM | POA: Insufficient documentation

## 2024-02-05 DIAGNOSIS — I739 Peripheral vascular disease, unspecified: Secondary | ICD-10-CM | POA: Insufficient documentation

## 2024-02-05 DIAGNOSIS — T81718A Complication of other artery following a procedure, not elsewhere classified, initial encounter: Secondary | ICD-10-CM

## 2024-02-05 SURGERY — PERIPHERAL VASCULAR THROMBECTOMY
Anesthesia: LOCAL

## 2024-02-05 MED ORDER — SODIUM CHLORIDE 0.9 % IV SOLN: 250.0000 mL | INTRAVENOUS | Status: AC | PRN

## 2024-02-05 MED ORDER — SODIUM CHLORIDE 0.9% FLUSH
3.0000 mL | INTRAVENOUS | Status: DC | PRN
  Filled 2024-02-05: qty 3

## 2024-02-05 MED ORDER — THROMBIN FOR PERCUTANEOUS TREATMENT OF PSEUDOANEURYSM (5000UNITS/10ML)
5000.0000 [IU] | Freq: Once | PERCUTANEOUS | Status: AC
  Filled 2024-02-05: qty 1

## 2024-02-05 MED ORDER — FENTANYL CITRATE (PF) 50 MCG/ML IJ SOSY
25.0000 ug | PREFILLED_SYRINGE | Freq: Once | INTRAMUSCULAR | Status: DC
  Filled 2024-02-05: qty 1

## 2024-02-05 MED ORDER — SODIUM CHLORIDE 0.9% FLUSH: 3.0000 mL | INTRAVENOUS | Status: AC | PRN

## 2024-02-05 MED ORDER — LIDOCAINE HCL (PF) 1 % IJ SOLN
30.0000 mL | Freq: Once | INTRAMUSCULAR | Status: AC
  Administered 2024-02-05: 30 mL via INTRADERMAL
  Filled 2024-02-05: qty 30

## 2024-02-05 MED ORDER — SODIUM CHLORIDE 0.9 % IV SOLN
250.0000 mL | INTRAVENOUS | Status: DC | PRN
  Filled 2024-02-05: qty 250

## 2024-02-05 MED ORDER — SODIUM CHLORIDE 0.9% FLUSH: 3.0000 mL | Freq: Two times a day (BID) | INTRAVENOUS | Status: AC

## 2024-02-05 MED ORDER — THROMBIN FOR PERCUTANEOUS TREATMENT OF PSEUDOANEURYSM (5000UNITS/10ML)
5000.0000 [IU] | Freq: Once | PERCUTANEOUS | Status: AC
  Administered 2024-02-05: 5000 [IU] via PERCUTANEOUS
  Filled 2024-02-05: qty 1

## 2024-02-05 MED ORDER — LIDOCAINE HCL (PF) 1 % IJ SOLN: 30.0000 mL | Freq: Once | INTRAMUSCULAR | Status: AC

## 2024-02-05 MED ORDER — SODIUM CHLORIDE 0.9% FLUSH
3.0000 mL | Freq: Two times a day (BID) | INTRAVENOUS | Status: DC
  Filled 2024-02-05: qty 3

## 2024-02-05 MED ORDER — THROMBIN FOR PERCUTANEOUS TREATMENT OF PSEUDOANEURYSM (5000UNITS/10ML)
5000.0000 [IU] | Freq: Once | PERCUTANEOUS | Status: AC
  Filled 2024-02-05 (×3): qty 1

## 2024-02-05 MED ORDER — FENTANYL CITRATE (PF) 100 MCG/2ML IJ SOLN: 25.0000 ug | Freq: Once | INTRAMUSCULAR | Status: AC

## 2024-02-05 NOTE — Progress Notes (Signed)
Discharge instructions given to patient and daughter. Verbalized understanding.

## 2024-02-05 NOTE — H&P (Signed)
 Patient seen and examined in preop holding.  No complaints. No changes to medication history or physical exam since last seen in clinic. After discussing the risks and benefits of right femoral pseudoaneurysm thrombin  injection, Linda Ray elected to proceed.   Fonda FORBES Rim MD   Patient name: Linda Ray MRN: [id number] DOB: 01-25-1935 Sex: female  REASON FOR CONSULT: Triage, right groin pseudoaneurysm  HPI: Linda Ray is a 89 y.o. female, with multiple risk factors that presents for triage visit due to a right groin pseudoaneurysm.  Most recent underwent ultrasound-guided access right common femoral artery on 01/16/2024 with SMA stenting for chronic mesenteric ischemia.  Was in the ED on Friday night at [facility] and had a CT for some abdominal pain and noted right femoral pseudoaneurysm.  States she is eating better.  Does have swelling in the groin but is not painful.  Past Medical History:  Diagnosis Date   AAA (abdominal aortic aneurysm)    Anemia    Arthritis    Borderline diabetes    Breast cancer (HCC)    CHF (congestive heart failure) (HCC)    Chronic back pain    COPD (chronic obstructive pulmonary disease) (HCC)    Diabetes mellitus without complication (HCC)    Hypertension    Peripheral vascular disease    RLS (restless legs syndrome)     Past Surgical History:  Procedure Laterality Date   ABDOMINAL AORTOGRAM W/LOWER EXTREMITY N/A 01/16/2024   Procedure: ABDOMINAL AORTOGRAM W/LOWER EXTREMITY;  Surgeon: Gretta Lonni PARAS, MD;  Location: [facility];  Service: Cardiovascular;  Laterality: N/A;   ABDOMINAL SURGERY  1954   AORTIC ARCH ANGIOGRAPHY N/A 04/14/2020   Procedure: AORTIC ARCH ANGIOGRAPHY;  Surgeon: Gretta Lonni PARAS, MD;  Location: [facility];  Service: Cardiovascular;  Laterality: N/A;   BACK SURGERY  2009   BREAST SURGERY     COLONOSCOPY     [facility]   MASTECTOMY Left 2007   MASTECTOMY     TONSILLECTOMY     VISCERAL  ANGIOGRAPHY N/A 01/16/2024   Procedure: VISCERAL ANGIOGRAPHY;  Surgeon: Gretta Lonni PARAS, MD;  Location: [facility];  Service: Cardiovascular;  Laterality: N/A;   VISCERAL ARTERY INTERVENTION N/A 01/16/2024   Procedure: VISCERAL ARTERY INTERVENTION;  Surgeon: Gretta Lonni PARAS, MD;  Location: [facility];  Service: Cardiovascular;  Laterality: N/A;    Family History  Problem Relation Age of Onset   Colon cancer Mother        age 28   Stroke Father    Cancer Sister        breast, bladder, kidney    SOCIAL HISTORY: Social History   Socioeconomic History   Marital status: Widowed    Spouse name: Not on file   Number of children: 2   Years of education: Not on file   Highest education level: Not on file  Occupational History   Occupation: retired  Tobacco Use   Smoking status: Every Day    Current packs/day: 1.00    Average packs/day: 1 pack/day for 65.0 years (65.0 ttl pk-yrs)    Types: Cigarettes   Smokeless tobacco: Never   Tobacco comments:    currently smoking 1ppd 07/30/2016  Vaping Use   Vaping status: Never Used  Substance and Sexual Activity   Alcohol use: Yes    Comment: occasionally   Drug use: No   Sexual activity: Not Currently  Other Topics Concern   Not  on file  Social History Narrative   Current Smoker, smoking 21-30 Cigarettes Daily   Alcohol, Yes - rare         Social Drivers of Health   Financial Resource Strain: Low Risk (09/18/2023)   Received from [facility]   Overall Financial Resource Strain (CARDIA)    How hard is it for you to pay for the very basics like food, housing, medical care, and heating?: Not hard at all  Food Insecurity: No Food Insecurity (09/18/2023)   Received from [facility]   Hunger Vital Sign    Within the past 12 months, you worried that your food would run out before you got the money to buy more.: Never true    Within the past 12 months, the food you bought just didn't last and you didn't have  money to get more.: Never true  Transportation Needs: No Transportation Needs (09/18/2023)   Received from [facility] - Transportation    In the past 12 months, has lack of transportation kept you from medical appointments or from getting medications?: No    In the past 12 months, has lack of transportation kept you from meetings, work, or from getting things needed for daily living?: No  Physical Activity: Inactive (09/18/2023)   Received from [facility]   Exercise Vital Sign    On average, how many days per week do you engage in moderate to strenuous exercise (like a brisk walk)?: 0 days    Minutes of Exercise per Session: Not on file  Stress: No Stress Concern Present (09/18/2023)   Received from [facility] - Occupational Stress Questionnaire    Do you feel stress - tense, restless, nervous, or anxious, or unable to sleep at night because your mind is troubled all the time - these days?: Not at all  Social Connections: Socially Integrated (09/18/2023)   Received from [facility]   Social Network    How would you rate your social network (family, work, friends)?: Good participation with social networks  Intimate Partner Violence: Not At Risk (09/18/2023)   Received from [facility]   HITS    Over the last 12 months how often did your partner physically hurt you?: Never    Over the last 12 months how often did your partner insult you or talk down to you?: Never    Over the last 12 months how often did your partner threaten you with physical harm?: Never    Over the last 12 months how often did your partner scream or curse at you?: Never    Allergies  Allergen Reactions   Sulfa Drugs Cross Reactors Nausea And Vomiting   Tetanus-Diphth-Acell Pertussis Swelling    Significant local reaction   Codeine Nausea And Vomiting   Metformin And Related Diarrhea    Current Facility-Administered Medications  Medication Dose Route  Frequency Provider Last Rate Last Admin   0.9 %  sodium chloride  infusion  250 mL Intravenous PRN Gretta Lonni PARAS, MD       fentaNYL  (SUBLIMAZE ) injection 25-50 mcg  25-50 mcg Intravenous Once Gretta Lonni PARAS, MD       lidocaine  (PF) (XYLOCAINE ) 1 % injection 30 mL  30 mL Intradermal Once Gretta Lonni PARAS, MD       sodium chloride  flush (NS) 0.9 % injection 3 mL  3 mL Intravenous Q12H Gretta Lonni PARAS, MD       sodium chloride  flush (NS)  0.9 % injection 3 mL  3 mL Intravenous PRN Gretta Lonni PARAS, MD       thrombin  5,000 units for percutaneous treatment of pseudoaneurysm  5,000 Units Percutaneous Once Gretta Lonni PARAS, MD       thrombin  5,000 units for percutaneous treatment of pseudoaneurysm  5,000 Units Percutaneous Once Gretta Lonni PARAS, MD       Current Outpatient Medications  Medication Sig Dispense Refill   albuterol  (PROVENTIL  HFA;VENTOLIN  HFA) 108 (90 Base) MCG/ACT inhaler Inhale 1-2 puffs into the lungs every 6 (six) hours as needed for wheezing or shortness of breath. 1 each 0   aspirin  81 MG tablet Take 81 mg by mouth daily.     atorvastatin  (LIPITOR) 10 MG tablet Take 1 tablet (10 mg total) by mouth daily. 30 tablet 11   B Complex-C (B-COMPLEX WITH VITAMIN C) tablet Take 1 tablet by mouth daily.     clopidogrel  (PLAVIX ) 75 MG tablet Take 1 tablet (75 mg total) by mouth daily. 30 tablet 11   escitalopram (LEXAPRO) 5 MG tablet Take 5 mg by mouth daily. (Patient not taking: Reported on 02/04/2024)     olmesartan (BENICAR) 20 MG tablet Take 10 mg by mouth daily.     rOPINIRole  (REQUIP ) 0.5 MG tablet Take 0.5 mg by mouth daily.     trolamine salicylate (ASPERCREME) 10 % cream Apply 1 Application topically daily as needed for muscle pain.     Facility-Administered Medications Ordered in Other Encounters  Medication Dose Route Frequency Provider Last Rate Last Admin   0.9 %  sodium chloride  infusion  250 mL Intravenous PRN Gretta Lonni PARAS, MD        fentaNYL  (SUBLIMAZE ) injection 25-50 mcg  25-50 mcg Intravenous Once Gretta Lonni PARAS, MD       lidocaine  (PF) (XYLOCAINE ) 1 % injection 30 mL  30 mL Intradermal Once Gretta Lonni PARAS, MD       sodium chloride  flush (NS) 0.9 % injection 3 mL  3 mL Intravenous Q12H Gretta Lonni PARAS, MD       sodium chloride  flush (NS) 0.9 % injection 3 mL  3 mL Intravenous PRN Gretta Lonni PARAS, MD       thrombin  5,000 units for percutaneous treatment of pseudoaneurysm  5,000 Units Percutaneous Once Gretta Lonni PARAS, MD        REVIEW OF SYSTEMS:  [X]  denotes positive finding, [ ]  denotes negative finding Cardiac  Comments:  Chest pain or chest pressure:    Shortness of breath upon exertion:    Short of breath when lying flat:    Irregular heart rhythm:        Vascular    Pain in calf, thigh, or hip brought on by ambulation:    Pain in feet at night that wakes you up from your sleep:     Blood clot in your veins:    Leg swelling:         Pulmonary    Oxygen at home:    Productive cough:     Wheezing:         Neurologic    Sudden weakness in arms or legs:     Sudden numbness in arms or legs:     Sudden onset of difficulty speaking or slurred speech:    Temporary loss of vision in one eye:     Problems with dizziness:         Gastrointestinal    Blood in stool:     Vomited  blood:         Genitourinary    Burning when urinating:     Blood in urine:        Psychiatric    Major depression:         Hematologic    Bleeding problems:    Problems with blood clotting too easily:        Skin    Rashes or ulcers:        Constitutional    Fever or chills:      PHYSICAL EXAM: There were no vitals filed for this visit.  GENERAL: The patient is a well-nourished female, in no acute distress. The vital signs are documented above. CARDIAC: There is a regular rate and rhythm.  VASCULAR: Fullness in the right groin with palpable femoral pulse and right PT palpable ABDOMEN:  Soft and non-tender.  DATA:     ARTERIAL PSEUDOANEURYSM    Patient Name:  Linda Ray  Date of Exam:   02/04/2024 Medical Rec #: [id number]     Accession #:    [id number] Date of Birth: 1934/12/22      Patient Gender: F Patient Age:   4 years Exam Location:  [address] Procedure:      VAS US  QUILLIAN CAPES Referring Phys: LONNI GASKINS   --------------------------------------------------------------------------- -----   Exam: Right groin Indications: Patient complains of groin pain and palpable knot. History: S/p catheterization. Limitations: None.  Comparison Study: None.  Performing Technologist: Garnette Rockers    Examination Guidelines: A complete evaluation includes B-mode imaging, spectral Doppler, color Doppler, and power Doppler as needed of all accessible portions of each vessel. Bilateral testing is considered an integral part of a complete examination. Limited examinations for reoccurring indications may be performed as noted.  +------------+----------+---------+------+----------+ Right DuplexPSV (cm/s)Waveform PlaqueComment(s) +------------+----------+---------+------+----------+ Ext.Iliac      211    triphasic      to-and-fro +------------+----------+---------+------+----------+ CFA            163    biphasic                  +------------+----------+---------+------+----------+ PFA            153    biphasic                  +------------+----------+---------+------+----------+ Prox SFA       126    biphasic                  +------------+----------+---------+------+----------+     Findings:  An area with well defined borders measuring 2.5 cm x 5.4 cm was visualized arising off of the CFA MID with ultrasound characteristics of a pseudoaneurysm. Mixed echos within the structure suggest that it is partially thrombosed with a residual diameter of 2.5 cm x 2.6 cm. The neck measures approximately 0.3 cm wide  and 0.7 cm long.    Summary: Patrially thrombosed pseudoaneurysm seen rising off of the Mid CFA.    Assessment/Plan:  89 y.o. female, with multiple risk factors that presents for triage visit due to a right groin pseudoaneurysm.  Most recent underwent ultrasound-guided access right common femoral artery on 01/16/2024 with SMA stenting for chronic mesenteric ischemia.  Was in the ED on Friday night at [facility] and had a CT for some abdominal pain and noted right femoral pseudoaneurysm.  I offered transfer to [facility] but patient wanted to be discharged.  I am seeing her in the office today for triage visit.  I have  reviewed her duplex today that shows fairly long neck and a partially thrombosed pseudoaneurysm in the groin.  Will attempt thrombin  injection tomorrow and discuss she be n.p.o. in case we have to go to the OR.  She is pretty adamant against any OR trip and hopefully we can get this resolved with thrombin  injection.  Skin is intact and she has a palpable pulse.  I discussed her SMA stent is patent on CTA.  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  She has no abdominal pain on evaluation today.  Aspirin  Plavix  as we discussed for this nonflow-limiting dissection.  Eating is better since SMA stenting which is great.   Lonni DOROTHA Gaskins, MD [facility] Office: [phone]

## 2024-02-05 NOTE — Addendum Note (Signed)
 Addended by: BARBARAANN DELON SAILOR on: 02/05/2024 09:17 AM   Modules accepted: Orders

## 2024-02-05 NOTE — Op Note (Signed)
° ° °  NAME: Linda Ray    MRN: [id number] DOB: [birth year]    DATE OF OPERATION: 02/05/2024  PREOP DIAGNOSIS:    Right femoral artery pseudoaneurysm  POSTOP DIAGNOSIS:    Same  PROCEDURE:    Thrombin  injection of right femoral artery pseudoaneurysm  SURGEON: Fonda FORBES Rim  ASSIST: None  ANESTHESIA: None  EBL: 2 mL  INDICATIONS:    Linda Ray is a 89 y.o. female with prior history of mesenteric revascularization now status post stenting.  She presented to the outpatient office with pulsatility in the right groin.  Ultrasound demonstrated pseudoaneurysm.  This was confirmed with CT scan.  After discussing risks and benefits of pseudoaneurysm injection to prevent continued growth, Linda Ray elected to proceed.  FINDINGS:   3 x 3 cm pseudoaneurysm with a 7 mm neck coming off of the right common femoral artery. 3000 units of thrombus injected resulting in thrombosis.  Flow confirmed distally.  TECHNIQUE:   Patient was prepped and draped in standard fashion.  A timeout was performed. 5000 units of thrombin  was reconstituted and 5 mL of normal saline per directions. An ultrasound-guided micropuncture needle was used to access the right common femoral pseudoaneurysm.  There was excellent backbleeding.  Thrombin  was injected slowly under color-flow Doppler which demonstrated occlusion of the right common femoral artery pseudoaneurysm.  Flow was confirmed in the common femoral artery, superficial femoral artery, profunda, and also in the dorsalis pedis and posterior tibial arteries.  The needle was removed without issue.  Impression: Successful thrombosis of right common femoral artery pseudoaneurysm using 3000 units of thrombin .  Fonda FORBES Rim, MD [facility] DATE OF DICTATION:   02/05/2024

## 2024-02-05 NOTE — Discharge Instructions (Addendum)
 Pseudoaneurysm: Thrombin  injection  Monitor injection site for signs of infection.  Call the [facility] office with questions or concerns.   No changes to medications today.

## 2024-02-05 NOTE — Progress Notes (Signed)
 Procedure (pseudoaneurysm) finished at 1340. Gretta, MD instructed RN to observe patient for 30 minutes then patient may discharge home.

## 2024-02-07 ENCOUNTER — Other Ambulatory Visit: Payer: Self-pay | Admitting: *Deleted

## 2024-02-07 DIAGNOSIS — I724 Aneurysm of artery of lower extremity: Secondary | ICD-10-CM

## 2024-02-24 NOTE — Progress Notes (Unsigned)
 " Office Note     CC:  follow up Requesting Provider:  Chinita Hoy CROME, PA-C  HPI: Linda Ray is a 89 y.o. (1934/09/10) female who presents for follow up of mesenteric stenosis. She recently underwent Mesenteric Angiogram on 01/16/24 by Dr. Gretta with SMA angioplasty and stenting. 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 She had developed a pseudoaneurysm of her right common femoral that was successfully treated with thrombin  injection on 02/05/24.   Today she reports overall doing very well. She says her appetite has returned to normal. she says she is eating everything in sight. However her weight has not really changed. She denies any abdominal pain or back pain. Her right groin she says occasionally still has some twinge like pains but much better than it was. She does not report any food fear, post prandial pain, nausea or vomiting. She is medically managed on Aspirin , Statin, Plavix . Continues to smoke 1ppd, not interested in quitting.  Past Medical History:  Diagnosis Date   AAA (abdominal aortic aneurysm)    Anemia    Arthritis    Borderline diabetes    Breast cancer (HCC)    CHF (congestive heart failure) (HCC)    Chronic back pain    COPD (chronic obstructive pulmonary disease) (HCC)    Diabetes mellitus without complication (HCC)    Hypertension    Peripheral vascular disease    RLS (restless legs syndrome)     Past Surgical History:  Procedure Laterality Date   ABDOMINAL AORTOGRAM W/LOWER EXTREMITY N/A 01/16/2024   Procedure: ABDOMINAL AORTOGRAM W/LOWER EXTREMITY;  Surgeon: Gretta Lonni PARAS, MD;  Location: [facility];  Service: Cardiovascular;  Laterality: N/A;   ABDOMINAL SURGERY  1954   AORTIC ARCH ANGIOGRAPHY N/A 04/14/2020   Procedure: AORTIC ARCH ANGIOGRAPHY;  Surgeon: Gretta Lonni PARAS, MD;  Location: [facility];  Service: Cardiovascular;  Laterality: N/A;    BACK SURGERY  2009   BREAST SURGERY     COLONOSCOPY     [facility]   MASTECTOMY Left 2007   MASTECTOMY     TONSILLECTOMY     VISCERAL ANGIOGRAPHY N/A 01/16/2024   Procedure: VISCERAL ANGIOGRAPHY;  Surgeon: Gretta Lonni PARAS, MD;  Location: [facility];  Service: Cardiovascular;  Laterality: N/A;   VISCERAL ARTERY INTERVENTION N/A 01/16/2024   Procedure: VISCERAL ARTERY INTERVENTION;  Surgeon: Gretta Lonni PARAS, MD;  Location: [facility];  Service: Cardiovascular;  Laterality: N/A;    Social History   Socioeconomic History   Marital status: Widowed    Spouse name: Not on file   Number of children: 2   Years of education: Not on file   Highest education level: Not on file  Occupational History   Occupation: retired  Tobacco Use   Smoking status: Every Day    Current packs/day: 1.00    Average packs/day: 1 pack/day for 65.0 years (65.0 ttl pk-yrs)    Types: Cigarettes   Smokeless tobacco: Never   Tobacco comments:    currently smoking 1ppd 07/30/2016  Vaping Use   Vaping status: Never Used  Substance and Sexual Activity   Alcohol use: Yes    Comment: occasionally   Drug use: No   Sexual activity: Not Currently  Other Topics Concern   Not on file  Social History Narrative   Current Smoker, smoking 21-30 Cigarettes Daily   Alcohol, Yes - rare  Social Drivers of Health   Tobacco Use: High Risk (02/04/2024)   Patient History    Smoking Tobacco Use: Every Day    Smokeless Tobacco Use: Never    Passive Exposure: Not on file  Financial Resource Strain: Low Risk (09/18/2023)   Received from [facility]   Overall Financial Resource Strain (CARDIA)    How hard is it for you to pay for the very basics like food, housing, medical care, and heating?: Not hard at all  Food Insecurity: No Food Insecurity (09/18/2023)   Received from [facility]   Epic    Within the past 12 months, you worried that your food would run out before you got the money to  buy more.: Never true    Within the past 12 months, the food you bought just didn't last and you didn't have money to get more.: Never true  Transportation Needs: No Transportation Needs (09/18/2023)   Received from [facility]    In the past 12 months, has lack of transportation kept you from medical appointments or from getting medications?: No    In the past 12 months, has lack of transportation kept you from meetings, work, or from getting things needed for daily living?: No  Physical Activity: Inactive (09/18/2023)   Received from [facility]   Exercise Vital Sign    On average, how many days per week do you engage in moderate to strenuous exercise (like a brisk walk)?: 0 days    Minutes of Exercise per Session: Not on file  Stress: No Stress Concern Present (09/18/2023)   Received from [facility] - Occupational Stress Questionnaire    Do you feel stress - tense, restless, nervous, or anxious, or unable to sleep at night because your mind is troubled all the time - these days?: Not at all  Social Connections: Socially Integrated (09/18/2023)   Received from [facility]   Social Network    How would you rate your social network (family, work, friends)?: Good participation with social networks  Intimate Partner Violence: Not At Risk (09/18/2023)   Received from [facility]   HITS    Over the last 12 months how often did your partner physically hurt you?: Never    Over the last 12 months how often did your partner insult you or talk down to you?: Never    Over the last 12 months how often did your partner threaten you with physical harm?: Never    Over the last 12 months how often did your partner scream or curse at you?: Never  Depression (PHQ2-9): Not on file  Alcohol Screen: Not on file  Housing: Low Risk (09/18/2023)   Received from [facility]    In the last 12 months, was there a time when you were not able to pay  the mortgage or rent on time?: No    In the past 12 months, how many times have you moved where you were living?: 1    At any time in the past 12 months, were you homeless or living in a shelter (including now)?: No  Utilities: Not At Risk (09/18/2023)   Received from [facility]    In the past 12 months has the electric, gas, oil, or water company threatened to shut off services in your home?: No  Health Literacy: Not on file    Family History  Problem Relation Age  of Onset   Colon cancer Mother        age 69   Stroke Father    Cancer Sister        breast, bladder, kidney    Current Outpatient Medications  Medication Sig Dispense Refill   albuterol  (PROVENTIL  HFA;VENTOLIN  HFA) 108 (90 Base) MCG/ACT inhaler Inhale 1-2 puffs into the lungs every 6 (six) hours as needed for wheezing or shortness of breath. 1 each 0   aspirin  81 MG tablet Take 81 mg by mouth daily.     atorvastatin  (LIPITOR) 10 MG tablet Take 1 tablet (10 mg total) by mouth daily. 30 tablet 11   B Complex-C (B-COMPLEX WITH VITAMIN C) tablet Take 1 tablet by mouth daily.     clopidogrel  (PLAVIX ) 75 MG tablet Take 1 tablet (75 mg total) by mouth daily. 30 tablet 11   escitalopram (LEXAPRO) 5 MG tablet Take 5 mg by mouth daily. (Patient not taking: Reported on 02/04/2024)     olmesartan (BENICAR) 20 MG tablet Take 10 mg by mouth daily.     rOPINIRole  (REQUIP ) 0.5 MG tablet Take 0.5 mg by mouth daily.     trolamine salicylate (ASPERCREME) 10 % cream Apply 1 Application topically daily as needed for muscle pain.     No current facility-administered medications for this visit.   Facility-Administered Medications Ordered in Other Visits  Medication Dose Route Frequency Provider Last Rate Last Admin   fentaNYL  (SUBLIMAZE ) injection 25-50 mcg  25-50 mcg Intravenous Once Gretta Lonni PARAS, MD       lidocaine  (PF) (XYLOCAINE ) 1 % injection 30 mL  30 mL Intradermal Once Gretta Lonni PARAS, MD       sodium chloride   flush (NS) 0.9 % injection 3 mL  3 mL Intravenous Q12H Gretta Lonni PARAS, MD       sodium chloride  flush (NS) 0.9 % injection 3 mL  3 mL Intravenous PRN Gretta Lonni PARAS, MD       thrombin  5,000 units for percutaneous treatment of pseudoaneurysm  5,000 Units Percutaneous Once Gretta Lonni PARAS, MD       thrombin  5,000 units for percutaneous treatment of pseudoaneurysm  5,000 Units Percutaneous Once Gretta Lonni PARAS, MD        Allergies[1]   REVIEW OF SYSTEMS:  Negative unless noted in HPI [X]  denotes positive finding, [ ]  denotes negative finding Cardiac  Comments:  Chest pain or chest pressure:    Shortness of breath upon exertion:    Short of breath when lying flat:    Irregular heart rhythm:        Vascular    Pain in calf, thigh, or hip brought on by ambulation:    Pain in feet at night that wakes you up from your sleep:     Blood clot in your veins:    Leg swelling:         Pulmonary    Oxygen at home:    Productive cough:     Wheezing:         Neurologic    Sudden weakness in arms or legs:     Sudden numbness in arms or legs:     Sudden onset of difficulty speaking or slurred speech:    Temporary loss of vision in one eye:     Problems with dizziness:         Gastrointestinal    Blood in stool:     Vomited blood:  Genitourinary    Burning when urinating:     Blood in urine:        Psychiatric    Major depression:         Hematologic    Bleeding problems:    Problems with blood clotting too easily:        Skin    Rashes or ulcers:        Constitutional    Fever or chills:      PHYSICAL EXAMINATION:  Vitals:   02/25/24 1112  BP: (!) 140/78  Pulse: 71  Temp: 98 F (36.7 C)    General:  WDWN in NAD; vital signs documented above Gait: Normal HENT: WNL, normocephalic Pulmonary: normal non-labored breathing Cardiac: regular HR Abdomen: soft, NT, non distended Vascular Exam/Pulses: 2+ femoral pulses bilaterally Extremities:  without ischemic changes, without Gangrene , without cellulitis; without open wounds;  Musculoskeletal: no muscle wasting or atrophy  Neurologic: A&O X 3 Psychiatric:  The pt has Normal affect.   Non-Invasive Vascular Imaging:   VAS US  lower extremity pseudoaneurysm Right: Summary:  The 3.19 x 1.75 cm pseudoandurysm is closed with no color flow or Doppler signal. Biphasic flow in the CFA, profunda and proximal SFA  Mesenteric Limited: Summary:  Mesenteric: SMA stent appears patent. Unable to evaluate celiac.    ASSESSMENT/PLAN:: 89 y.o. female here for follow up for chronic mesenteric ischemia. She recently underwent Mesenteric Angiogram on 01/16/24 by Dr. Gretta with SMA angioplasty and stenting. 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  She had developed a pseudoaneurysm of her right common femoral that was successfully treated with thrombin  injection on 02/05/24. Her groin is improved and she is without any abdominal pain or back pain. No food fear, post prandial pain, nausea or vomiting.  - Duplex shows thrombosed pseudoaneurysm of right common femoral - Duplex shows patent SMA stent and celiac appears patent although poorly visualized - encourage smoking cessation although she is not interested in quitting -continue Aspirin  and Plavix  -she will follow up in 3 months with repeat mesenteric duplex   Teretha Damme, PA-C [facility] [phone]  Clinic MD:   [facility]      [1]  Allergies Allergen Reactions   Sulfa Drugs Cross Reactors Nausea And Vomiting   Tetanus-Diphth-Acell Pertussis Swelling    Significant local reaction   Codeine Nausea And Vomiting   Metformin And Related Diarrhea   "

## 2024-02-25 ENCOUNTER — Ambulatory Visit (HOSPITAL_COMMUNITY)
Admission: RE | Admit: 2024-02-25 | Discharge: 2024-02-25 | Disposition: A | Discharge status: Home / Self Care | Source: Ambulatory Visit | Attending: Vascular Surgery | Admitting: Vascular Surgery

## 2024-02-25 ENCOUNTER — Ambulatory Visit (HOSPITAL_BASED_OUTPATIENT_CLINIC_OR_DEPARTMENT_OTHER)
Admission: RE | Admit: 2024-02-25 | Discharge: 2024-02-25 | Disposition: A | Discharge status: Home / Self Care | Source: Ambulatory Visit | Attending: Vascular Surgery | Admitting: Vascular Surgery

## 2024-02-25 ENCOUNTER — Ambulatory Visit (INDEPENDENT_AMBULATORY_CARE_PROVIDER_SITE_OTHER)

## 2024-02-25 VITALS — BP 140/78 | HR 71 | Temp 98.0°F

## 2024-02-25 DIAGNOSIS — I724 Aneurysm of artery of lower extremity: Secondary | ICD-10-CM

## 2024-02-25 DIAGNOSIS — K551 Chronic vascular disorders of intestine: Secondary | ICD-10-CM | POA: Diagnosis not present

## 2024-03-04 ENCOUNTER — Other Ambulatory Visit: Payer: Self-pay

## 2024-03-04 DIAGNOSIS — K551 Chronic vascular disorders of intestine: Secondary | ICD-10-CM

## 2024-06-02 ENCOUNTER — Ambulatory Visit (HOSPITAL_COMMUNITY)

## 2024-06-02 ENCOUNTER — Ambulatory Visit
